# Patient Record
Sex: Female | Born: 1950
Health system: Southern US, Community
[De-identification: ages and names within clinical notes are randomized; demographics above are authoritative.]

## PROBLEM LIST (undated history)

## (undated) DIAGNOSIS — I10 Essential (primary) hypertension: Secondary | ICD-10-CM

## (undated) DIAGNOSIS — D649 Anemia, unspecified: Secondary | ICD-10-CM

## (undated) DIAGNOSIS — F419 Anxiety disorder, unspecified: Secondary | ICD-10-CM

## (undated) DIAGNOSIS — F32A Depression, unspecified: Secondary | ICD-10-CM

## (undated) DIAGNOSIS — E785 Hyperlipidemia, unspecified: Secondary | ICD-10-CM

## (undated) DIAGNOSIS — I2510 Atherosclerotic heart disease of native coronary artery without angina pectoris: Secondary | ICD-10-CM

## (undated) DIAGNOSIS — N183 Chronic kidney disease, stage 3 unspecified: Secondary | ICD-10-CM

## (undated) DIAGNOSIS — M1990 Unspecified osteoarthritis, unspecified site: Secondary | ICD-10-CM

## (undated) DIAGNOSIS — I5189 Other ill-defined heart diseases: Secondary | ICD-10-CM

## (undated) DIAGNOSIS — R011 Cardiac murmur, unspecified: Secondary | ICD-10-CM

## (undated) DIAGNOSIS — I319 Disease of pericardium, unspecified: Secondary | ICD-10-CM

## (undated) DIAGNOSIS — Z87442 Personal history of urinary calculi: Secondary | ICD-10-CM

## (undated) DIAGNOSIS — G4700 Insomnia, unspecified: Secondary | ICD-10-CM

## (undated) HISTORY — DX: Hypercalcemia: E83.52

## (undated) HISTORY — DX: Hyperlipidemia, unspecified: E78.5

## (undated) HISTORY — DX: Atherosclerotic heart disease of native coronary artery without angina pectoris: I25.10

## (undated) HISTORY — DX: Unspecified osteoarthritis, unspecified site: M19.90

## (undated) HISTORY — PX: EYE SURGERY: SHX253

## (undated) HISTORY — DX: Chronic kidney disease, stage 3 unspecified: N18.30

## (undated) HISTORY — DX: Disease of pericardium, unspecified: I31.9

## (undated) HISTORY — DX: Other ill-defined heart diseases: I51.89

## (undated) HISTORY — DX: Insomnia, unspecified: G47.00

## (undated) HISTORY — PX: TONSILLECTOMY: SUR1361

## (undated) HISTORY — DX: Anxiety disorder, unspecified: F41.9

## (undated) HISTORY — PX: LAPAROSCOPIC ENDOMETRIOSIS FULGURATION: SUR769

---
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
## 1979-03-18 HISTORY — PX: VAGINAL HYSTERECTOMY: SUR661

## 1989-03-17 HISTORY — PX: OTHER SURGICAL HISTORY: SHX169

## 1995-03-18 HISTORY — PX: BREAST CYST EXCISION: SHX579

## 1998-06-05 ENCOUNTER — Other Ambulatory Visit: Admission: RE | Admit: 1998-06-05 | Discharge: 1998-06-05 | Payer: Self-pay | Admitting: Gynecology

## 1999-07-02 ENCOUNTER — Other Ambulatory Visit: Admission: RE | Admit: 1999-07-02 | Discharge: 1999-07-02 | Payer: Self-pay | Admitting: Gynecology

## 2000-07-14 ENCOUNTER — Other Ambulatory Visit: Admission: RE | Admit: 2000-07-14 | Discharge: 2000-07-14 | Payer: Self-pay | Admitting: Gynecology

## 2001-09-08 ENCOUNTER — Other Ambulatory Visit: Admission: RE | Admit: 2001-09-08 | Discharge: 2001-09-08 | Payer: Self-pay | Admitting: Gynecology

## 2001-09-22 ENCOUNTER — Encounter: Admission: RE | Admit: 2001-09-22 | Discharge: 2001-09-22 | Payer: Self-pay | Admitting: Gynecology

## 2001-09-22 ENCOUNTER — Encounter: Payer: Self-pay | Admitting: Gynecology

## 2002-09-29 ENCOUNTER — Other Ambulatory Visit: Admission: RE | Admit: 2002-09-29 | Discharge: 2002-09-29 | Payer: Self-pay | Admitting: Gynecology

## 2003-09-26 ENCOUNTER — Other Ambulatory Visit: Admission: RE | Admit: 2003-09-26 | Discharge: 2003-09-26 | Payer: Self-pay | Admitting: Gynecology

## 2003-11-23 ENCOUNTER — Encounter: Admission: RE | Admit: 2003-11-23 | Discharge: 2003-11-23 | Payer: Self-pay | Admitting: Gynecology

## 2004-10-14 ENCOUNTER — Other Ambulatory Visit: Admission: RE | Admit: 2004-10-14 | Discharge: 2004-10-14 | Payer: Self-pay | Admitting: Gynecology

## 2005-06-16 ENCOUNTER — Encounter: Admission: RE | Admit: 2005-06-16 | Discharge: 2005-06-16 | Payer: Self-pay | Admitting: Gynecology

## 2005-11-06 ENCOUNTER — Other Ambulatory Visit: Admission: RE | Admit: 2005-11-06 | Discharge: 2005-11-06 | Payer: Self-pay | Admitting: Gynecology

## 2008-02-04 ENCOUNTER — Encounter: Admission: RE | Admit: 2008-02-04 | Discharge: 2008-02-04 | Payer: Self-pay | Admitting: Gynecology

## 2008-02-16 ENCOUNTER — Other Ambulatory Visit: Admission: RE | Admit: 2008-02-16 | Discharge: 2008-02-16 | Payer: Self-pay | Admitting: Gynecology

## 2008-02-18 ENCOUNTER — Encounter: Admission: RE | Admit: 2008-02-18 | Discharge: 2008-02-18 | Payer: Self-pay | Admitting: Gynecology

## 2009-03-26 ENCOUNTER — Encounter: Admission: RE | Admit: 2009-03-26 | Discharge: 2009-03-26 | Payer: Self-pay | Admitting: Gynecology

## 2010-04-23 ENCOUNTER — Other Ambulatory Visit: Payer: Self-pay | Admitting: Gynecology

## 2010-04-23 DIAGNOSIS — Z1231 Encounter for screening mammogram for malignant neoplasm of breast: Secondary | ICD-10-CM

## 2010-05-13 ENCOUNTER — Ambulatory Visit
Admission: RE | Admit: 2010-05-13 | Discharge: 2010-05-13 | Disposition: A | Discharge status: Home / Self Care | Payer: 59 | Source: Ambulatory Visit | Attending: Gynecology | Admitting: Gynecology

## 2010-05-13 DIAGNOSIS — Z1231 Encounter for screening mammogram for malignant neoplasm of breast: Secondary | ICD-10-CM

## 2010-08-13 ENCOUNTER — Other Ambulatory Visit (INDEPENDENT_AMBULATORY_CARE_PROVIDER_SITE_OTHER): Payer: Self-pay | Admitting: Surgery

## 2010-08-26 ENCOUNTER — Ambulatory Visit (HOSPITAL_COMMUNITY): Payer: 59

## 2010-08-26 ENCOUNTER — Encounter (HOSPITAL_COMMUNITY)
Admission: RE | Admit: 2010-08-26 | Discharge: 2010-08-26 | Disposition: A | Discharge status: Home / Self Care | Payer: 59 | Source: Ambulatory Visit | Attending: Surgery | Admitting: Surgery

## 2010-08-26 DIAGNOSIS — E213 Hyperparathyroidism, unspecified: Secondary | ICD-10-CM | POA: Insufficient documentation

## 2010-08-26 MED ORDER — TECHNETIUM TC 99M SESTAMIBI - CARDIOLITE
25.0000 | Freq: Once | INTRAVENOUS | Status: AC | PRN
  Administered 2010-08-26: 08:00:00 25 via INTRAVENOUS

## 2010-09-12 ENCOUNTER — Telehealth (INDEPENDENT_AMBULATORY_CARE_PROVIDER_SITE_OTHER): Payer: Self-pay | Admitting: Surgery

## 2010-09-16 ENCOUNTER — Telehealth (INDEPENDENT_AMBULATORY_CARE_PROVIDER_SITE_OTHER): Payer: Self-pay | Admitting: Surgery

## 2010-09-16 NOTE — Telephone Encounter (Signed)
Telephone call today to the patient. I reviewed the results of her nuclear medicine scan and her recent laboratory studies. All of these are consistent with primary hyperparathyroidism. Nuclear medicine study localized a parathyroid adenoma to the left inferior position. After discussion, the patient would like to proceed with minimally invasive surgery. I believe we can perform this as an outpatient. I will pass the orders along to the scheduling staff and we will arrange for surgery in the near future.

## 2010-10-28 ENCOUNTER — Other Ambulatory Visit (INDEPENDENT_AMBULATORY_CARE_PROVIDER_SITE_OTHER): Payer: Self-pay | Admitting: Surgery

## 2010-10-28 ENCOUNTER — Ambulatory Visit (HOSPITAL_COMMUNITY)
Admission: RE | Admit: 2010-10-28 | Discharge: 2010-10-28 | Disposition: A | Discharge status: Home / Self Care | Payer: 59 | Source: Ambulatory Visit | Attending: Surgery | Admitting: Surgery

## 2010-10-28 ENCOUNTER — Encounter (HOSPITAL_COMMUNITY): Payer: 59

## 2010-10-28 DIAGNOSIS — Z01810 Encounter for preprocedural cardiovascular examination: Secondary | ICD-10-CM | POA: Insufficient documentation

## 2010-10-28 DIAGNOSIS — Z01818 Encounter for other preprocedural examination: Secondary | ICD-10-CM

## 2010-10-28 DIAGNOSIS — R9431 Abnormal electrocardiogram [ECG] [EKG]: Secondary | ICD-10-CM | POA: Insufficient documentation

## 2010-10-28 DIAGNOSIS — Z01812 Encounter for preprocedural laboratory examination: Secondary | ICD-10-CM | POA: Insufficient documentation

## 2010-10-28 LAB — CBC
HCT: 41.6 % (ref 36.0–46.0)
Hemoglobin: 13.5 g/dL (ref 12.0–15.0)
MCH: 31.8 pg (ref 26.0–34.0)
MCHC: 32.5 g/dL (ref 30.0–36.0)
MCV: 97.9 fL (ref 78.0–100.0)
Platelets: 216 10*3/uL (ref 150–400)
RBC: 4.25 MIL/uL (ref 3.87–5.11)
RDW: 15.4 % (ref 11.5–15.5)
WBC: 12 10*3/uL — ABNORMAL HIGH (ref 4.0–10.5)

## 2010-10-28 LAB — URINE MICROSCOPIC-ADD ON

## 2010-10-28 LAB — URINALYSIS, ROUTINE W REFLEX MICROSCOPIC
Bilirubin Urine: NEGATIVE
Glucose, UA: NEGATIVE mg/dL
Hgb urine dipstick: NEGATIVE
Ketones, ur: NEGATIVE mg/dL
Nitrite: NEGATIVE
Protein, ur: NEGATIVE mg/dL
Specific Gravity, Urine: 1.022 (ref 1.005–1.030)
Urobilinogen, UA: 1 mg/dL (ref 0.0–1.0)
pH: 7.5 (ref 5.0–8.0)

## 2010-10-28 LAB — BASIC METABOLIC PANEL
BUN: 15 mg/dL (ref 6–23)
CO2: 29 mEq/L (ref 19–32)
Calcium: 11.9 mg/dL — ABNORMAL HIGH (ref 8.4–10.5)
Chloride: 108 mEq/L (ref 96–112)
Creatinine, Ser: 0.7 mg/dL (ref 0.50–1.10)
GFR calc Af Amer: >60 mL/min (ref >60)
GFR calc non Af Amer: >60 mL/min (ref >60)
Glucose, Bld: 101 mg/dL — ABNORMAL HIGH (ref 70–99)
Potassium: 4 mEq/L (ref 3.5–5.1)
Sodium: 144 mEq/L (ref 135–145)

## 2010-10-28 LAB — DIFFERENTIAL
Basophils Absolute: 0.1 10*3/uL (ref 0.0–0.1)
Basophils Relative: 0 % (ref 0–1)
Eosinophils Absolute: 0.3 10*3/uL (ref 0.0–0.7)
Eosinophils Relative: 3 % (ref 0–5)
Lymphocytes Relative: 30 % (ref 12–46)
Lymphs Abs: 3.5 10*3/uL (ref 0.7–4.0)
Monocytes Absolute: 1.1 10*3/uL — ABNORMAL HIGH (ref 0.1–1.0)
Monocytes Relative: 9 % (ref 3–12)
Neutro Abs: 7 10*3/uL (ref 1.7–7.7)
Neutrophils Relative %: 58 % (ref 43–77)

## 2010-10-28 LAB — PROTIME-INR
INR: 0.92 (ref 0.00–1.49)
Prothrombin Time: 12.6 seconds (ref 11.6–15.2)

## 2010-10-28 LAB — SURGICAL PCR SCREEN
MRSA, PCR: NEGATIVE
Staphylococcus aureus: NEGATIVE

## 2010-11-01 ENCOUNTER — Ambulatory Visit (HOSPITAL_COMMUNITY)
Admission: RE | Admit: 2010-11-01 | Discharge: 2010-11-01 | Disposition: A | Discharge status: Home / Self Care | Payer: 59 | Source: Ambulatory Visit | Attending: Surgery | Admitting: Surgery

## 2010-11-01 ENCOUNTER — Other Ambulatory Visit (INDEPENDENT_AMBULATORY_CARE_PROVIDER_SITE_OTHER): Payer: Self-pay | Admitting: Surgery

## 2010-11-01 DIAGNOSIS — Z01818 Encounter for other preprocedural examination: Secondary | ICD-10-CM | POA: Insufficient documentation

## 2010-11-01 DIAGNOSIS — M810 Age-related osteoporosis without current pathological fracture: Secondary | ICD-10-CM | POA: Insufficient documentation

## 2010-11-01 DIAGNOSIS — E210 Primary hyperparathyroidism: Secondary | ICD-10-CM

## 2010-11-01 HISTORY — PX: PARATHYROIDECTOMY: SHX19

## 2010-11-04 NOTE — Op Note (Signed)
  NAMEAMARII, Maureen NO.:  [id number]  MEDICAL RECORD NO.:  [id number]  LOCATION:  DAYL                         FACILITY:  [facility]  PHYSICIAN:  Velora Heckler, MD      DATE OF BIRTH:  01-Feb-1951  DATE OF PROCEDURE:11/01/2010                               OPERATIVE REPORT   PREOPERATIVE DIAGNOSIS:  Primary hyperparathyroidism.  POSTOPERATIVE DIAGNOSIS:  Primary hyperparathyroidism.  PROCEDURE:  Left inferior minimally invasive parathyroidectomy.  SURGEON:  Velora Heckler, MD, FACS  ANESTHESIA:  General.  ESTIMATED BLOOD LOSS:  Minimal.  PREPARATION:  ChloraPrep.  COMPLICATIONS:  None.  INDICATIONS:  The patient is a 60 year old white female from [place], [place].  She was found to have evidence of osteoporosis on bone density scanning.  Laboratory studies showed an elevated serum calcium level ranging from 11.4 to 11.9.  Intact parathyroid hormone level was elevated at 162.  The patient underwent a nuclear medicine parathyroid scan, which localized a parathyroid adenoma to the left inferior position.  The patient now comes to surgery for parathyroidectomy.  BODY OF REPORT:  Procedure was done in OR #1 at the [facility]  The patient was brought to the operating room, placed in supine position on the operating room table.  Following administration of general anesthesia, the patient was positioned and then prepped and draped in the usual strict aseptic fashion.  After ascertaining that an adequate level of anesthesia had been achieved, a 3- cm neck incision was made anteriorly.  Dissection was carried into the subcutaneous tissues.  Subplatysmal flaps were elevated.  Left external jugular vein was divided between hemostats and ligated with 2-0 silk ties.  Strap muscles were incised in the midline and reflected to the left.  Left thyroid lobe was gently mobilized.  Venous tributaries were divided between medium Ligaclips.   Exploration in the tracheoesophageal groove reveals the recurrent laryngeal nerve, which was avoided and preserved.  Posterior to the nerve along the lateral aspect of the esophagus, is an enlarged parathyroid gland.  This was gently dissected out.  Vascular pedicles divided between small Ligaclips.  Gland was excised in its entirety.  It is submitted in its entirety to pathology for frozen section.  Pathologist confirms parathyroid tissue consistent with parathyroid adenoma.  Good hemostasis was achieved.  Surgicel was placed in the operative field.  Strap muscles were reapproximated in the midline with interrupted 3-0 Vicryl sutures.  Platysma was closed with interrupted 3- 0 Vicryl sutures.  Skin was anesthetized with local anesthetic.  Skin edges were reapproximated with a running 4-0 Monocryl subcuticular suture.  Wound was washed and dried and Benzoin and Steri-Strips were applied.  Sterile dressings were applied.  The patient was awakened from anesthesia and brought to the recovery room.  The patient tolerated the procedure well.   Velora Heckler, MD, FACS     TMG/MEDQ  D:  11/01/2010  T:  11/01/2010  Job:  [id number]  cc:   Leatha Gilding. Mezer, M.D. Fax: [phone]  Ancil Boozer, MD Fax: [phone]  Electronically Signed by Darnell Level MD on 11/04/2010 02:28:13 PM

## 2010-11-22 ENCOUNTER — Other Ambulatory Visit (INDEPENDENT_AMBULATORY_CARE_PROVIDER_SITE_OTHER): Payer: Self-pay | Admitting: Surgery

## 2010-11-22 LAB — CALCIUM: Calcium: 9.1 mg/dL (ref 8.4–10.5)

## 2010-11-26 ENCOUNTER — Encounter (INDEPENDENT_AMBULATORY_CARE_PROVIDER_SITE_OTHER): Payer: Self-pay | Admitting: Surgery

## 2010-11-27 ENCOUNTER — Encounter (INDEPENDENT_AMBULATORY_CARE_PROVIDER_SITE_OTHER): Payer: Self-pay | Admitting: Surgery

## 2010-11-27 ENCOUNTER — Ambulatory Visit (INDEPENDENT_AMBULATORY_CARE_PROVIDER_SITE_OTHER): Payer: 59 | Admitting: Surgery

## 2010-11-27 VITALS — BP 138/66 | HR 72

## 2010-11-27 DIAGNOSIS — E210 Primary hyperparathyroidism: Secondary | ICD-10-CM | POA: Insufficient documentation

## 2010-11-27 NOTE — Patient Instructions (Signed)
  COCOA BUTTER & VITAMIN E CREAM  ([employer] or other brand)  Apply cocoa butter/vitamin E cream to your incision 2 - 3 times daily.  Massage cream into incision for one minute with each application.  Use sunscreen (50 SPF or higher) for first 6 months after surgery.  You may substitute Mederma or other scar reducing creams as desired.   

## 2010-11-27 NOTE — Progress Notes (Signed)
Visit Diagnoses: 1. Hyperparathyroidism, primary     HISTORY: Patient returns for followup having undergone minimally invasive parathyroidectomy 3 weeks ago. Followup serum calcium level is normal at 9.1. Patient has no specific complaints.   EXAM: Surgical wound is healing nicely without complication. Minimal soft tissue swelling. His quality is normal.   IMPRESSION: Primary hyperparathyroidism, normalization of serum calcium level following parathyroidectomy   PLAN: Patient will begin applying topical creams to her incision. She will have a calcium level and intact PTH level checked in 5 weeks. She will return in 6 weeks for a final wound check.   Velora Heckler, MD, FACS General & Endocrine Surgery [facility]

## 2011-01-03 ENCOUNTER — Other Ambulatory Visit (INDEPENDENT_AMBULATORY_CARE_PROVIDER_SITE_OTHER): Payer: Self-pay | Admitting: Surgery

## 2011-01-06 LAB — PTH, INTACT AND CALCIUM
Calcium, Total (PTH): 10 mg/dL (ref 8.4–10.5)
PTH: 40.4 pg/mL (ref 14.0–72.0)

## 2011-01-07 ENCOUNTER — Encounter (INDEPENDENT_AMBULATORY_CARE_PROVIDER_SITE_OTHER): Payer: Self-pay | Admitting: Surgery

## 2011-01-08 ENCOUNTER — Ambulatory Visit (INDEPENDENT_AMBULATORY_CARE_PROVIDER_SITE_OTHER): Payer: 59 | Admitting: Surgery

## 2011-01-08 ENCOUNTER — Encounter (INDEPENDENT_AMBULATORY_CARE_PROVIDER_SITE_OTHER): Payer: Self-pay | Admitting: Surgery

## 2011-01-08 VITALS — BP 136/80 | HR 60 | Temp 97.2°F | Resp 20 | Ht 64.0 in | Wt 125.5 lb

## 2011-01-08 DIAGNOSIS — E210 Primary hyperparathyroidism: Secondary | ICD-10-CM

## 2011-01-08 NOTE — Patient Instructions (Signed)
  COCOA BUTTER & VITAMIN E CREAM  ([employer] or other brand)  Apply cocoa butter/vitamin E cream to your incision 2 - 3 times daily.  Massage cream into incision for one minute with each application.  Use sunscreen (50 SPF or higher) for first 6 months after surgery.  You may substitute Mederma or other scar reducing creams as desired.   

## 2011-01-08 NOTE — Progress Notes (Signed)
Visit Diagnoses: 1. Hyperparathyroidism, primary     HISTORY: Patient returns for final postoperative visit having undergone minimally invasive parathyroidectomy. Final laboratory showed an intact PTH level of 40 and a total serum calcium level of 10.0. These are down from her preoperative levels of PTH 162 and calcium of 11.9.   EXAM: Surgical wound is nicely healed. No complication. Cosmetic result is acceptable.   IMPRESSION: Status post minimally invasive parathyroidectomy for primary hyperparathyroidism   PLAN: Patient will continue to apply topical creams to her incision. She will return to see me as needed. She should have an annual calcium level determination as followup.   Velora Heckler, MD, FACS General & Endocrine Surgery [facility]

## 2011-02-20 ENCOUNTER — Other Ambulatory Visit: Payer: Self-pay | Admitting: Surgery

## 2013-11-16 ENCOUNTER — Other Ambulatory Visit: Payer: Self-pay | Admitting: Gynecology

## 2013-11-17 LAB — CYTOLOGY - PAP

## 2014-06-26 ENCOUNTER — Ambulatory Visit
Admission: RE | Admit: 2014-06-26 | Discharge: 2014-06-26 | Disposition: A | Discharge status: Home / Self Care | Payer: BLUE CROSS/BLUE SHIELD | Source: Ambulatory Visit | Attending: Family Medicine | Admitting: Family Medicine

## 2014-06-26 ENCOUNTER — Other Ambulatory Visit: Payer: Self-pay | Admitting: Family Medicine

## 2014-06-26 DIAGNOSIS — R05 Cough: Secondary | ICD-10-CM

## 2014-06-26 DIAGNOSIS — R059 Cough, unspecified: Secondary | ICD-10-CM

## 2014-12-05 ENCOUNTER — Other Ambulatory Visit: Payer: Self-pay

## 2014-12-11 ENCOUNTER — Ambulatory Visit
Admission: RE | Admit: 2014-12-11 | Discharge: 2014-12-11 | Disposition: A | Discharge status: Home / Self Care | Payer: BLUE CROSS/BLUE SHIELD | Source: Ambulatory Visit | Attending: Physical Medicine and Rehabilitation | Admitting: Physical Medicine and Rehabilitation

## 2014-12-11 ENCOUNTER — Other Ambulatory Visit: Payer: Self-pay | Admitting: Physical Medicine and Rehabilitation

## 2014-12-11 DIAGNOSIS — M25511 Pain in right shoulder: Secondary | ICD-10-CM

## 2014-12-11 DIAGNOSIS — M79642 Pain in left hand: Secondary | ICD-10-CM

## 2014-12-11 DIAGNOSIS — M79641 Pain in right hand: Secondary | ICD-10-CM

## 2014-12-11 DIAGNOSIS — M25512 Pain in left shoulder: Secondary | ICD-10-CM

## 2015-01-02 ENCOUNTER — Encounter: Payer: Self-pay | Admitting: Acute Care

## 2015-01-24 ENCOUNTER — Encounter: Payer: Self-pay | Admitting: Acute Care

## 2015-04-27 ENCOUNTER — Telehealth: Payer: Self-pay | Admitting: Acute Care

## 2015-04-27 NOTE — Telephone Encounter (Signed)
Patient self referred herself for this program.  We have spoke to patient 4x's. She keeps pushing this appt back each month for [place] to contact her next month. Informed patient to contact us when she was ready and available to make the appt. Patient was given our number and My name to make the appt.   Nothing further needed at this time.

## 2015-08-06 ENCOUNTER — Encounter: Payer: Self-pay | Admitting: Cardiovascular Disease

## 2015-08-09 ENCOUNTER — Encounter: Payer: Self-pay | Admitting: Cardiovascular Disease

## 2015-08-09 ENCOUNTER — Ambulatory Visit (INDEPENDENT_AMBULATORY_CARE_PROVIDER_SITE_OTHER): Payer: BLUE CROSS/BLUE SHIELD | Admitting: Cardiovascular Disease

## 2015-08-09 VITALS — BP 160/90 | HR 89 | Ht 64.0 in | Wt 132.2 lb

## 2015-08-09 DIAGNOSIS — R0600 Dyspnea, unspecified: Secondary | ICD-10-CM

## 2015-08-09 DIAGNOSIS — Z7689 Persons encountering health services in other specified circumstances: Secondary | ICD-10-CM

## 2015-08-09 DIAGNOSIS — Z7189 Other specified counseling: Secondary | ICD-10-CM

## 2015-08-09 DIAGNOSIS — Z8249 Family history of ischemic heart disease and other diseases of the circulatory system: Secondary | ICD-10-CM | POA: Diagnosis not present

## 2015-08-09 DIAGNOSIS — R0789 Other chest pain: Secondary | ICD-10-CM

## 2015-08-09 NOTE — Patient Instructions (Addendum)
Medication Instructions:  Your physician recommends that you continue on your current medications as directed. Please refer to the Current Medication list given to you today.  1-STOP your Simvastatin for 1 month and then resume  Labwork: NONE  Testing/Procedures: Your physician has requested that you have an echocardiogram. Echocardiography is a painless test that uses sound waves to create images of your heart. It provides your doctor with information about the size and shape of your heart and how well your heart's chambers and valves are working. This procedure takes approximately one hour. There are no restrictions for this procedure.  Your physician has requested that you have en exercise stress myoview. For further information please visit HugeFiesta.[email or web address]. Please follow instruction sheet, as given.  Follow-Up: Your physician wants you to follow-up as needed with Dr. Johnsie Cancel.    If you need a refill on your cardiac medications before your next appointment, please call your pharmacy.

## 2015-08-09 NOTE — Progress Notes (Signed)
Patient ID: Maureen Chung, female   DOB: 1951/02/17, 65 y.o.   MRN: [id number]     Cardiology Office Note   Date:  08/09/2015   ID:  Maureen Chung, DOB Jul 15, 1950, MRN [id number]  PCP:  London Pepper, MD  Cardiologist:   Jenkins Rouge, MD   Chief Complaint  Patient presents with  . Establish Care    lack of energy & diarrhea for 3 months, per pt      History of Present Illness: Maureen Chung is a 65 y.o. female who presents for dyspnea Current 1 ppd smoker. Family history of premature CAD.  She has been having constitutional symptoms For a few months Malaise, fatiugue dyspnea some LE edema. Diarrhea  No PND , orthopnea and no syncope or palpitations. She has had some atypical chest pains Sharp fleeting radiate to left arm. Has not tried to quit smoking in long time CXR last year ok Was recommended to enroll in lung cancer screening program   Lab reviewe [facility]: Hct 37.3 Cr .91 TSH 1.97 LDL 113    Past Medical History  Diagnosis Date  . Arthritis   . Osteoporosis   . Hyperparathyroidism   . Hypercalcemia   . Hyperlipidemia   . Pericarditis   . Insomnia   . Breast cancer ([place]) 2002    s/p lumpectomy and radiation Dr. Margot Chimes  . Anxiety     Past Surgical History  Procedure Laterality Date  . Tonsilectomy, adenoidectomy, bilateral myringotomy and tubes  1969  . Laparoscopic endometriosis fulguration  1989, 1995  . Child birth  89  . Breast cyst excision  1997    benign  . Parathyroidectomy  11/01/10  . Vaginal hysterectomy  1981    without ovaries     Current Outpatient Prescriptions  Medication Sig Dispense Refill  . amphetamine-dextroamphetamine (ADDERALL) 20 MG tablet Take 20 mg by mouth daily.    Marland Kitchen ascorbic acid (VITAMIN C) 500 MG/5ML syrup Take 3,000 mg by mouth daily.    . Cholecalciferol (VITAMIN D3) 2000 units TABS Take 4,000 mg by mouth daily.    . Magnesium Oxide 500 MG CAPS Take 500 mg by mouth daily.    . Naftifine HCl 2 % CREA Apply 1  application topically daily.    . sertraline (ZOLOFT) 100 MG tablet Take 200 mg by mouth daily.    . simvastatin (ZOCOR) 10 MG tablet Take 10 mg by mouth daily.       No current facility-administered medications for this visit.    Allergies:   Codeine sulfate and Latex    Social History:  The patient  reports that she has been smoking Cigarettes.  She has a 35 pack-year smoking history. She has never used smokeless tobacco. She reports that she drinks alcohol. She reports that she does not use illicit drugs.   Family History:  The patient's family history includes Bladder Cancer in her brother; Cancer in her mother; Coronary artery disease in her brother; Diabetes in her father; Heart disease in her brother and father.    ROS:  Please see the history of present illness.   Otherwise, review of systems are positive for none.   All other systems are reviewed and negative.    PHYSICAL EXAM: VS:  BP 160/90 mmHg  Pulse 89  Ht 5\' 4"  (1.626 m)  Wt 59.966 kg (132 lb 3.2 oz)  BMI 22.68 kg/m2  SpO2 96% , BMI Body mass index is 22.68 kg/(m^2). Affect appropriate Healthy:  appears  stated age 34: normal Neck supple with no adenopathy JVP normal no bruits no thyromegaly Lungs clear with no wheezing and good diaphragmatic motion Heart:  S1/S2 no murmur, no rub, gallop or click PMI normal Abdomen: benighn, BS positve, no tenderness, no AAA no bruit.  No HSM or HJR Distal pulses intact with no bruits No edema Neuro non-focal Skin warm and dry No muscular weakness    EKG:   10/28/10 SR rate 75 LAE nonspecific ST/T wave changes  08/09/15 SR rate 81 biatrial enlargement normal    Recent Labs: No results found for requested labs within last 365 days.    Lipid Panel No results found for: CHOL, TRIG, HDL, CHOLHDL, VLDL, LDLCALC, LDLDIRECT    Wt Readings from Last 3 Encounters:  08/09/15 59.966 kg (132 lb 3.2 oz)  01/08/11 56.926 kg (125 lb 8 oz)      Other studies  Reviewed: Additional studies/ records that were reviewed today include: Primary notes labs and CXR.    ASSESSMENT AND PLAN:  1.  Chest Pain: atypical ECG ok smoker with family history f/u exercise myovue 2. Dyspnea related to smoking low dose CT for lung cancer screening should be arranged by primary  With malaise and fatigue check echo for RV/LV function 3. Chol:  Stop statin for a month and see if constitutional symptoms improve 4. ADHD: continue adderall    Current medicines are reviewed at length with the patient today.  The patient does not have concerns regarding medicines.  The following changes have been made:  Stop statin for a month   Labs/ tests ordered today include:  Echo and Ex Myovue   Orders Placed This Encounter  Procedures  . Myocardial Perfusion Imaging  . EKG 12-Lead  . ECHOCARDIOGRAM COMPLETE     Disposition:   FU with me PRN      Signed, Jenkins Rouge, MD  08/09/2015 3:07 PM    [facility] [place] [place], [place], [place]  [address] Phone: [phone]; Fax: [phone]

## 2015-08-16 ENCOUNTER — Encounter: Payer: Self-pay | Admitting: Cardiovascular Disease

## 2015-08-17 ENCOUNTER — Encounter: Payer: Self-pay | Admitting: Cardiovascular Disease

## 2015-08-24 ENCOUNTER — Other Ambulatory Visit: Payer: Self-pay | Admitting: Gastroenterology

## 2015-08-27 ENCOUNTER — Telehealth (HOSPITAL_COMMUNITY): Payer: Self-pay | Admitting: *Deleted

## 2015-08-27 NOTE — Telephone Encounter (Signed)
Patient given detailed instructions per Myocardial Perfusion Study Information Sheet for the test on 08/31/15. Patient notified to arrive 15 minutes early and that it is imperative to arrive on time for appointment to keep from having the test rescheduled.  If you need to cancel or reschedule your appointment, please call the office within 24 hours of your appointment. Failure to do so may result in a cancellation of your appointment, and a $50 no show fee. Patient verbalized understanding. Sofija Antwi J Payne Garske, RN  

## 2015-08-31 ENCOUNTER — Other Ambulatory Visit: Payer: Self-pay

## 2015-08-31 ENCOUNTER — Ambulatory Visit (HOSPITAL_COMMUNITY): Payer: BLUE CROSS/BLUE SHIELD | Attending: Internal Medicine

## 2015-08-31 ENCOUNTER — Ambulatory Visit (HOSPITAL_BASED_OUTPATIENT_CLINIC_OR_DEPARTMENT_OTHER): Payer: BLUE CROSS/BLUE SHIELD

## 2015-08-31 DIAGNOSIS — R0600 Dyspnea, unspecified: Secondary | ICD-10-CM | POA: Insufficient documentation

## 2015-08-31 DIAGNOSIS — Z720 Tobacco use: Secondary | ICD-10-CM | POA: Diagnosis not present

## 2015-08-31 DIAGNOSIS — I253 Aneurysm of heart: Secondary | ICD-10-CM | POA: Insufficient documentation

## 2015-08-31 DIAGNOSIS — R5383 Other fatigue: Secondary | ICD-10-CM | POA: Diagnosis not present

## 2015-08-31 DIAGNOSIS — R0602 Shortness of breath: Secondary | ICD-10-CM | POA: Diagnosis not present

## 2015-08-31 DIAGNOSIS — I340 Nonrheumatic mitral (valve) insufficiency: Secondary | ICD-10-CM | POA: Diagnosis not present

## 2015-08-31 DIAGNOSIS — I5189 Other ill-defined heart diseases: Secondary | ICD-10-CM | POA: Insufficient documentation

## 2015-08-31 DIAGNOSIS — Z8249 Family history of ischemic heart disease and other diseases of the circulatory system: Secondary | ICD-10-CM | POA: Diagnosis not present

## 2015-08-31 DIAGNOSIS — R0789 Other chest pain: Secondary | ICD-10-CM | POA: Diagnosis not present

## 2015-08-31 DIAGNOSIS — R9439 Abnormal result of other cardiovascular function study: Secondary | ICD-10-CM | POA: Insufficient documentation

## 2015-08-31 LAB — ECHOCARDIOGRAM COMPLETE
Ao-asc: 26 cm
E decel time: 278 msec
E/e' ratio: 10.75
FS: 27 % — AB (ref 28–44)
IVS/LV PW RATIO, ED: 1.15
LA ID, A-P, ES: 38 mm
LA diam end sys: 38 mm
LA diam index: 2.3 cm/m2
LA vol A4C: 36 ml
LA vol index: 28.5 mL/m2
LA vol: 47 mL
LV E/e' medial: 10.75
LV E/e'average: 10.75
LV PW d: 8.95 mm — AB (ref 0.6–1.1)
LV dias vol index: 43 mL/m2
LV dias vol: 71 mL (ref 46–106)
LV e' LATERAL: 7.02 cm/s
LV sys vol index: 20 mL/m2
LV sys vol: 33 mL (ref 14–42)
LVOT SV: 61 mL
LVOT VTI: 21.4 cm
LVOT area: 2.84 cm2
LVOT diameter: 19 mm
LVOT peak grad rest: 3 mmHg
LVOT peak vel: 81.2 cm/s
Lateral S' vel: 11.2 cm/s
MV Dec: 278
MV Peak grad: 2 mmHg
MV pk A vel: 102 m/s
MV pk E vel: 75.5 m/s
Simpson's disk: 54
Stroke v: 38 ml
TDI e' lateral: 7.02
TDI e' medial: 4.39

## 2015-08-31 LAB — MYOCARDIAL PERFUSION IMAGING
Estimated workload: 7 METS
Exercise duration (min): 6 min
Exercise duration (sec): 0 s
LV dias vol: 91 mL (ref 46–106)
LV sys vol: 45 mL
MPHR: 157 {beats}/min
Peak HR: 153 {beats}/min
Percent HR: 97 %
RATE: 0.26
Rest HR: 65 {beats}/min
SDS: 0
SRS: 0
SSS: 0
TID: 0.95

## 2015-08-31 MED ORDER — TECHNETIUM TC 99M TETROFOSMIN IV KIT
10.1000 | PACK | Freq: Once | INTRAVENOUS | Status: AC | PRN
Start: 2015-08-31 — End: 2015-08-31
  Administered 2015-08-31: 10.1 via INTRAVENOUS
  Filled 2015-08-31: qty 10

## 2015-08-31 MED ORDER — TECHNETIUM TC 99M TETROFOSMIN IV KIT
31.6000 | PACK | Freq: Once | INTRAVENOUS | Status: AC | PRN
  Administered 2015-08-31: 32 via INTRAVENOUS
  Filled 2015-08-31: qty 32

## 2015-09-04 ENCOUNTER — Telehealth: Payer: Self-pay

## 2015-09-04 NOTE — Telephone Encounter (Signed)
-----   Message from Josue Hector, MD sent at 08/31/2015  3:51 PM EDT ----- Low risk scan small area of apical ischemia call in SL nitro for chest pain f/u with me next available

## 2015-09-04 NOTE — Telephone Encounter (Signed)
Left message to call back  

## 2015-09-12 MED ORDER — NITROGLYCERIN 0.4 MG SL SUBL: 0.4000 mg | SUBLINGUAL_TABLET | SUBLINGUAL | Status: DC | PRN

## 2015-09-12 NOTE — Telephone Encounter (Signed)
Pt returning call to Pam-pls call [phone]

## 2015-09-12 NOTE — Telephone Encounter (Signed)
Patient aware of stress test and echo results. Patient has follow-up appointment 10/22/15 which is next available with Dr. Johnsie Cancel.

## 2015-09-26 ENCOUNTER — Other Ambulatory Visit: Payer: Self-pay | Admitting: Acute Care

## 2015-09-26 DIAGNOSIS — F17210 Nicotine dependence, cigarettes, uncomplicated: Secondary | ICD-10-CM

## 2015-10-01 ENCOUNTER — Telehealth: Payer: Self-pay | Admitting: Cardiovascular Disease

## 2015-10-01 NOTE — Telephone Encounter (Signed)
New message      Pt has an appt early august.  We called her and gave her test results.  Pt is worried and and has questions. Please call pt

## 2015-10-01 NOTE — Telephone Encounter (Signed)
Spoke with the patient. States she was recently diagnosed with mitral regurgitation and is "stressing out about it".  She is worried and thinks about it all the time. Several days last week she became sweaty and couldn't get her breath during activities--unloading the dishwasher, doing laundry, gardening. She reports chest pressure at times and has taken NTG once which helped.  PCP recently prescribed clonazepam for her anxiety.  Feet and ankles swell by the end of the day, better in the mornings.  She would like to see Dr. Johnsie Cancel sooner than 10/22/15 if possible.   Advised I would let his primary nurse know, and also scheduling if there is a cancellation.  Advised pt to keep a list of questions/concerns to bring with her or she can call back to discuss if needed.  She is appreciative for the information provided.

## 2015-10-03 ENCOUNTER — Ambulatory Visit: Payer: BLUE CROSS/BLUE SHIELD | Admitting: Nurse Practitioner

## 2015-10-03 NOTE — Telephone Encounter (Signed)
Called patient and moved her appointment to 10/09/15. Patient was very thankful and will be here on Tuesday at 9:30.

## 2015-10-08 NOTE — Progress Notes (Signed)
Patient ID: Maureen Chung, female   DOB: 06-23-1950, 65 y.o.   MRN: [id number]     Cardiology Office Note   Date:  10/09/2015   ID:  Maureen Chung, DOB March 04, 1951, MRN [id number]  PCP:  London Pepper, MD  Cardiologist:   Jenkins Rouge, MD   Chief Complaint  Patient presents with  . Follow-up      History of Present Illness: Maureen Chung is a 65 y.o. female who presents for dyspnea Current 1 ppd smoker. Family history of premature CAD.  She has been having constitutional symptoms  For a few months Malaise, fatiugue dyspnea some LE edema. Diarrhea  No PND , orthopnea and no syncope or palpitations. She has had some atypical chest pains Sharp fleeting radiate to left arm. Has not tried to quit smoking in long time CXR last year ok Was recommended to enroll in lung cancer screening program   Lab reviewe [facility]: Hct 37.3 Cr .91 TSH 1.97 LDL 113  Echo 08/31/15 Impressions:  - LVEF 50-55%, normal wall thickness, normal wall motion, diastolic   dysfunction, indeterminate LV filling pressure, mildly thickened   leaflets with elongated leaflet tips, mild to moderate mitral   regurgitation, normal LA size, aneurysmal IAS - cannot exclude   small PFO, normal IVC.  Myovue:  08/31/15 low risk    Study Highlights    Nuclear stress EF: 50%.  Upsloping ST segment depression ST segment depression of 1 mm was noted during stress in the V5, V6, III and aVF leads.  This is a low risk study.  The left ventricular ejection fraction is mildly decreased (45-54%).  Findings consistent with ischemia.   Small area of mild apical ischemia. ECG positive with 1 mm upsloping ST segment depression Surface images with EF 50% mild apical hypokinesis     Statin stopped last visit for constitutional symptoms  Feels like she may have more energy.  Continue to have SSCP has enrolled in lung cancer screeing Program.  Discussed options Given ongoing SSCP and abnormal myovue with risk  factors Favor cath Risks including stroke , MI bleeding and contrast reaction discussed Willing to proceed     Past Medical History:  Diagnosis Date  . Anxiety   . Arthritis   . Breast cancer ([place]) 2002   s/p lumpectomy and radiation Dr. Margot Chimes  . Hypercalcemia   . Hyperlipidemia   . Hyperparathyroidism   . Insomnia   . Osteoporosis   . Pericarditis     Past Surgical History:  Procedure Laterality Date  . BREAST CYST EXCISION  1997   benign  . child birth  75  . LAPAROSCOPIC ENDOMETRIOSIS FULGURATION  1989, 1995  . PARATHYROIDECTOMY  11/01/10  . TONSILECTOMY, ADENOIDECTOMY, BILATERAL MYRINGOTOMY AND TUBES  1969  . VAGINAL HYSTERECTOMY  1981   without ovaries     Current Outpatient Prescriptions  Medication Sig Dispense Refill  . amphetamine-dextroamphetamine (ADDERALL) 20 MG tablet Take 20 mg by mouth daily.    . Cholecalciferol (VITAMIN D3) 2000 units TABS Take 4,000 mg by mouth daily.    . Naftifine HCl 2 % CREA Apply 1 application topically daily.    . nitroGLYCERIN (NITROSTAT) 0.4 MG SL tablet Place 1 tablet (0.4 mg total) under the tongue every 5 (five) minutes as needed for chest pain. 25 tablet 3  . sertraline (ZOLOFT) 100 MG tablet Take 200 mg by mouth daily.     No current facility-administered medications for this visit.     Allergies:  Codeine sulfate and Latex    Social History:  The patient  reports that she has been smoking Cigarettes.  She has a 35.00 pack-year smoking history. She has never used smokeless tobacco. She reports that she drinks alcohol. She reports that she does not use drugs.   Family History:  The patient's family history includes Bladder Cancer in her brother; Cancer in her mother; Coronary artery disease in her brother; Diabetes in her father; Heart disease in her brother and father.    ROS:  Please see the history of present illness.   Otherwise, review of systems are positive for none.   All other systems are reviewed and  negative.    PHYSICAL EXAM: VS:  BP 140/68   Pulse 74   Ht 5\' 4"  (1.626 m)   Wt 131 lb 1.9 oz (59.5 kg)   BMI 22.51 kg/m  , BMI Body mass index is 22.51 kg/m. Affect appropriate Healthy:  appears stated age 31: normal Neck supple with no adenopathy JVP normal no bruits no thyromegaly Lungs clear with no wheezing and good diaphragmatic motion Heart:  S1/S2 no murmur, no rub, gallop or click PMI normal Abdomen: benighn, BS positve, no tenderness, no AAA no bruit.  No HSM or HJR Distal pulses intact with no bruits No edema Neuro non-focal Skin warm and dry No muscular weakness    EKG:   10/28/10 SR rate 75 LAE nonspecific ST/T wave changes  08/09/15 SR rate 81 biatrial enlargement normal    Recent Labs: No results found for requested labs within last 8760 hours.    Lipid Panel No results found for: CHOL, TRIG, HDL, CHOLHDL, VLDL, LDLCALC, LDLDIRECT    Wt Readings from Last 3 Encounters:  10/09/15 131 lb 1.9 oz (59.5 kg)  08/09/15 132 lb 3.2 oz (60 kg)  01/08/11 125 lb 8 oz (56.9 kg)      Other studies Reviewed: Additional studies/ records that were reviewed today include: Primary notes labs and CXR.    ASSESSMENT AND PLAN:  1.  Chest Pain:  Atypical abnormal myovue ongoing in smoker cath  Scheduled for next Thursday labs ordered Cath lab called 11:00 Orders written 2. Dyspnea related to smoking mild to moderate MR EF 50-55% will Have CT scan for cancer screeing on 9th 3. Chol:  Resume statin if she has CAD at cath  4. ADHD: continue adderall    Current medicines are reviewed at length with the patient today.  The patient does not have concerns regarding medicines.  The following changes have been made:    Disposition:   FU with me  Post cath     Signed, Jenkins Rouge, MD  10/09/2015 10:01 AM    [facility] [place], [place], [place]  [address] Phone: [phone]; Fax: [phone]

## 2015-10-09 ENCOUNTER — Ambulatory Visit (INDEPENDENT_AMBULATORY_CARE_PROVIDER_SITE_OTHER): Payer: BLUE CROSS/BLUE SHIELD | Admitting: Cardiovascular Disease

## 2015-10-09 ENCOUNTER — Encounter: Payer: Self-pay | Admitting: Cardiovascular Disease

## 2015-10-09 VITALS — BP 140/68 | HR 74 | Ht 64.0 in | Wt 131.1 lb

## 2015-10-09 DIAGNOSIS — Z01812 Encounter for preprocedural laboratory examination: Secondary | ICD-10-CM

## 2015-10-09 LAB — PROTIME-INR
INR: 0.9
Prothrombin Time: 10.1 s (ref 9.0–11.5)

## 2015-10-09 LAB — CBC WITH DIFFERENTIAL/PLATELET
Basophils Absolute: 99 cells/uL (ref 0–200)
Basophils Relative: 1 %
Eosinophils Absolute: 495 cells/uL (ref 15–500)
Eosinophils Relative: 5 %
HCT: 37.4 % (ref 35.0–45.0)
Hemoglobin: 12.2 g/dL (ref 11.7–15.5)
Lymphocytes Relative: 26 %
Lymphs Abs: 2574 cells/uL (ref 850–3900)
MCH: 31.4 pg (ref 27.0–33.0)
MCHC: 32.6 g/dL (ref 32.0–36.0)
MCV: 96.1 fL (ref 80.0–100.0)
MPV: 10.8 fL (ref 7.5–12.5)
Monocytes Absolute: 1188 cells/uL — ABNORMAL HIGH (ref 200–950)
Monocytes Relative: 12 %
Neutro Abs: 5544 cells/uL (ref 1500–7800)
Neutrophils Relative %: 56 %
Platelets: 248 10*3/uL (ref 140–400)
RBC: 3.89 MIL/uL (ref 3.80–5.10)
RDW: 15.3 % — ABNORMAL HIGH (ref 11.0–15.0)
WBC: 9.9 10*3/uL (ref 3.8–10.8)

## 2015-10-09 LAB — BRAIN NATRIURETIC PEPTIDE: Brain Natriuretic Peptide: 31.4 pg/mL (ref <100)

## 2015-10-09 MED ORDER — ASPIRIN EC 81 MG PO TBEC: 81.0000 mg | DELAYED_RELEASE_TABLET | Freq: Every day | ORAL | 3 refills | Status: DC

## 2015-10-09 NOTE — Patient Instructions (Addendum)
Medication Instructions:  Your physician has recommended you make the following change in your medication:  1-START aspirin 81 mg by mouth daily   Labwork: Your physician recommends that you have lab work today- BMET, CBC, and PT/INR  Testing/Procedures: Your physician has requested that you have a cardiac catheterization. Cardiac catheterization is used to diagnose and/or treat various heart conditions. Doctors may recommend this procedure for a number of different reasons. The most common reason is to evaluate chest pain. Chest pain can be a symptom of coronary artery disease (CAD), and cardiac catheterization can show whether plaque is narrowing or blocking your heart's arteries. This procedure is also used to evaluate the valves, as well as measure the blood flow and oxygen levels in different parts of your heart. For further information please visit HugeFiesta.[email or web address]. Please follow instruction sheet, as given.  Follow-Up: Your physician wants you to follow-up as directed after procedure.   If you need a refill on your cardiac medications before your next appointment, please call your pharmacy.

## 2015-10-11 ENCOUNTER — Other Ambulatory Visit: Payer: Self-pay | Admitting: Cardiovascular Disease

## 2015-10-11 DIAGNOSIS — I2583 Coronary atherosclerosis due to lipid rich plaque: Principal | ICD-10-CM

## 2015-10-11 DIAGNOSIS — I2510 Atherosclerotic heart disease of native coronary artery without angina pectoris: Secondary | ICD-10-CM

## 2015-10-18 ENCOUNTER — Ambulatory Visit (HOSPITAL_COMMUNITY)
Admission: RE | Admit: 2015-10-18 | Discharge: 2015-10-18 | Disposition: A | Discharge status: Home / Self Care | Payer: BLUE CROSS/BLUE SHIELD | Source: Ambulatory Visit | Attending: Cardiovascular Disease | Admitting: Cardiovascular Disease

## 2015-10-18 ENCOUNTER — Encounter (HOSPITAL_COMMUNITY): Payer: Self-pay | Admitting: *Deleted

## 2015-10-18 ENCOUNTER — Encounter (HOSPITAL_COMMUNITY)
Admission: RE | Disposition: A | Discharge status: Home / Self Care | Payer: Self-pay | Source: Ambulatory Visit | Attending: Cardiovascular Disease

## 2015-10-18 DIAGNOSIS — M810 Age-related osteoporosis without current pathological fracture: Secondary | ICD-10-CM | POA: Insufficient documentation

## 2015-10-18 DIAGNOSIS — E213 Hyperparathyroidism, unspecified: Secondary | ICD-10-CM | POA: Insufficient documentation

## 2015-10-18 DIAGNOSIS — I2583 Coronary atherosclerosis due to lipid rich plaque: Secondary | ICD-10-CM

## 2015-10-18 DIAGNOSIS — E785 Hyperlipidemia, unspecified: Secondary | ICD-10-CM | POA: Diagnosis not present

## 2015-10-18 DIAGNOSIS — F17210 Nicotine dependence, cigarettes, uncomplicated: Secondary | ICD-10-CM | POA: Insufficient documentation

## 2015-10-18 DIAGNOSIS — R079 Chest pain, unspecified: Secondary | ICD-10-CM | POA: Diagnosis not present

## 2015-10-18 DIAGNOSIS — M1990 Unspecified osteoarthritis, unspecified site: Secondary | ICD-10-CM | POA: Diagnosis not present

## 2015-10-18 DIAGNOSIS — R0789 Other chest pain: Secondary | ICD-10-CM | POA: Diagnosis present

## 2015-10-18 DIAGNOSIS — Z8249 Family history of ischemic heart disease and other diseases of the circulatory system: Secondary | ICD-10-CM | POA: Diagnosis not present

## 2015-10-18 DIAGNOSIS — I319 Disease of pericardium, unspecified: Secondary | ICD-10-CM | POA: Insufficient documentation

## 2015-10-18 DIAGNOSIS — Z8052 Family history of malignant neoplasm of bladder: Secondary | ICD-10-CM | POA: Insufficient documentation

## 2015-10-18 DIAGNOSIS — I2510 Atherosclerotic heart disease of native coronary artery without angina pectoris: Secondary | ICD-10-CM | POA: Insufficient documentation

## 2015-10-18 DIAGNOSIS — F909 Attention-deficit hyperactivity disorder, unspecified type: Secondary | ICD-10-CM | POA: Insufficient documentation

## 2015-10-18 DIAGNOSIS — Z853 Personal history of malignant neoplasm of breast: Secondary | ICD-10-CM | POA: Diagnosis not present

## 2015-10-18 DIAGNOSIS — F419 Anxiety disorder, unspecified: Secondary | ICD-10-CM | POA: Diagnosis not present

## 2015-10-18 DIAGNOSIS — Z833 Family history of diabetes mellitus: Secondary | ICD-10-CM | POA: Diagnosis not present

## 2015-10-18 DIAGNOSIS — I2584 Coronary atherosclerosis due to calcified coronary lesion: Secondary | ICD-10-CM | POA: Diagnosis not present

## 2015-10-18 DIAGNOSIS — G4700 Insomnia, unspecified: Secondary | ICD-10-CM | POA: Diagnosis not present

## 2015-10-18 HISTORY — PX: CARDIAC CATHETERIZATION: SHX172

## 2015-10-18 LAB — BASIC METABOLIC PANEL
Anion gap: 8 (ref 5–15)
BUN: 21 mg/dL — ABNORMAL HIGH (ref 6–20)
CO2: 24 mmol/L (ref 22–32)
Calcium: 9.5 mg/dL (ref 8.9–10.3)
Chloride: 110 mmol/L (ref 101–111)
Creatinine, Ser: 0.88 mg/dL (ref 0.44–1.00)
GFR calc Af Amer: >60 mL/min (ref >60)
GFR calc non Af Amer: >60 mL/min (ref >60)
Glucose, Bld: 93 mg/dL (ref 65–99)
Potassium: 4.2 mmol/L (ref 3.5–5.1)
Sodium: 142 mmol/L (ref 135–145)

## 2015-10-18 SURGERY — LEFT HEART CATH AND CORONARY ANGIOGRAPHY
Anesthesia: LOCAL

## 2015-10-18 MED ORDER — FENTANYL CITRATE (PF) 100 MCG/2ML IJ SOLN
INTRAMUSCULAR | Status: AC
  Filled 2015-10-18: qty 2

## 2015-10-18 MED ORDER — VERAPAMIL HCL 2.5 MG/ML IV SOLN
INTRAVENOUS | Status: DC | PRN
  Administered 2015-10-18: 10 mL via INTRA_ARTERIAL

## 2015-10-18 MED ORDER — ASPIRIN 81 MG PO CHEW: 81.0000 mg | CHEWABLE_TABLET | ORAL | Status: DC

## 2015-10-18 MED ORDER — SODIUM CHLORIDE 0.9 % WEIGHT BASED INFUSION: 3.0000 mL/kg/h | INTRAVENOUS | Status: DC

## 2015-10-18 MED ORDER — HEPARIN SODIUM (PORCINE) 1000 UNIT/ML IJ SOLN
INTRAMUSCULAR | Status: DC | PRN
  Administered 2015-10-18: 3500 [IU] via INTRAVENOUS

## 2015-10-18 MED ORDER — SODIUM CHLORIDE 0.9% FLUSH: 3.0000 mL | INTRAVENOUS | Status: DC | PRN

## 2015-10-18 MED ORDER — IOPAMIDOL (ISOVUE-370) INJECTION 76%
INTRAVENOUS | Status: DC | PRN
  Administered 2015-10-18: 60 mL

## 2015-10-18 MED ORDER — OXYCODONE-ACETAMINOPHEN 5-325 MG PO TABS: 1.0000 | ORAL_TABLET | ORAL | Status: DC | PRN

## 2015-10-18 MED ORDER — HEPARIN SODIUM (PORCINE) 1000 UNIT/ML IJ SOLN
INTRAMUSCULAR | Status: AC
  Filled 2015-10-18: qty 1

## 2015-10-18 MED ORDER — SODIUM CHLORIDE 0.9 % IV SOLN: 250.0000 mL | INTRAVENOUS | Status: DC | PRN

## 2015-10-18 MED ORDER — VERAPAMIL HCL 2.5 MG/ML IV SOLN
INTRAVENOUS | Status: AC
  Filled 2015-10-18: qty 2

## 2015-10-18 MED ORDER — SODIUM CHLORIDE 0.9 % WEIGHT BASED INFUSION
3.0000 mL/kg/h | INTRAVENOUS | Status: DC
  Administered 2015-10-18: 3 mL/kg/h via INTRAVENOUS

## 2015-10-18 MED ORDER — MIDAZOLAM HCL 2 MG/2ML IJ SOLN
INTRAMUSCULAR | Status: DC | PRN
  Administered 2015-10-18: 2 mg via INTRAVENOUS

## 2015-10-18 MED ORDER — FENTANYL CITRATE (PF) 100 MCG/2ML IJ SOLN
INTRAMUSCULAR | Status: DC | PRN
  Administered 2015-10-18: 50 ug via INTRAVENOUS

## 2015-10-18 MED ORDER — SODIUM CHLORIDE 0.9% FLUSH: 3.0000 mL | Freq: Two times a day (BID) | INTRAVENOUS | Status: DC

## 2015-10-18 MED ORDER — IOPAMIDOL (ISOVUE-370) INJECTION 76%
INTRAVENOUS | Status: AC
  Filled 2015-10-18: qty 100

## 2015-10-18 MED ORDER — HEPARIN (PORCINE) IN NACL 2-0.9 UNIT/ML-% IJ SOLN
INTRAMUSCULAR | Status: DC | PRN
  Administered 2015-10-18: 11:00:00

## 2015-10-18 MED ORDER — MIDAZOLAM HCL 2 MG/2ML IJ SOLN
INTRAMUSCULAR | Status: AC
  Filled 2015-10-18: qty 2

## 2015-10-18 MED ORDER — DIAZEPAM 2 MG PO TABS: 2.0000 mg | ORAL_TABLET | Freq: Four times a day (QID) | ORAL | Status: DC | PRN

## 2015-10-18 MED ORDER — SODIUM CHLORIDE 0.9 % WEIGHT BASED INFUSION: 1.0000 mL/kg/h | INTRAVENOUS | Status: DC

## 2015-10-18 MED ORDER — LIDOCAINE HCL (PF) 1 % IJ SOLN
INTRAMUSCULAR | Status: DC | PRN
  Administered 2015-10-18: 2 mL

## 2015-10-18 MED ORDER — LIDOCAINE HCL (PF) 1 % IJ SOLN
INTRAMUSCULAR | Status: AC
  Filled 2015-10-18: qty 30

## 2015-10-18 MED ORDER — HEPARIN (PORCINE) IN NACL 2-0.9 UNIT/ML-% IJ SOLN
INTRAMUSCULAR | Status: AC
  Filled 2015-10-18: qty 1000

## 2015-10-18 SURGICAL SUPPLY — 12 items
CATH INFINITI 5 FR JL3.5 (CATHETERS) ×1 IMPLANT
CATH INFINITI 5FR ANG PIGTAIL (CATHETERS) ×1 IMPLANT
CATH INFINITI JR4 5F (CATHETERS) ×1 IMPLANT
DEVICE RAD COMP TR BAND LRG (VASCULAR PRODUCTS) ×1 IMPLANT
GLIDESHEATH SLEND A-KIT 6F 22G (SHEATH) IMPLANT
GLIDESHEATH SLEND SS 6F .021 (SHEATH) ×1 IMPLANT
KIT HEART LEFT (KITS) ×2 IMPLANT
PACK CARDIAC CATHETERIZATION (CUSTOM PROCEDURE TRAY) ×2 IMPLANT
SYR MEDRAD MARK V 150ML (SYRINGE) ×2 IMPLANT
TRANSDUCER W/STOPCOCK (MISCELLANEOUS) ×2 IMPLANT
TUBING CIL FLEX 10 FLL-RA (TUBING) ×2 IMPLANT
WIRE SAFE-T 1.5MM-J .035X260CM (WIRE) ×1 IMPLANT

## 2015-10-18 NOTE — Interval H&P Note (Signed)
History and Physical Interval Note:  10/18/2015 10:13 AM  Maureen Chung  has presented today for surgery, with the diagnosis of chest pain  The various methods of treatment have been discussed with the patient and family. After consideration of risks, benefits and other options for treatment, the patient has consented to  Procedure(s): Left Heart Cath and Coronary Angiography (N/A) as a surgical intervention .  The patient's history has been reviewed, patient examined, no change in status, stable for surgery.  I have reviewed the patient's chart and labs.  Questions were answered to the patient's satisfaction.     Jenkins Rouge

## 2015-10-18 NOTE — Research (Signed)
[place] Study Informed Consent   Subject Name: Maureen Chung  Subject met inclusion and exclusion criteria.  The informed consent form, study requirements and expectations were reviewed with the subject and questions and concerns were addressed prior to the signing of the consent form.  The subject verbalized understanding of the trial requirements.  The subject agreed to participate in the trial and signed the informed consent.  The informed consent was obtained prior to performance of any protocol-specific procedures for the subject.  A copy of the signed informed consent was given to the subject and a copy was placed in the subject's medical record.  [name] 10/18/2015, 11:39 AM

## 2015-10-18 NOTE — CV Procedure (Signed)
See cath note in Epic

## 2015-10-18 NOTE — H&P (View-Only) (Signed)
Patient ID: Maureen Chung, female   DOB: [birth year], 65 y.o.   MRN: [id number]     Cardiology Office Note   Date:  10/09/2015   ID:  Maureen Chung, DOB Feb 16, 1951, MRN [id number]  PCP:  London Pepper, MD  Cardiologist:   Jenkins Rouge, MD   Chief Complaint  Patient presents with  . Follow-up      History of Present Illness: Maureen Chung is a 65 y.o. female who presents for dyspnea Current 1 ppd smoker. Family history of premature CAD.  She has been having constitutional symptoms  For a few months Malaise, fatiugue dyspnea some LE edema. Diarrhea  No PND , orthopnea and no syncope or palpitations. She has had some atypical chest pains Sharp fleeting radiate to left arm. Has not tried to quit smoking in long time CXR last year ok Was recommended to enroll in lung cancer screening program   Lab reviewe [facility]: Hct 37.3 Cr .91 TSH 1.97 LDL 113  Echo 08/31/15 Impressions:  - LVEF 50-55%, normal wall thickness, normal wall motion, diastolic   dysfunction, indeterminate LV filling pressure, mildly thickened   leaflets with elongated leaflet tips, mild to moderate mitral   regurgitation, normal LA size, aneurysmal IAS - cannot exclude   small PFO, normal IVC.  Myovue:  08/31/15 low risk    Study Highlights    Nuclear stress EF: 50%.  Upsloping ST segment depression ST segment depression of 1 mm was noted during stress in the V5, V6, III and aVF leads.  This is a low risk study.  The left ventricular ejection fraction is mildly decreased (45-54%).  Findings consistent with ischemia.   Small area of mild apical ischemia. ECG positive with 1 mm upsloping ST segment depression Surface images with EF 50% mild apical hypokinesis     Statin stopped last visit for constitutional symptoms  Feels like she may have more energy.  Continue to have SSCP has enrolled in lung cancer screeing Program.  Discussed options Given ongoing SSCP and abnormal myovue with risk  factors Favor cath Risks including stroke , MI bleeding and contrast reaction discussed Willing to proceed     Past Medical History:  Diagnosis Date  . Anxiety   . Arthritis   . Breast cancer ([place]) 2002   s/p lumpectomy and radiation Dr. Margot Chimes  . Hypercalcemia   . Hyperlipidemia   . Hyperparathyroidism   . Insomnia   . Osteoporosis   . Pericarditis     Past Surgical History:  Procedure Laterality Date  . BREAST CYST EXCISION  1997   benign  . child birth  64  . LAPAROSCOPIC ENDOMETRIOSIS FULGURATION  1989, 1995  . PARATHYROIDECTOMY  11/01/10  . TONSILECTOMY, ADENOIDECTOMY, BILATERAL MYRINGOTOMY AND TUBES  1969  . VAGINAL HYSTERECTOMY  1981   without ovaries     Current Outpatient Prescriptions  Medication Sig Dispense Refill  . amphetamine-dextroamphetamine (ADDERALL) 20 MG tablet Take 20 mg by mouth daily.    . Cholecalciferol (VITAMIN D3) 2000 units TABS Take 4,000 mg by mouth daily.    . Naftifine HCl 2 % CREA Apply 1 application topically daily.    . nitroGLYCERIN (NITROSTAT) 0.4 MG SL tablet Place 1 tablet (0.4 mg total) under the tongue every 5 (five) minutes as needed for chest pain. 25 tablet 3  . sertraline (ZOLOFT) 100 MG tablet Take 200 mg by mouth daily.     No current facility-administered medications for this visit.     Allergies:  Codeine sulfate and Latex    Social History:  The patient  reports that she has been smoking Cigarettes.  She has a 35.00 pack-year smoking history. She has never used smokeless tobacco. She reports that she drinks alcohol. She reports that she does not use drugs.   Family History:  The patient's family history includes Bladder Cancer in her brother; Cancer in her mother; Coronary artery disease in her brother; Diabetes in her father; Heart disease in her brother and father.    ROS:  Please see the history of present illness.   Otherwise, review of systems are positive for none.   All other systems are reviewed and  negative.    PHYSICAL EXAM: VS:  BP 140/68   Pulse 74   Ht 5\' 4"  (1.626 m)   Wt 131 lb 1.9 oz (59.5 kg)   BMI 22.51 kg/m  , BMI Body mass index is 22.51 kg/m. Affect appropriate Healthy:  appears stated age 78: normal Neck supple with no adenopathy JVP normal no bruits no thyromegaly Lungs clear with no wheezing and good diaphragmatic motion Heart:  S1/S2 no murmur, no rub, gallop or click PMI normal Abdomen: benighn, BS positve, no tenderness, no AAA no bruit.  No HSM or HJR Distal pulses intact with no bruits No edema Neuro non-focal Skin warm and dry No muscular weakness    EKG:   10/28/10 SR rate 75 LAE nonspecific ST/T wave changes  08/09/15 SR rate 81 biatrial enlargement normal    Recent Labs: No results found for requested labs within last 8760 hours.    Lipid Panel No results found for: CHOL, TRIG, HDL, CHOLHDL, VLDL, LDLCALC, LDLDIRECT    Wt Readings from Last 3 Encounters:  10/09/15 131 lb 1.9 oz (59.5 kg)  08/09/15 132 lb 3.2 oz (60 kg)  01/08/11 125 lb 8 oz (56.9 kg)      Other studies Reviewed: Additional studies/ records that were reviewed today include: Primary notes labs and CXR.    ASSESSMENT AND PLAN:  1.  Chest Pain:  Atypical abnormal myovue ongoing in smoker cath  Scheduled for next Thursday labs ordered Cath lab called 11:00 Orders written 2. Dyspnea related to smoking mild to moderate MR EF 50-55% will Have CT scan for cancer screeing on 9th 3. Chol:  Resume statin if she has CAD at cath  4. ADHD: continue adderall    Current medicines are reviewed at length with the patient today.  The patient does not have concerns regarding medicines.  The following changes have been made:    Disposition:   FU with me  Post cath     Signed, Jenkins Rouge, MD  10/09/2015 10:01 AM    [facility] [address], [place], [place]  [address] Phone: [phone]; Fax: [phone]

## 2015-10-18 NOTE — Discharge Instructions (Signed)
Radial Site Care °Refer to this sheet in the next few weeks. These instructions provide you with information about caring for yourself after your procedure. Your health care provider may also give you more specific instructions. Your treatment has been planned according to current medical practices, but problems sometimes occur. Call your health care provider if you have any problems or questions after your procedure. °WHAT TO EXPECT AFTER THE PROCEDURE °After your procedure, it is typical to have the following: °· Bruising at the radial site that usually fades within 1-2 weeks. °· Blood collecting in the tissue (hematoma) that may be painful to the touch. It should usually decrease in size and tenderness within 1-2 weeks. °HOME CARE INSTRUCTIONS °· Take medicines only as directed by your health care provider. °· You may shower 24-48 hours after the procedure or as directed by your health care provider. Remove the bandage (dressing) and gently wash the site with plain soap and water. Pat the area dry with a clean towel. Do not rub the site, because this may cause bleeding. °· Do not take baths, swim, or use a hot tub until your health care provider approves. °· Check your insertion site every day for redness, swelling, or drainage. °· Do not apply powder or lotion to the site. °· Do not flex or bend the affected arm for 24 hours or as directed by your health care provider. °· Do not push or pull heavy objects with the affected arm for 24 hours or as directed by your health care provider. °· Do not lift over 10 lb (4.5 kg) for 5 days after your procedure or as directed by your health care provider. °· Ask your health care provider when it is okay to: °¨ Return to work or school. °¨ Resume usual physical activities or sports. °¨ Resume sexual activity. °· Do not drive home if you are discharged the same day as the procedure. Have someone else drive you. °· You may drive 24 hours after the procedure unless otherwise  instructed by your health care provider. °· Do not operate machinery or power tools for 24 hours after the procedure. °· If your procedure was done as an outpatient procedure, which means that you went home the same day as your procedure, a responsible adult should be with you for the first 24 hours after you arrive home. °· Keep all follow-up visits as directed by your health care provider. This is important. °SEEK MEDICAL CARE IF: °· You have a fever. °· You have chills. °· You have increased bleeding from the radial site. Hold pressure on the site. CALL 911 °SEEK IMMEDIATE MEDICAL CARE IF: °· You have unusual pain at the radial site. °· You have redness, warmth, or swelling at the radial site. °· You have drainage (other than a small amount of blood on the dressing) from the radial site. °· The radial site is bleeding, and the bleeding does not stop after 30 minutes of holding steady pressure on the site. °· Your arm or hand becomes pale, cool, tingly, or numb. °  °This information is not intended to replace advice given to you by your health care provider. Make sure you discuss any questions you have with your health care provider. °  °Document Released: 04/05/2010 Document Revised: 03/24/2014 Document Reviewed: 09/19/2013 °Elsevier Interactive Patient Education ©2016 [employer] ° °

## 2015-10-22 ENCOUNTER — Ambulatory Visit: Payer: BLUE CROSS/BLUE SHIELD | Admitting: Cardiovascular Disease

## 2015-10-24 ENCOUNTER — Ambulatory Visit (INDEPENDENT_AMBULATORY_CARE_PROVIDER_SITE_OTHER): Payer: BLUE CROSS/BLUE SHIELD | Admitting: Acute Care

## 2015-10-24 ENCOUNTER — Encounter: Payer: Self-pay | Admitting: Acute Care

## 2015-10-24 ENCOUNTER — Ambulatory Visit: Admission: RE | Admit: 2015-10-24 | Payer: BLUE CROSS/BLUE SHIELD | Source: Ambulatory Visit

## 2015-10-24 DIAGNOSIS — F17210 Nicotine dependence, cigarettes, uncomplicated: Secondary | ICD-10-CM | POA: Diagnosis not present

## 2015-10-24 NOTE — Progress Notes (Signed)
Shared Decision Making Visit Lung Cancer Screening Program [phone])   Eligibility:  Age 65 y.o.  Pack Years Smoking History Calculation 31 pack years (# packs/per year x # years smoked)  Recent History of coughing up blood  no  Unexplained weight loss? no ( >Than 15 pounds within the last 6 months )  Prior History Lung / other cancer no (Diagnosis within the last 5 years already requiring surveillance chest CT Scans).  Smoking Status Current Smoker  Former Smokers: Years since quit: NA  Quit Date: NA  Visit Components:  Discussion included one or more decision making aids. yes  Discussion included risk/benefits of screening. yes  Discussion included potential follow up diagnostic testing for abnormal scans. yes  Discussion included meaning and risk of over diagnosis. yes  Discussion included meaning and risk of False Positives. yes  Discussion included meaning of total radiation exposure. yes  Counseling Included:  Importance of adherence to annual lung cancer LDCT screening. yes  Impact of comorbidities on ability to participate in the program. yes  Ability and willingness to under diagnostic treatment. yes  Smoking Cessation Counseling:  Current Smokers:   Discussed importance of smoking cessation. yes  Information about tobacco cessation classes and interventions provided to patient. yes  Patient provided with "ticket" for LDCT Scan. yes  Symptomatic Patient. no  Counseling: NA  Diagnosis Code: Tobacco Use Z72.0  Asymptomatic Patient yes  Counseling (Intermediate counseling: > three minutes counseling) [id number]  Former Smokers:   Discussed the importance of maintaining cigarette abstinence. yes  Diagnosis Code: Personal History of Nicotine Dependence. [id number]  Information about tobacco cessation classes and interventions provided to patient. Yes  Patient provided with "ticket" for LDCT Scan. yes  Written Order for Lung Cancer Screening with  LDCT placed in Epic. Yes (CT Chest Lung Cancer Screening Low Dose W/O CM) [id number] Z12.2-Screening of respiratory organs Z87.891-Personal history of nicotine dependence  I have spent 20 minutes of face to face time with Maureen Chung discussing the risks and benefits of lung cancer screening. We viewed a power point together that explained in detail the above noted topics. We paused at intervals to allow for questions to be asked and answered to ensure understanding.We discussed that the single most powerful action that she can take to decrease her risk of developing lung cancer is to quit smoking. We discussed whether or not she is ready to commit to setting a quit date. We discussed options for tools to aid in quitting smoking including nicotine replacement therapy, non-nicotine medications, support groups, Quit Smart classes, and behavior modification. We discussed that often times setting smaller, more achievable goals, such as eliminating 1 cigarette a day for a week and then 2 cigarettes a day for a week can be helpful in slowly decreasing the number of cigarettes smoked. This allows for a sense of accomplishment as well as providing a clinical benefit. I gave her the " Be Stronger Than Your Excuses" card with contact information for community resources, classes, free nicotine replacement therapy, and access to mobile apps, text messaging, and on-line smoking cessation help. I have also given her my card and contact information in the event she needs to contact me. We discussed the time and location of the scan, and that either June Leap, CMA, or I will call with the results within 24-48 hours of receiving them. I have provided her with a copy of the power point we viewed  as a resource in the event they need reinforcement of the  concepts we discussed today in the office. The patient verbalized understanding of all of  the above and had no further questions upon leaving the office. They have my contact  information in the event they have any further questions.    Maureen Spatz, Maureen Chung 10/24/2015

## 2016-01-07 ENCOUNTER — Ambulatory Visit (INDEPENDENT_AMBULATORY_CARE_PROVIDER_SITE_OTHER)
Admission: RE | Admit: 2016-01-07 | Discharge: 2016-01-07 | Disposition: A | Discharge status: Home / Self Care | Payer: BLUE CROSS/BLUE SHIELD | Source: Ambulatory Visit | Attending: Acute Care | Admitting: Acute Care

## 2016-01-07 DIAGNOSIS — Z87891 Personal history of nicotine dependence: Secondary | ICD-10-CM

## 2016-01-07 DIAGNOSIS — F17210 Nicotine dependence, cigarettes, uncomplicated: Secondary | ICD-10-CM

## 2016-01-08 ENCOUNTER — Telehealth: Payer: Self-pay | Admitting: Acute Care

## 2016-01-08 DIAGNOSIS — F17210 Nicotine dependence, cigarettes, uncomplicated: Secondary | ICD-10-CM

## 2016-01-08 NOTE — Telephone Encounter (Signed)
I have called Maureen Chung with the results of her low-dose screening CT. I explained to her that her scan was read as a Lung RADS 2: nodules that are benign in appearance and behavior with a very low likelihood of becoming a clinically active cancer due to size or lack of growth. Recommendation per radiology is for a repeat LDCT in 12 months. I told her that we will order and schedule her follow-up scan for October 2018. I also notified her that her scan had an incidental finding of age advanced coronary artery atherosclerosis. She was recently on a statin however has had a full cardiac workup including stress test, and cardiac cath. Per cath proximal LAD to mid LAD noted a 20% stenosis. Otherwise cath was without indication of disease. EF 65% with some mild calcification noted. As this patient is already being followed by cardiology, I will forward the results of the scan to her primary care provider for completeness of her medical record. [name] verbalized understanding of the above.  We had a long conversation regarding the fact that she has had excellent cardiology follow-up. We will scan her again in 12 months per radiology recommendations. [name] had no questions upon completion of the call however has my contact information in the event she has questions in the future.

## 2016-12-30 DIAGNOSIS — R899 Unspecified abnormal finding in specimens from other organs, systems and tissues: Secondary | ICD-10-CM | POA: Diagnosis not present

## 2016-12-30 DIAGNOSIS — G4700 Insomnia, unspecified: Secondary | ICD-10-CM | POA: Diagnosis not present

## 2016-12-30 DIAGNOSIS — Z720 Tobacco use: Secondary | ICD-10-CM | POA: Diagnosis not present

## 2016-12-30 DIAGNOSIS — F909 Attention-deficit hyperactivity disorder, unspecified type: Secondary | ICD-10-CM | POA: Diagnosis not present

## 2016-12-30 DIAGNOSIS — F339 Major depressive disorder, recurrent, unspecified: Secondary | ICD-10-CM | POA: Diagnosis not present

## 2016-12-30 DIAGNOSIS — F411 Generalized anxiety disorder: Secondary | ICD-10-CM | POA: Diagnosis not present

## 2017-01-02 DIAGNOSIS — F419 Anxiety disorder, unspecified: Secondary | ICD-10-CM | POA: Diagnosis not present

## 2017-01-02 DIAGNOSIS — M25562 Pain in left knee: Secondary | ICD-10-CM | POA: Diagnosis not present

## 2017-01-02 DIAGNOSIS — S8002XA Contusion of left knee, initial encounter: Secondary | ICD-10-CM | POA: Diagnosis not present

## 2017-01-02 DIAGNOSIS — S0180XA Unspecified open wound of other part of head, initial encounter: Secondary | ICD-10-CM | POA: Diagnosis not present

## 2017-01-13 ENCOUNTER — Ambulatory Visit (INDEPENDENT_AMBULATORY_CARE_PROVIDER_SITE_OTHER)
Admission: RE | Admit: 2017-01-13 | Discharge: 2017-01-13 | Disposition: A | Discharge status: Home / Self Care | Payer: Medicare Other | Source: Ambulatory Visit | Attending: Acute Care | Admitting: Acute Care

## 2017-01-13 DIAGNOSIS — F17210 Nicotine dependence, cigarettes, uncomplicated: Secondary | ICD-10-CM

## 2017-01-13 DIAGNOSIS — Z87891 Personal history of nicotine dependence: Secondary | ICD-10-CM

## 2017-01-13 NOTE — Progress Notes (Signed)
Patient ID: Maureen Chung, female   DOB: [birth year], 66 y.o.   MRN: [id number]     Cardiology Office Note   Date:  01/14/2017   ID:  Maureen Chung, DOB April 21, 1950, MRN [id number]  PCP:  London Pepper, MD  Cardiologist:   Jenkins Rouge, MD   No chief complaint on file.     History of Present Illness: Maureen Chung is a 66 y.o. female who presents for dyspnea Current 1 ppd smoker. Family history of premature CAD.  She has been having constitutional symptoms  For a few months Malaise, fatiugue dyspnea some LE edema. Diarrhea  No PND , orthopnea and no syncope or palpitations. She has had some atypical chest pains Sharp fleeting radiate to left arm. Has not tried to quit smoking in long time CXR last year ok Was recommended to enroll in lung cancer screening program   Lab [facility]: Hct 37.3 Cr .91 TSH 1.97 LDL 113  Echo 08/31/15 Impressions:  - LVEF 50-55%, normal wall thickness, normal wall motion, diastolic   dysfunction, indeterminate LV filling pressure, mildly thickened   leaflets with elongated leaflet tips, mild to moderate mitral   regurgitation, normal LA size, aneurysmal IAS - cannot exclude   small PFO, normal IVC.  Myovue:  08/31/15 low risk EF 50% small area of mild apical ischemia   Cath:  10/18/15 No significant CAD EF 65% EDP 9 mmHg   Statin stopped last visit for constitutional symptoms  Feels like she may have more energy. No chest pain chronic dyspnea from smoking   Past Medical History:  Diagnosis Date  . Anxiety   . Arthritis   . Hypercalcemia   . Hyperlipidemia   . Hyperparathyroidism   . Insomnia   . Osteoporosis   . Pericarditis     Past Surgical History:  Procedure Laterality Date  . BREAST CYST EXCISION  1997   benign  . CARDIAC CATHETERIZATION N/A 10/18/2015   Procedure: Left Heart Cath and Coronary Angiography;  Surgeon: Josue Hector, MD;  Location: [facility];  Service: Cardiovascular;  Laterality: N/A;  . child  birth  25  . LAPAROSCOPIC ENDOMETRIOSIS FULGURATION  1989, 1995  . PARATHYROIDECTOMY  11/01/10  . TONSILECTOMY, ADENOIDECTOMY, BILATERAL MYRINGOTOMY AND TUBES  1969  . VAGINAL HYSTERECTOMY  1981   without ovaries     Current Outpatient Prescriptions  Medication Sig Dispense Refill  . amphetamine-dextroamphetamine (ADDERALL) 20 MG tablet Take 20 mg by mouth 2 (two) times daily.     Marland Kitchen aspirin EC 81 MG tablet Take 1 tablet (81 mg total) by mouth daily. 90 tablet 3  . Cholecalciferol (VITAMIN D3) 5000 units TABS Take 10,000 Units by mouth daily.     . clonazePAM (KLONOPIN) 0.5 MG tablet Take 0.5 mg by mouth as needed.    . doxepin (SINEQUAN) 10 MG capsule Take 10 mg by mouth at bedtime.    . Loratadine (CLARITIN PO) Take 1 tablet by mouth daily.    . Magnesium Oxide (MAG-OXIDE PO) Take 1 tablet by mouth daily.    . nitroGLYCERIN (NITROSTAT) 0.4 MG SL tablet Place 1 tablet (0.4 mg total) under the tongue every 5 (five) minutes as needed for chest pain. 25 tablet 3  . OVER THE COUNTER MEDICATION Take 30-45 mLs by mouth daily. Omega Liposomal Vitamin C    . sertraline (ZOLOFT) 100 MG tablet Take 200 mg by mouth daily.    . Vitamin Mixture (VITAMIN C) LIQD Take by mouth. 2-3  tbsp qd     No current facility-administered medications for this visit.     Allergies:   Latex    Social History:  The patient  reports that she has been smoking Cigarettes.  She has a 35.00 pack-year smoking history. She has never used smokeless tobacco. She reports that she drinks alcohol. She reports that she does not use drugs.   Family History:  The patient's family history includes Bladder Cancer in her brother; Cancer in her mother; Coronary artery disease in her brother; Diabetes in her father; Heart disease in her brother and father.    ROS:  Please see the history of present illness.   Otherwise, review of systems are positive for none.   All other systems are reviewed and negative.    PHYSICAL EXAM: VS:   BP (!) 144/68   Pulse 85   Ht 5\' 4"  (1.626 m)   Wt 136 lb 1.9 oz (61.7 kg)   SpO2 96%   BMI 23.36 kg/m  , BMI Body mass index is 23.36 kg/m. Affect appropriate Bronchitic white female HEENT: normal Neck supple with no adenopathy JVP normal no bruits no thyromegaly Lungs clear with no wheezing and good diaphragmatic motion Heart:  S1/S2 no murmur, no rub, gallop or click PMI normal Abdomen: benighn, BS positve, no tenderness, no AAA no bruit.  No HSM or HJR Distal pulses intact with no bruits No edema Neuro non-focal Skin warm and dry No muscular weakness     EKG:   10/28/10 SR rate 75 LAE nonspecific ST/T wave changes  08/09/15 SR rate 81 biatrial enlargement normal  01/14/17  SR rate 85 poor R wave progression   Recent Labs: No results found for requested labs within last 8760 hours.    Lipid Panel No results found for: CHOL, TRIG, HDL, CHOLHDL, VLDL, LDLCALC, LDLDIRECT    Wt Readings from Last 3 Encounters:  01/14/17 136 lb 1.9 oz (61.7 kg)  10/18/15 128 lb (58.1 kg)  10/09/15 131 lb 1.9 oz (59.5 kg)      Other studies Reviewed: Additional studies/ records that were reviewed today include: Primary notes labs and CXR.and CT    ASSESSMENT AND PLAN:  1.  Chest Pain:   Non cardiac normal cath 10/18/15 2. Dyspnea related to smoking EF/EDP normal cath current 30 packyear smoker lung cancer screening CT done And no nodules or high risk features f/u in a year  3. Chol:  No CAD constitutional improved off statin  4. ADHD: continue adderall    Current medicines are reviewed at length with the patient today.  The patient does not have concerns regarding medicines.  The following changes have been made:    Disposition:   FU with me  In a year     Signed, Jenkins Rouge, MD  01/14/2017 3:48 PM    [facility] [place], [place], [place]  [address] Phone: [phone]; Fax: [phone]

## 2017-01-14 ENCOUNTER — Encounter: Payer: Self-pay | Admitting: Cardiovascular Disease

## 2017-01-14 ENCOUNTER — Ambulatory Visit (INDEPENDENT_AMBULATORY_CARE_PROVIDER_SITE_OTHER): Payer: Medicare Other | Admitting: Cardiovascular Disease

## 2017-01-14 VITALS — BP 144/68 | HR 85 | Ht 64.0 in | Wt 136.1 lb

## 2017-01-14 DIAGNOSIS — R079 Chest pain, unspecified: Secondary | ICD-10-CM

## 2017-01-14 NOTE — Patient Instructions (Addendum)
Medication Instructions:  Your physician recommends that you continue on your current medications as directed. Please refer to the Current Medication list given to you today.  Labwork: NONE  Testing/Procedures: NONE  Follow-Up: Your physician wants you to follow-up as needed with  Dr. Nishan.    If you need a refill on your cardiac medications before your next appointment, please call your pharmacy.    

## 2017-01-15 ENCOUNTER — Other Ambulatory Visit: Payer: Self-pay | Admitting: Acute Care

## 2017-01-15 DIAGNOSIS — Z122 Encounter for screening for malignant neoplasm of respiratory organs: Secondary | ICD-10-CM

## 2017-01-15 DIAGNOSIS — F17210 Nicotine dependence, cigarettes, uncomplicated: Secondary | ICD-10-CM

## 2017-01-26 DIAGNOSIS — M79641 Pain in right hand: Secondary | ICD-10-CM | POA: Diagnosis not present

## 2017-03-05 DIAGNOSIS — L309 Dermatitis, unspecified: Secondary | ICD-10-CM | POA: Diagnosis not present

## 2017-03-05 DIAGNOSIS — L814 Other melanin hyperpigmentation: Secondary | ICD-10-CM | POA: Diagnosis not present

## 2017-03-05 DIAGNOSIS — D225 Melanocytic nevi of trunk: Secondary | ICD-10-CM | POA: Diagnosis not present

## 2017-05-05 DIAGNOSIS — H2513 Age-related nuclear cataract, bilateral: Secondary | ICD-10-CM | POA: Diagnosis not present

## 2017-06-08 DIAGNOSIS — H40013 Open angle with borderline findings, low risk, bilateral: Secondary | ICD-10-CM | POA: Diagnosis not present

## 2017-06-17 DIAGNOSIS — F339 Major depressive disorder, recurrent, unspecified: Secondary | ICD-10-CM | POA: Diagnosis not present

## 2017-06-17 DIAGNOSIS — G4700 Insomnia, unspecified: Secondary | ICD-10-CM | POA: Diagnosis not present

## 2017-06-17 DIAGNOSIS — Z720 Tobacco use: Secondary | ICD-10-CM | POA: Diagnosis not present

## 2017-06-17 DIAGNOSIS — F411 Generalized anxiety disorder: Secondary | ICD-10-CM | POA: Diagnosis not present

## 2017-06-17 DIAGNOSIS — E785 Hyperlipidemia, unspecified: Secondary | ICD-10-CM | POA: Diagnosis not present

## 2017-06-17 DIAGNOSIS — F909 Attention-deficit hyperactivity disorder, unspecified type: Secondary | ICD-10-CM | POA: Diagnosis not present

## 2017-11-30 DIAGNOSIS — Z23 Encounter for immunization: Secondary | ICD-10-CM | POA: Diagnosis not present

## 2017-11-30 DIAGNOSIS — F339 Major depressive disorder, recurrent, unspecified: Secondary | ICD-10-CM | POA: Diagnosis not present

## 2017-11-30 DIAGNOSIS — Z720 Tobacco use: Secondary | ICD-10-CM | POA: Diagnosis not present

## 2017-11-30 DIAGNOSIS — F909 Attention-deficit hyperactivity disorder, unspecified type: Secondary | ICD-10-CM | POA: Diagnosis not present

## 2017-11-30 DIAGNOSIS — R6889 Other general symptoms and signs: Secondary | ICD-10-CM | POA: Diagnosis not present

## 2018-01-04 DIAGNOSIS — H3589 Other specified retinal disorders: Secondary | ICD-10-CM | POA: Diagnosis not present

## 2018-01-04 DIAGNOSIS — H40013 Open angle with borderline findings, low risk, bilateral: Secondary | ICD-10-CM | POA: Diagnosis not present

## 2018-01-04 DIAGNOSIS — H40053 Ocular hypertension, bilateral: Secondary | ICD-10-CM | POA: Diagnosis not present

## 2018-03-05 DIAGNOSIS — B078 Other viral warts: Secondary | ICD-10-CM | POA: Diagnosis not present

## 2018-03-05 DIAGNOSIS — L814 Other melanin hyperpigmentation: Secondary | ICD-10-CM | POA: Diagnosis not present

## 2018-03-05 DIAGNOSIS — D225 Melanocytic nevi of trunk: Secondary | ICD-10-CM | POA: Diagnosis not present

## 2018-03-05 DIAGNOSIS — L821 Other seborrheic keratosis: Secondary | ICD-10-CM | POA: Diagnosis not present

## 2018-03-05 DIAGNOSIS — D1801 Hemangioma of skin and subcutaneous tissue: Secondary | ICD-10-CM | POA: Diagnosis not present

## 2018-03-05 DIAGNOSIS — L300 Nummular dermatitis: Secondary | ICD-10-CM | POA: Diagnosis not present

## 2018-03-05 DIAGNOSIS — L710 Perioral dermatitis: Secondary | ICD-10-CM | POA: Diagnosis not present

## 2018-03-29 DIAGNOSIS — R030 Elevated blood-pressure reading, without diagnosis of hypertension: Secondary | ICD-10-CM | POA: Diagnosis not present

## 2018-03-29 DIAGNOSIS — Z0000 Encounter for general adult medical examination without abnormal findings: Secondary | ICD-10-CM | POA: Diagnosis not present

## 2018-03-29 DIAGNOSIS — F322 Major depressive disorder, single episode, severe without psychotic features: Secondary | ICD-10-CM | POA: Diagnosis not present

## 2018-03-29 DIAGNOSIS — E785 Hyperlipidemia, unspecified: Secondary | ICD-10-CM | POA: Diagnosis not present

## 2018-03-29 DIAGNOSIS — F909 Attention-deficit hyperactivity disorder, unspecified type: Secondary | ICD-10-CM | POA: Diagnosis not present

## 2018-03-29 DIAGNOSIS — Z23 Encounter for immunization: Secondary | ICD-10-CM | POA: Diagnosis not present

## 2018-03-29 DIAGNOSIS — R112 Nausea with vomiting, unspecified: Secondary | ICD-10-CM | POA: Diagnosis not present

## 2018-03-29 DIAGNOSIS — Z720 Tobacco use: Secondary | ICD-10-CM | POA: Diagnosis not present

## 2018-04-16 DIAGNOSIS — L710 Perioral dermatitis: Secondary | ICD-10-CM | POA: Diagnosis not present

## 2018-04-16 DIAGNOSIS — B078 Other viral warts: Secondary | ICD-10-CM | POA: Diagnosis not present

## 2018-04-23 DIAGNOSIS — D72829 Elevated white blood cell count, unspecified: Secondary | ICD-10-CM | POA: Diagnosis not present

## 2018-04-23 DIAGNOSIS — R5383 Other fatigue: Secondary | ICD-10-CM | POA: Diagnosis not present

## 2018-04-23 DIAGNOSIS — Z79899 Other long term (current) drug therapy: Secondary | ICD-10-CM | POA: Diagnosis not present

## 2018-04-23 DIAGNOSIS — I10 Essential (primary) hypertension: Secondary | ICD-10-CM | POA: Diagnosis not present

## 2018-04-23 DIAGNOSIS — M25519 Pain in unspecified shoulder: Secondary | ICD-10-CM | POA: Diagnosis not present

## 2018-05-11 DIAGNOSIS — M25511 Pain in right shoulder: Secondary | ICD-10-CM | POA: Diagnosis not present

## 2018-05-11 DIAGNOSIS — M25512 Pain in left shoulder: Secondary | ICD-10-CM | POA: Diagnosis not present

## 2018-05-11 DIAGNOSIS — M19011 Primary osteoarthritis, right shoulder: Secondary | ICD-10-CM | POA: Diagnosis not present

## 2018-05-18 DIAGNOSIS — L710 Perioral dermatitis: Secondary | ICD-10-CM | POA: Diagnosis not present

## 2018-06-23 DIAGNOSIS — F909 Attention-deficit hyperactivity disorder, unspecified type: Secondary | ICD-10-CM | POA: Diagnosis not present

## 2018-06-23 DIAGNOSIS — M2550 Pain in unspecified joint: Secondary | ICD-10-CM | POA: Diagnosis not present

## 2018-06-23 DIAGNOSIS — I10 Essential (primary) hypertension: Secondary | ICD-10-CM | POA: Diagnosis not present

## 2018-07-19 DIAGNOSIS — R000 Tachycardia, unspecified: Secondary | ICD-10-CM | POA: Diagnosis not present

## 2018-07-19 DIAGNOSIS — I10 Essential (primary) hypertension: Secondary | ICD-10-CM | POA: Diagnosis not present

## 2018-07-19 DIAGNOSIS — R252 Cramp and spasm: Secondary | ICD-10-CM | POA: Diagnosis not present

## 2018-07-19 DIAGNOSIS — R52 Pain, unspecified: Secondary | ICD-10-CM | POA: Diagnosis not present

## 2018-08-02 DIAGNOSIS — R05 Cough: Secondary | ICD-10-CM | POA: Diagnosis not present

## 2018-08-02 DIAGNOSIS — R52 Pain, unspecified: Secondary | ICD-10-CM | POA: Diagnosis not present

## 2018-08-06 DIAGNOSIS — I10 Essential (primary) hypertension: Secondary | ICD-10-CM | POA: Diagnosis not present

## 2018-08-19 DIAGNOSIS — N289 Disorder of kidney and ureter, unspecified: Secondary | ICD-10-CM | POA: Diagnosis not present

## 2018-08-19 DIAGNOSIS — R748 Abnormal levels of other serum enzymes: Secondary | ICD-10-CM | POA: Diagnosis not present

## 2018-09-30 DIAGNOSIS — R5383 Other fatigue: Secondary | ICD-10-CM | POA: Diagnosis not present

## 2018-09-30 DIAGNOSIS — R0989 Other specified symptoms and signs involving the circulatory and respiratory systems: Secondary | ICD-10-CM | POA: Diagnosis not present

## 2018-09-30 DIAGNOSIS — R0602 Shortness of breath: Secondary | ICD-10-CM | POA: Diagnosis not present

## 2018-09-30 DIAGNOSIS — F411 Generalized anxiety disorder: Secondary | ICD-10-CM | POA: Diagnosis not present

## 2018-09-30 DIAGNOSIS — R000 Tachycardia, unspecified: Secondary | ICD-10-CM | POA: Diagnosis not present

## 2018-09-30 DIAGNOSIS — I10 Essential (primary) hypertension: Secondary | ICD-10-CM | POA: Diagnosis not present

## 2018-09-30 DIAGNOSIS — F909 Attention-deficit hyperactivity disorder, unspecified type: Secondary | ICD-10-CM | POA: Diagnosis not present

## 2018-09-30 DIAGNOSIS — Z720 Tobacco use: Secondary | ICD-10-CM | POA: Diagnosis not present

## 2018-09-30 DIAGNOSIS — R42 Dizziness and giddiness: Secondary | ICD-10-CM | POA: Diagnosis not present

## 2018-10-02 ENCOUNTER — Other Ambulatory Visit: Payer: Self-pay | Admitting: Family Medicine

## 2018-10-02 DIAGNOSIS — R0989 Other specified symptoms and signs involving the circulatory and respiratory systems: Secondary | ICD-10-CM

## 2018-10-11 ENCOUNTER — Ambulatory Visit
Admission: RE | Admit: 2018-10-11 | Discharge: 2018-10-11 | Disposition: A | Discharge status: Home / Self Care | Payer: Medicare Other | Source: Ambulatory Visit | Attending: Family Medicine | Admitting: Family Medicine

## 2018-10-11 DIAGNOSIS — I6523 Occlusion and stenosis of bilateral carotid arteries: Secondary | ICD-10-CM | POA: Diagnosis not present

## 2018-10-11 DIAGNOSIS — R0989 Other specified symptoms and signs involving the circulatory and respiratory systems: Secondary | ICD-10-CM

## 2018-10-12 DIAGNOSIS — E785 Hyperlipidemia, unspecified: Secondary | ICD-10-CM | POA: Diagnosis not present

## 2018-10-12 DIAGNOSIS — N289 Disorder of kidney and ureter, unspecified: Secondary | ICD-10-CM | POA: Diagnosis not present

## 2018-10-12 DIAGNOSIS — R748 Abnormal levels of other serum enzymes: Secondary | ICD-10-CM | POA: Diagnosis not present

## 2018-10-23 NOTE — Progress Notes (Signed)
Cardiology Office Note   Date:  10/27/2018   ID:  VICIE CECH, DOB [birth year], MRN [id number]  PCP:  London Pepper, MD  Cardiologist: Dr. Johnsie Cancel, MD  Chief Complaint  Patient presents with  . Shortness of Breath    History of Present Illness: Maureen Chung is a 68 y.o. female with a history of tobacco use, family history of premature CAD who was last seen by Dr. Johnsie Cancel 01/14/2017 with complaints of dyspnea, fatigue and LE edema.  She also had some atypical chest pain described as sharp and fleeting with radiation to her left arm.  Her dyspnea was thought to be related to her long-term tobacco use and plans for follow-up were made.  She underwent a Myoview stress test 08/31/2015 which was considered a low risk however with a small area of mild apical ischemia.  Showed 50 to 55% LVEF regional wall motion abnormalities G1 DD.  There was evidence of moderate mitral valve regurgitation at that time. Follow-up cardiac cath performed 10/18/2015 with no significant CAD as below.  She was last seen by pulmonary medicine 10/24/2015 for lung cancer screening program enrollment and has not been seen since that time.  Today, she presents with complaints of exertional dyspnea and fatigue that has been fairly constant since January. She has been followed by her PCP, Dr. Orland Mustard, who placed her on an  antihypertensive during that time. She feels she was doing okay for several months however had issues with elevated BP again last month. She was taken off her prior medication, which she does not recall the name and we do not have records, and placed on Toprol 25 mg daily. Her BP is much more controlled on Toprol at 110/64 today.  She states her SBP runs in the 120 range at home.    Additionally, she has been having issues with lower extremity edema which sounds like dependent edema however we cannot rule out possible LV dysfunction.  She has been wearing compression stockings and elevating her legs  with improvement.  She is not currently on a diuretic.  She states her weight will fluctuate within 3 pounds and she watches this closely.  She is relatively tearful today and appears to have some anxiety about her symptoms.  Her PCP did perform carotid Dopplers which were negative for stenosis.  She has been seen by pulmonary medicine dating back to 2017 however has not followed since that time.  Her last chest CT was 2018.  We discussed continued cardiac work-up along with pulmonary referral would be beneficial at this time to rule out pulmonary issues.  She denies chest pain, palpitations, orthopnea or syncope.  Past Medical History:  Diagnosis Date  . Anxiety   . Arthritis   . Hypercalcemia   . Hyperlipidemia   . Hyperparathyroidism   . Insomnia   . Osteoporosis   . Pericarditis     Past Surgical History:  Procedure Laterality Date  . BREAST CYST EXCISION  1997   benign  . CARDIAC CATHETERIZATION N/A 10/18/2015   Procedure: Left Heart Cath and Coronary Angiography;  Surgeon: Josue Hector, MD;  Location: [facility];  Service: Cardiovascular;  Laterality: N/A;  . child birth  14  . LAPAROSCOPIC ENDOMETRIOSIS FULGURATION  1989, 1995  . PARATHYROIDECTOMY  11/01/10  . TONSILECTOMY, ADENOIDECTOMY, BILATERAL MYRINGOTOMY AND TUBES  1969  . VAGINAL HYSTERECTOMY  1981   without ovaries     Current Outpatient Medications  Medication Sig Dispense  Refill  . amphetamine-dextroamphetamine (ADDERALL) 20 MG tablet Take 20 mg by mouth 2 (two) times daily.     Marland Kitchen aspirin EC 81 MG tablet Take 1 tablet (81 mg total) by mouth daily. 90 tablet 3  . atorvastatin (LIPITOR) 10 MG tablet Take 10 mg by mouth daily.    . Cholecalciferol (VITAMIN D3) 5000 units TABS Take 10,000 Units by mouth daily.     . clonazePAM (KLONOPIN) 0.5 MG tablet Take 0.5 mg by mouth as needed.    . doxepin (SINEQUAN) 10 MG capsule Take 10 mg by mouth at bedtime.    . Loratadine (CLARITIN PO) Take 1 tablet by mouth  daily.    . Magnesium Oxide (MAG-OXIDE PO) Take 1 tablet by mouth daily.    . metoprolol succinate (TOPROL-XL) 25 MG 24 hr tablet Take 25 mg by mouth daily.    . nitroGLYCERIN (NITROSTAT) 0.4 MG SL tablet Place 1 tablet (0.4 mg total) under the tongue every 5 (five) minutes as needed for chest pain. 25 tablet 3  . OVER THE COUNTER MEDICATION Take 30-45 mLs by mouth daily. Omega Liposomal Vitamin C    . sertraline (ZOLOFT) 100 MG tablet Take 200 mg by mouth daily.    . Vitamin Mixture (VITAMIN C) LIQD Take by mouth. 2-3 tbsp qd    . furosemide (LASIX) 20 MG tablet Take 1 tablet (20 mg total) by mouth daily. 90 tablet 3  . potassium chloride SA (K-DUR) 20 MEQ tablet Take 1 tablet (20 mEq total) by mouth daily. 90 tablet 3   No current facility-administered medications for this visit.     Allergies:   Latex    Social History:  The patient  reports that she has been smoking cigarettes. She has a 35.00 pack-year smoking history. She has never used smokeless tobacco. She reports current alcohol use. She reports that she does not use drugs.   Family History:  The patient's family history includes Bladder Cancer in her brother; Cancer in her mother; Coronary artery disease in her brother; Diabetes in her father; Heart disease in her brother and father.    ROS:  Please see the history of present illness.  Otherwise, review of systems are positive for none.   All other systems are reviewed and negative.    PHYSICAL EXAM: VS:  BP 110/64   Pulse 78   Ht 5\' 4"  (1.626 m)   Wt 130 lb (59 kg)   SpO2 95%   BMI 22.31 kg/m  , BMI Body mass index is 22.31 kg/m.   General: Well developed, well nourished, NAD Neck: Negative for carotid bruits. No JVD Lungs:Clear to ausculation bilaterally. No wheezes, rales, or rhonchi. Breathing is unlabored. Cardiovascular: RRR with S1 S2. + murmur Extremities: No edema. No clubbing or cyanosis. DP/PT pulses 2+ bilaterally Neuro: Alert and oriented. No focal  deficits. No facial asymmetry. MAE spontaneously. Psych: Responds to questions appropriately with normal affect.    EKG:  EKG is ordered today. The ekg ordered today demonstrates normal sinus rhythm with possible atrial enlargement, HR 78 bpm.  There is new T wave inversions in inferior lateral leads from 01/14/2017 tracing.  No other tracing on file.  Recent Labs: No results found for requested labs within last 8760 hours.    Lipid Panel No results found for: CHOL, TRIG, HDL, CHOLHDL, VLDL, LDLCALC, LDLDIRECT    Wt Readings from Last 3 Encounters:  10/27/18 130 lb (59 kg)  01/14/17 136 lb 1.9 oz (61.7 kg)  10/18/15  128 lb (58.1 kg)    Other studies Reviewed: Additional studies/ records that were reviewed today include:   Cardiac catheterization 10/18/2015:  Prox LAD to Mid LAD lesion, 20 %stenosed.   No significant CAD Mild calcification of proximal LAD Normal EF 65%  Myoview stress test 08/31/2015:  Nuclear stress EF: 50%.  Upsloping ST segment depression ST segment depression of 1 mm was noted during stress in the V5, V6, III and aVF leads.  This is a low risk study.  The left ventricular ejection fraction is mildly decreased (45-54%).  Findings consistent with ischemia.  Echocardiogram 08/31/2015: - Left ventricle: The cavity size was normal. Wall thickness was   normal. Systolic function was normal. The estimated ejection   fraction was in the range of 50% to 55%. Wall motion was normal;   there were no regional wall motion abnormalities. Doppler   parameters are consistent with abnormal left ventricular   relaxation (grade 1 diastolic dysfunction). The E/e&' ratio is   between 8-15, suggesting indeterminate LV filling pressure. - Mitral valve: Mildly thickened leaflets with elongated and   redundant leaflet tips. There was mild to moderate regurgitation. - Left atrium: The atrium was normal in size. - Atrial septum: Aneurysmal IAS - cannot exclude small PFO. -  Inferior vena cava: The vessel was normal in size. The   respirophasic diameter changes were in the normal range (>= 50%),   consistent with normal central venous pressure.  Impressions:  - LVEF 50-55%, normal wall thickness, normal wall motion, diastolic   dysfunction, indeterminate LV filling pressure, mildly thickened   leaflets with elongated leaflet tips, mild to moderate mitral   regurgitation, normal LA size, aneurysmal IAS - cannot exclude   small PFO, normal IVC.    Small area of mild apical ischemia. ECG positive with 1 mm upsloping ST segment depression Surface images with EF 50% mild apical hypokinesis    ASSESSMENT AND PLAN:  1. Dyspnea: -Reports progressive dyspnea since January 2020 -We will obtain repeat echocardiogram to evaluate for LV function -Start low-dose Lasix 20 mg daily as well as K-Dur 20 mEq daily and assess for symptom improvement -Follow with BMET, BNP -Had normal cardiac cath in 2017 along with normal echocardiogram at that time -Likely her symptoms are related to ongoing tobacco abuse, long-term -Discussed cardiac evaluation.  If abnormal echocardiogram will likely need repeat stress testing versus cardiac cath however it does not appear that she had a significant ischemic event prompting her symptoms as they have been ongoing and chronic for many months.  We will also make a pulmonology referral given long history of tobacco abuse for possible emphysema COPD work-up with PFTs -Needs close follow-up with lung cancer screening CT>> last scan 01/13/2017 with a new subpleural pulmonary nodule in the peripheral left lower lobe measuring 3.8 mm noted to have a benign appearance per radiology review. Low-dose chest CT without contrast recommended 1 year>> this was never obtained  2.  HLD: -Noted to be statin intolerant -Last LDL  3.  Patient reports CKD: -Last creatinine on file in epic thousand 17 with normal creatinine however patient reports that she  has been followed most recently by her PCP with follow-up labs and was told that she has renal dysfunction -We will obtain BMET today and follow  4.  Tobacco abuse: -Continues to smoke -Cessation strongly encouraged -Underwent carotid Dopplers per PCP which were negative for stenosis -Needs follow-up with pulmonary   Current medicines are reviewed at length with the patient  today.  The patient does not have concerns regarding medicines.  The following changes have been made: Add Lasix 20 mg daily along with K. Dur 20 mEq daily  Labs/ tests ordered today include: Echocardiogram, BMET, BNP  Orders Placed This Encounter  Procedures  . Basic metabolic panel  . Pro b natriuretic peptide (BNP)  . Ambulatory referral to Pulmonology  . EKG 12-Lead  . ECHOCARDIOGRAM COMPLETE    Disposition:   FU with Dr. Johnsie Cancel or app in 2 weeks  Signed, Kathyrn Drown, NP  10/27/2018 2:38 PM    [address], [place], [place]  [address] Phone: [phone]; Fax: [phone]

## 2018-10-27 ENCOUNTER — Encounter: Payer: Self-pay | Admitting: Cardiology

## 2018-10-27 ENCOUNTER — Other Ambulatory Visit: Payer: Self-pay

## 2018-10-27 ENCOUNTER — Ambulatory Visit (INDEPENDENT_AMBULATORY_CARE_PROVIDER_SITE_OTHER): Payer: Medicare Other | Admitting: Cardiology

## 2018-10-27 VITALS — BP 110/64 | HR 78 | Ht 64.0 in | Wt 130.0 lb

## 2018-10-27 DIAGNOSIS — Z720 Tobacco use: Secondary | ICD-10-CM | POA: Diagnosis not present

## 2018-10-27 DIAGNOSIS — I10 Essential (primary) hypertension: Secondary | ICD-10-CM

## 2018-10-27 DIAGNOSIS — R5382 Chronic fatigue, unspecified: Secondary | ICD-10-CM

## 2018-10-27 DIAGNOSIS — R0602 Shortness of breath: Secondary | ICD-10-CM

## 2018-10-27 MED ORDER — POTASSIUM CHLORIDE CRYS ER 20 MEQ PO TBCR: 20.0000 meq | EXTENDED_RELEASE_TABLET | Freq: Every day | ORAL | 3 refills | Status: DC

## 2018-10-27 MED ORDER — FUROSEMIDE 20 MG PO TABS: 20.0000 mg | ORAL_TABLET | Freq: Every day | ORAL | 3 refills | Status: DC

## 2018-10-27 NOTE — Patient Instructions (Signed)
Medication Instructions:  Your physician has recommended you make the following change in your medication:  1. START LASIX 20 MEQ DAILY  2. START POTASSIUM 20 MEQ DAILY   If you need a refill on your cardiac medications before your next appointment, please call your pharmacy.   Lab work: TODAY: BMET, BNP If you have labs (blood work) drawn today and your tests are completely normal, you will receive your results only by: Marland Kitchen MyChart Message (if you have MyChart) OR . A paper copy in the mail If you have any lab test that is abnormal or we need to change your treatment, we will call you to review the results.  Testing/Procedures: Your physician has requested that you have an echocardiogram. Echocardiography is a painless test that uses sound waves to create images of your heart. It provides your doctor with information about the size and shape of your heart and how well your heart's chambers and valves are working. This procedure takes approximately one hour. There are no restrictions for this procedure.   Follow-Up: At [facility], you and your health needs are our priority.  As part of our continuing mission to provide you with exceptional heart care, we have created designated Provider Care Teams.  These Care Teams include your primary Cardiologist (physician) and Advanced Practice Providers (APPs -  Physician Assistants and Nurse Practitioners) who all work together to provide you with the care you need, when you need it. . Your physician recommends that you schedule a follow-up appointment in: [place], NP . YOU HAVE BEEN REFERRED TO SEE A PULMONOLOGIST. THEY WILL CALL YOU TO SCHEDULE AN APPOINTMENT.  Any Other Special Instructions Will Be Listed Below (If Applicable).  Echocardiogram An echocardiogram is a procedure that uses painless sound waves (ultrasound) to produce an image of the heart. Images from an echocardiogram can provide important information about:   Signs of coronary artery disease (CAD).  Aneurysm detection. An aneurysm is a weak or damaged part of an artery wall that bulges out from the normal force of blood pumping through the body.  Heart size and shape. Changes in the size or shape of the heart can be associated with certain conditions, including heart failure, aneurysm, and CAD.  Heart muscle function.  Heart valve function.  Signs of a past heart attack.  Fluid buildup around the heart.  Thickening of the heart muscle.  A tumor or infectious growth around the heart valves. Tell a health care provider about:  Any allergies you have.  All medicines you are taking, including vitamins, herbs, eye drops, creams, and over-the-counter medicines.  Any blood disorders you have.  Any surgeries you have had.  Any medical conditions you have.  Whether you are pregnant or may be pregnant. What are the risks? Generally, this is a safe procedure. However, problems may occur, including:  Allergic reaction to dye (contrast) that may be used during the procedure. What happens before the procedure? No specific preparation is needed. You may eat and drink normally. What happens during the procedure?   An IV tube may be inserted into one of your veins.  You may receive contrast through this tube. A contrast is an injection that improves the quality of the pictures from your heart.  A gel will be applied to your chest.  A wand-like tool (transducer) will be moved over your chest. The gel will help to transmit the sound waves from the transducer.  The sound waves will harmlessly bounce  off of your heart to allow the heart images to be captured in real-time motion. The images will be recorded on a computer. The procedure may vary among health care providers and hospitals. What happens after the procedure?  You may return to your normal, everyday life, including diet, activities, and medicines, unless your health care provider  tells you not to do that. Summary  An echocardiogram is a procedure that uses painless sound waves (ultrasound) to produce an image of the heart.  Images from an echocardiogram can provide important information about the size and shape of your heart, heart muscle function, heart valve function, and fluid buildup around your heart.  You do not need to do anything to prepare before this procedure. You may eat and drink normally.  After the echocardiogram is completed, you may return to your normal, everyday life, unless your health care provider tells you not to do that. This information is not intended to replace advice given to you by your health care provider. Make sure you discuss any questions you have with your health care provider. Document Released: 02/29/2000 Document Revised: 06/24/2018 Document Reviewed: 04/05/2016 Elsevier Patient Education  2020 [employer].

## 2018-10-28 ENCOUNTER — Telehealth: Payer: Self-pay | Admitting: *Deleted

## 2018-10-28 LAB — BASIC METABOLIC PANEL
BUN/Creatinine Ratio: 23 (ref 12–28)
BUN: 24 mg/dL (ref 8–27)
CO2: 21 mmol/L (ref 20–29)
Calcium: 9.3 mg/dL (ref 8.7–10.3)
Chloride: 105 mmol/L (ref 96–106)
Creatinine, Ser: 1.05 mg/dL — ABNORMAL HIGH (ref 0.57–1.00)
GFR calc Af Amer: 64 mL/min/{1.73_m2} (ref >59)
GFR calc non Af Amer: 55 mL/min/{1.73_m2} — ABNORMAL LOW (ref >59)
Glucose: 92 mg/dL (ref 65–99)
Potassium: 4.7 mmol/L (ref 3.5–5.2)
Sodium: 141 mmol/L (ref 134–144)

## 2018-10-28 LAB — PRO B NATRIURETIC PEPTIDE: NT-Pro BNP: 1536 pg/mL — ABNORMAL HIGH (ref 0–301)

## 2018-10-28 MED ORDER — FUROSEMIDE 40 MG PO TABS: 40.0000 mg | ORAL_TABLET | Freq: Every day | ORAL | 3 refills | Status: DC

## 2018-10-28 NOTE — Telephone Encounter (Signed)
Spoke with the pt and informed her of lab results and recommendations per Kathyrn Drown NP, for her to increase her lasix to 40 mg po daily, and continue her already prescribed KDUR.  Confirmed the pharmacy of choice with the pt, and placed a note to the pharmacy that the pt will  use her current supply of 20 mg tablets on hand, and will call for further refills of new dose change to 40 mg tablets.  Pt verbalized understanding and agrees with this plan.

## 2018-10-28 NOTE — Telephone Encounter (Signed)
-----   Message from Tommie Raymond, NP sent at 10/28/2018 10:26 AM EDT ----- Please let her know that her kidney function was very mildly elevated but still in a stable range. Her fluid level lab work was elevated. Given that, lets have her increase her Lasix to 40mg  daily along with the already prescribed Kdur. We will contact her once her echo results are in   Thank you  Sharee Pimple

## 2018-11-02 ENCOUNTER — Ambulatory Visit (HOSPITAL_COMMUNITY): Payer: Medicare Other | Attending: Cardiology

## 2018-11-02 ENCOUNTER — Other Ambulatory Visit: Payer: Self-pay

## 2018-11-02 DIAGNOSIS — R0602 Shortness of breath: Secondary | ICD-10-CM | POA: Insufficient documentation

## 2018-11-04 DIAGNOSIS — N289 Disorder of kidney and ureter, unspecified: Secondary | ICD-10-CM | POA: Diagnosis not present

## 2018-11-06 NOTE — Progress Notes (Signed)
Cardiology Office Note   Date:  11/10/2018   ID:  Maureen Chung, DOB 06-21-1950, MRN [id number]  PCP:  London Pepper, MD  Cardiologist:  Dr. Johnsie Cancel, MD  Chief Complaint  Patient presents with  . Follow-up  . Shortness of Breath    History of Present Illness: Maureen Chung is a 68 y.o. female with a history of tobacco use, family history of premature CAD who was last seen by Dr. Johnsie Cancel 01/14/2017 with complaints of dyspnea, fatigue and LE edema.  She also had some atypical chest pain described as sharp and fleeting with radiation to her left arm.  Her dyspnea was thought to be related to her long-term tobacco use and plans for follow-up were made.  She underwent a Myoview stress test 08/31/2015 which was considered a low risk however with a small area of mild apical ischemia.  Showed 50 to 55% LVEF regional wall motion abnormalities G1 DD.  There was evidence of moderate mitral valve regurgitation at that time. Follow-up cardiac cath performed 10/18/2015 with no significant CAD as below.  She was last seen by pulmonary medicine 10/24/2015 for lung cancer screening program enrollment and has not been seen since that time.  She was last seen by myself on 10/27/2018 with complaints of exertional dyspnea and fatigue that has been fairly constant since January. She followed with PCP, Dr. Orland Mustard, who placed her on an antihypertensive during that time. She felt she was doing okay for several months however began having issues with elevated BP again last month. She was taken off her prior medication, which she does not recall the name and we do not have records, and placed on Toprol 25 mg daily. Her BP was noted to be much more controlled. She stated her SBP runs in the 120 range at home.    Additionally, she was having issues with lower extremity edema which sounds like dependent edema however we cannot rule out possible LV dysfunction.  She had been wearing compression stockings and  elevating her legs with improvement. Was not previously on a diuretic. She stated that her weight would fluctuate within 3 pounds and she watches this closely. Her PCP did perform carotid Dopplers which were negative for stenosis. Further cardiac work-up along with pulmonary referral discussed and thought to be beneficial to rule out pulmonary issues.    Today she presents for 2-week follow-up.  Her echocardiogram was performed 11/02/2018 with normal LVEF at 65%.  There was no regional wall motion abnormalities and no valvular disease.  BNP was noted to be markedly elevated at 1536.  Given this, her p.o. Lasix was increased from 20 mg daily to 40 mg daily.  She reports having lab work done by her PCP on 11/04/2018.  It appears her creatinine elevated from 1.05-1.36 indicating she is a little on the dry side.  She states the shortness of breath is mildly reduced however not significantly.  She denies anginal symptoms.  Reports LE swelling however describes it more as an dependent edema after being on her feet most of the day.  She has no dizziness, palpitations or syncope.   Also states she has been under tremendous amount of stress.  She sees a psychiatrist and psychologist for ongoing issues with anxiety and depression.  She has an appointment in 2 days for further discussion.  Likely her symptoms are multifactorial with respiratory and psychological concerns.  Past Medical History:  Diagnosis Date  . Anxiety   . Arthritis   .  Hypercalcemia   . Hyperlipidemia   . Hyperparathyroidism   . Insomnia   . Osteoporosis   . Pericarditis     Past Surgical History:  Procedure Laterality Date  . BREAST CYST EXCISION  1997   benign  . CARDIAC CATHETERIZATION N/A 10/18/2015   Procedure: Left Heart Cath and Coronary Angiography;  Surgeon: Josue Hector, MD;  Location: [facility];  Service: Cardiovascular;  Laterality: N/A;  . child birth  [birth year]  . LAPAROSCOPIC ENDOMETRIOSIS FULGURATION  1989, 1995   . PARATHYROIDECTOMY  11/01/10  . TONSILECTOMY, ADENOIDECTOMY, BILATERAL MYRINGOTOMY AND TUBES  1969  . VAGINAL HYSTERECTOMY  1981   without ovaries     Current Outpatient Medications  Medication Sig Dispense Refill  . amphetamine-dextroamphetamine (ADDERALL) 20 MG tablet Take 20 mg by mouth 2 (two) times daily.     Marland Kitchen aspirin EC 81 MG tablet Take 1 tablet (81 mg total) by mouth daily. 90 tablet 3  . atorvastatin (LIPITOR) 10 MG tablet Take 10 mg by mouth daily.    . Cholecalciferol (VITAMIN D3) 5000 units TABS Take 10,000 Units by mouth daily.     . clonazePAM (KLONOPIN) 0.5 MG tablet Take 0.5 mg by mouth as needed.    . doxepin (SINEQUAN) 10 MG capsule Take 10 mg by mouth at bedtime.    . Loratadine (CLARITIN PO) Take 1 tablet by mouth daily.    . Magnesium Oxide (MAG-OXIDE PO) Take 1 tablet by mouth daily.    . metoprolol succinate (TOPROL-XL) 25 MG 24 hr tablet Take 25 mg by mouth daily.    . nitroGLYCERIN (NITROSTAT) 0.4 MG SL tablet Place 1 tablet (0.4 mg total) under the tongue every 5 (five) minutes as needed for chest pain. 25 tablet 3  . potassium chloride SA (K-DUR) 20 MEQ tablet Take 1 tablet (20 mEq total) by mouth daily. 90 tablet 3  . sertraline (ZOLOFT) 100 MG tablet Take 200 mg by mouth daily.    . furosemide (LASIX) 20 MG tablet Take 1 tablet (20 mg total) by mouth daily. 90 tablet 3   No current facility-administered medications for this visit.     Allergies:   Latex    Social History:  The patient  reports that she has been smoking cigarettes. She has a 35.00 pack-year smoking history. She has never used smokeless tobacco. She reports current alcohol use. She reports that she does not use drugs.   Family History:  The patient's family history includes Bladder Cancer in her brother; Cancer in her mother; Coronary artery disease in her brother; Diabetes in her father; Heart disease in her brother and father.    ROS:  Please see the history of present illness.   Otherwise, review of systems are positive for none.   All other systems are reviewed and negative.    PHYSICAL EXAM: VS:  BP 128/60   Pulse 87   Ht 5' 3.5" (1.613 m)   Wt 124 lb 6.4 oz (56.4 kg)   BMI 21.69 kg/m  , BMI Body mass index is 21.69 kg/m.   General: Well developed, well nourished, NAD Neck: Negative for carotid bruits. No JVD Lungs:Clear to ausculation bilaterally. No wheezes, rales, or rhonchi. Breathing is unlabored. Cardiovascular: RRR with S1 S2. No murmurs, rubs, gallops, or LV heave appreciated. Extremities: No edema. No clubbing or cyanosis. DP/PT pulses 2+ bilaterally Neuro: Alert and oriented. No focal deficits. No facial asymmetry. MAE spontaneously. Psych: Responds to questions appropriately with normal affect.  EKG:  EKG is not ordered today.   Recent Labs: 10/27/2018: BUN 24; Creatinine, Ser 1.05; NT-Pro BNP 1,536; Potassium 4.7; Sodium 141    Lipid Panel No results found for: CHOL, TRIG, HDL, CHOLHDL, VLDL, LDLCALC, LDLDIRECT    Wt Readings from Last 3 Encounters:  11/10/18 124 lb 6.4 oz (56.4 kg)  10/27/18 130 lb (59 kg)  01/14/17 136 lb 1.9 oz (61.7 kg)     Other studies Reviewed: Additional studies/ records that were reviewed today include:   Cardiac catheterization 10/18/2015:  Prox LAD to Mid LAD lesion, 20 %stenosed.  No significant CAD Mild calcification of proximal LAD Normal EF 65%  Myoview stress test 08/31/2015:  Nuclear stress EF: 50%.  Upsloping ST segment depression ST segment depression of 1 mm was noted during stress in the V5, V6, III and aVF leads.  This is a low risk study.  The left ventricular ejection fraction is mildly decreased (45-54%).  Findings consistent with ischemia.  Echocardiogram 08/31/2015: - Left ventricle: The cavity size was normal. Wall thickness was normal. Systolic function was normal. The estimated ejection fraction was in the range of 50% to 55%. Wall motion was normal; there  were no regional wall motion abnormalities. Doppler parameters are consistent with abnormal left ventricular relaxation (grade 1 diastolic dysfunction). The E/e&' ratio is between 8-15, suggesting indeterminate LV filling pressure. - Mitral valve: Mildly thickened leaflets with elongated and redundant leaflet tips. There was mild to moderate regurgitation. - Left atrium: The atrium was normal in size. - Atrial septum: Aneurysmal IAS - cannot exclude small PFO. - Inferior vena cava: The vessel was normal in size. The respirophasic diameter changes were in the normal range (>= 50%), consistent with normal central venous pressure.  Impressions:  - LVEF 50-55%, normal wall thickness, normal wall motion, diastolic dysfunction, indeterminate LV filling pressure, mildly thickened leaflets with elongated leaflet tips, mild to moderate mitral regurgitation, normal LA size, aneurysmal IAS - cannot exclude small PFO, normal IVC.  Small area of mild apical ischemia. ECG positive with 1 mm upsloping ST segment depression Surface images with EF 50% mild apical hypokinesis   ASSESSMENT AND PLAN:  1. Dyspnea: -Reports mild improvement in symptoms however overall about the same.  Echocardiogram was found to be within normal limits.  BNP was moderately elevated.  We increased her Lasix from 20 mg daily to 40 mg daily however patient had lab work performed at PCP with an elevation in her creatinine.  -Given this we will reduce her Lasix back down to 20 mg daily -She has an upcoming appointment with pulmonary medicine next week for PFTs -Appears similar symptoms are likely respiratory/anxiety in nature   2.  HLD: -Last LDL, 89>>> placed on statin per PCP  3.  Patient reports CKD: -Last creatinine on 10/27/2018 with creatinine 1.0 5 repeat by PCP was 1.36  -Given this we will reduce Lasix to 20 mg daily and follow-up in 1 month after seen by pulmonary and psychology   4.   Tobacco abuse: -Continues to smoke -Cessation strongly encouraged -Underwent carotid Dopplers per PCP which were negative for stenosis -Needs close follow-up with lung cancer screening CT>> last scan 01/13/2017 with a new subpleural pulmonary nodule in the peripheral left lower lobe measuring 3.8 mm noted to have a benign appearance per radiology review. Low-dose chest CT without contrast recommended 1 year>> this was never obtained -Likely needs PFTs   Current medicines are reviewed at length with the patient today.  The patient does  not have concerns regarding medicines.  The following changes have been made: Decrease Lasix to 20 mg daily  Labs/ tests ordered today include: None No orders of the defined types were placed in this encounter.   Disposition:   FU with myself in 1 month    Signed, Kathyrn Drown, NP  11/10/2018 3:36 PM    [facility] [address], [place], [place]  [address] Phone: [phone]; Fax: [phone]

## 2018-11-09 ENCOUNTER — Institutional Professional Consult (permissible substitution): Payer: BLUE CROSS/BLUE SHIELD | Admitting: Internal Medicine

## 2018-11-10 ENCOUNTER — Other Ambulatory Visit: Payer: Self-pay

## 2018-11-10 ENCOUNTER — Ambulatory Visit (INDEPENDENT_AMBULATORY_CARE_PROVIDER_SITE_OTHER): Payer: Medicare Other | Admitting: Cardiology

## 2018-11-10 ENCOUNTER — Encounter: Payer: Self-pay | Admitting: Cardiology

## 2018-11-10 VITALS — BP 128/60 | HR 87 | Ht 63.5 in | Wt 124.4 lb

## 2018-11-10 DIAGNOSIS — R0602 Shortness of breath: Secondary | ICD-10-CM

## 2018-11-10 DIAGNOSIS — I10 Essential (primary) hypertension: Secondary | ICD-10-CM | POA: Diagnosis not present

## 2018-11-10 DIAGNOSIS — Z720 Tobacco use: Secondary | ICD-10-CM | POA: Diagnosis not present

## 2018-11-10 MED ORDER — FUROSEMIDE 20 MG PO TABS: 20.0000 mg | ORAL_TABLET | Freq: Every day | ORAL | 3 refills | Status: DC

## 2018-11-10 NOTE — Patient Instructions (Signed)
Medication Instructions:  Your physician has recommended you make the following change in your medication:  1.  REDUCE the Lasix to 20 mg daily.  If you need a refill on your cardiac medications before your next appointment, please call your pharmacy.   Lab work: None ordered  If you have labs (blood work) drawn today and your tests are completely normal, you will receive your results only by: Marland Kitchen MyChart Message (if you have MyChart) OR . A paper copy in the mail If you have any lab test that is abnormal or we need to change your treatment, we will call you to review the results.  Testing/Procedures: None ordered  Follow-Up: At [facility], you and your health needs are our priority.  As part of our continuing mission to provide you with exceptional heart care, we have created designated Provider Care Teams.  These Care Teams include your primary Cardiologist (physician) and Advanced Practice Providers (APPs -  Physician Assistants and Nurse Practitioners) who all work together to provide you with the care you need, when you need it. Marland Kitchen You are scheduled for a in-office follow-up appointment with Maureen Chung, 12/07/2018 at 1:30  Any Other Special Instructions Will Be Listed Below (If Applicable).

## 2018-11-15 ENCOUNTER — Encounter: Payer: Self-pay | Admitting: Internal Medicine

## 2018-11-15 ENCOUNTER — Ambulatory Visit (INDEPENDENT_AMBULATORY_CARE_PROVIDER_SITE_OTHER): Payer: Medicare Other

## 2018-11-15 ENCOUNTER — Ambulatory Visit (INDEPENDENT_AMBULATORY_CARE_PROVIDER_SITE_OTHER): Payer: Medicare Other | Admitting: Internal Medicine

## 2018-11-15 ENCOUNTER — Other Ambulatory Visit: Payer: Self-pay

## 2018-11-15 ENCOUNTER — Encounter: Payer: Self-pay | Admitting: *Deleted

## 2018-11-15 DIAGNOSIS — R05 Cough: Secondary | ICD-10-CM | POA: Diagnosis not present

## 2018-11-15 DIAGNOSIS — J449 Chronic obstructive pulmonary disease, unspecified: Secondary | ICD-10-CM | POA: Insufficient documentation

## 2018-11-15 DIAGNOSIS — F17210 Nicotine dependence, cigarettes, uncomplicated: Secondary | ICD-10-CM | POA: Diagnosis not present

## 2018-11-15 MED ORDER — BUDESONIDE-FORMOTEROL FUMARATE 160-4.5 MCG/ACT IN AERO: 2.0000 | INHALATION_SPRAY | Freq: Two times a day (BID) | RESPIRATORY_TRACT | 11 refills | Status: DC

## 2018-11-15 MED ORDER — BUDESONIDE-FORMOTEROL FUMARATE 160-4.5 MCG/ACT IN AERO: 2.0000 | INHALATION_SPRAY | Freq: Two times a day (BID) | RESPIRATORY_TRACT | 0 refills | Status: DC

## 2018-11-15 NOTE — Progress Notes (Signed)
Maureen Chung, female    DOB: October 18, 1950    MRN: [id number]   Brief patient profile:  66 yowm active smoker healthy childhood very active with nasal congestion / drippy nose spring and fall onset in early 90s then more cough/ sob since 01/2016 lost ground since never tried inhaler rx referred to pulmonary clinic 11/15/2018 by  Kathyrn Drown NP    History of Present Illness  11/15/2018  Pulmonary/ 1st office eval/  Chief Complaint  Patient presents with  . Pulmonary Consult    Referred by Kathyrn Drown, NP.  Pt c/o cough, SOB and fatigue since Nov 2017.   Her cough is prod with yellow sputum. She states she gets SOB while gardening and walking outside.   Dyspnea:  MMRC1 = can walk nl pace, flat grade, can't hurry or go uphills or steps s sob   Cough: rattle with slt yellow mucus esp after supper  Sleep: able to lie flat /1 pillow / worse congestion in am SABA use: never  Husband also smokes.   No obvious day to day or daytime variability or assoc  or mucus plugs or hemoptysis or cp or chest tightness, subjective wheeze or overt sinus or hb symptoms.   sleeping without nocturnal  exacerbation  of respiratory  c/o's or need for noct saba. Also denies any obvious fluctuation of symptoms with weather or environmental changes or other aggravating or alleviating factors except as outlined above   No unusual exposure hx or h/o childhood pna/ asthma or knowledge of premature birth.  Current Allergies, Complete Past Medical History, Past Surgical History, Family History, and Social History were reviewed in [employer] record.  ROS  The following are not active complaints unless bolded Hoarseness, sore throat, dysphagia, dental problems, itching, sneezing,  nasal congestion or discharge of excess mucus or purulent secretions, ear ache,   fever, chills, sweats, unintended wt loss or wt gain, classically pleuritic or exertional cp,  orthopnea pnd or arm/hand swelling   or leg swelling, presyncope, palpitations, abdominal pain, anorexia, nausea, vomiting, diarrhea  or change in bowel habits or change in bladder habits, change in stools or change in urine, dysuria, hematuria,  rash, arthralgias, visual complaints, headache, numbness, weakness or ataxia or problems with walking or coordination,  change in mood or  memory.             Past Medical History:  Diagnosis Date  . Anxiety   . Arthritis   . Hypercalcemia   . Hyperlipidemia   . Hyperparathyroidism   . Insomnia   . Osteoporosis   . Pericarditis     Outpatient Medications Prior to Visit  Medication Sig Dispense Refill  . amphetamine-dextroamphetamine (ADDERALL) 20 MG tablet Take 20 mg by mouth 2 (two) times daily.     Marland Kitchen aspirin EC 81 MG tablet Take 1 tablet (81 mg total) by mouth daily. 90 tablet 3  . atorvastatin (LIPITOR) 10 MG tablet Take 10 mg by mouth daily.    . Cholecalciferol (VITAMIN D3) 5000 units TABS Take 10,000 Units by mouth daily.     . clonazePAM (KLONOPIN) 0.5 MG tablet Take 0.5 mg by mouth as needed.    . furosemide (LASIX) 20 MG tablet Take 1 tablet (20 mg total) by mouth daily. 90 tablet 3  . Loratadine (CLARITIN PO) Take 1 tablet by mouth daily as needed.     . Magnesium Oxide (MAG-OXIDE PO) Take 1 tablet by mouth daily.    . metoprolol succinate (  TOPROL-XL) 25 MG 24 hr tablet Take 25 mg by mouth daily.    . nitroGLYCERIN (NITROSTAT) 0.4 MG SL tablet Place 1 tablet (0.4 mg total) under the tongue every 5 (five) minutes as needed for chest pain. 25 tablet 3  . potassium chloride SA (K-DUR) 20 MEQ tablet Take 1 tablet (20 mEq total) by mouth daily. 90 tablet 3  . sertraline (ZOLOFT) 100 MG tablet Take 200 mg by mouth daily.    Marland Kitchen doxepin (SINEQUAN) 10 MG capsule Take 10 mg by mouth at bedtime.           Objective:     BP 134/74 (BP Location: Left Arm, Cuff Size: Normal)   Pulse 85   Temp 98 F (36.7 C) (Oral)   Ht 5' 4.5" (1.638 m)   Wt 124 lb (56.2 kg)   SpO2 98%    BMI 20.96 kg/m   SpO2: 98 % RA  Wt Readings from Last 3 Encounters:  11/15/18 124 lb (56.2 kg)  11/10/18 124 lb 6.4 oz (56.4 kg)  10/27/18 130 lb (59 kg)      HEENT : pt wearing mask not removed for exam due to covid - 19 concerns.     HEENT: nl dentition / oropharynx. Nl external ear canals without cough reflex -  Mild bilateral non-specific turbinate edema     NECK :  without JVD/Nodes/TM/ nl carotid upstrokes bilaterally   LUNGS: no acc muscle use,  Mild barrel  contour chest wall with bilateral  Distant mid exp wheezes and  without cough on insp or exp maneuvers  and mild  Hyperresonant  to  percussion bilaterally     CV:  RRR  no s3 or murmur or increase in P2, and no edema   ABD:  soft and nontender with pos end  insp Hoover's  in the supine position. No bruits or organomegaly appreciated, bowel sounds nl  MS:   Nl gait/  ext warm without deformities, calf tenderness, cyanosis or clubbing No obvious joint restrictions   SKIN: warm and dry without lesions    NEURO:  alert, approp, nl sensorium with  no motor or cerebellar deficits apparent.           CXR PA and Lateral:   11/15/2018 :    I personally reviewed images and agree with radiology impression as follows:    No active cardiopulmonary disease.   Labs   reviewed:      Chemistry      Component Value Date/Time   NA 141 10/27/2018 1416   K 4.7 10/27/2018 1416   CL 105 10/27/2018 1416   CO2 21 10/27/2018 1416   BUN 24 10/27/2018 1416   CREATININE 1.05 (H) 10/27/2018 1416      Component Value Date/Time   CALCIUM 9.3 10/27/2018 1416   CALCIUM 10.0 01/03/2011 1504        Lab Results  Component Value Date   WBC 9.9 10/09/2015   HGB 12.2 10/09/2015   HCT 37.4 10/09/2015   MCV 96.1 10/09/2015   PLT 248 10/09/2015                    Assessment   COPD with AB component/ still smoking/ pft pending Active smoker - .11/15/2018  After extensive coaching inhaler device,  effectiveness =     75% try symbicort 160 2bid   DDX of  difficult airways management almost all start with A and  include Adherence, Ace Inhibitors, Acid Reflux, Active  Sinus Disease, Alpha 1 Antitripsin deficiency, Anxiety masquerading as Airways dz,  ABPA,  Allergy(esp in young), Aspiration (esp in elderly), Adverse effects of meds,  Active smoking or vaping, A bunch of PE's (a small clot burden can't cause this syndrome unless there is already severe underlying pulm or vascular dz with poor reserve) plus two Bs  = Bronchiectasis and Beta blocker use..and one C= CHF  Adherence is always the initial "prime suspect" and is a multilayered concern that requires a "trust but verify" approach in every patient - starting with knowing how to use medications, especially inhalers, correctly, keeping up with refills and understanding the fundamental difference between maintenance and prns vs those medications only taken for a very short course and then stopped and not refilled.  - see hfa teaching - return with all meds in hand using a trust but verify approach to confirm accurate Medication  Reconciliation The principal here is that until we are certain that the  patients are doing what we've asked, it makes no sense to ask them to do more.    Active smoking > top of the rest of list   ? Allergy/asthma component > high dose ICS? Continue clariton  ? Anxiety > usually at the bottom of this list of usual suspects but should be  higher on this pt's based on H and P and note already on psychotropics and may interfere with ability to quit smoking and  adherence and also interpretation of response or lack thereof to symptom management which can be quite subjective.   ? BB effects > unlikely on low dose toprol   ? CHF > echo reviewed from 11/02/18 ok but could still have component of diastolic dysfunction or cor pulmonale     Cigarette smoker Counseled re importance of smoking cessation but did not meet time criteria for  separate billing    Total time devoted to counseling  > 50 % of initial 60 min office visit:  reviewed case with pt/  performed device teaching  using a teach back technique which also  extended face to face time for this visit (see above)  discussion of options/alternatives/ personally creating written customized instructions  in presence of pt  then going over those specific  Instructions directly with the pt including how to use all of the meds but in particular covering each new medication in detail and the difference between the maintenance= "automatic" meds and the prns using an action plan format for the latter (If this problem/symptom => do that organization reading Left to right).  Please see AVS from this visit for a full list of these instructions which I personally wrote for this pt and  are unique to this visit.    Christinia Gully, MD 11/15/2018

## 2018-11-15 NOTE — Patient Instructions (Signed)
Symbicort 160 Take 2 puffs first thing in am and then another 2 puffs about 12 hours later.     Work on inhaler technique:  relax and gently blow all the way out then take a nice smooth deep breath back in, triggering the inhaler at same time you start breathing in.  Hold for up to 5 seconds if you can. Blow out thru nose. Rinse and gargle with water when done  Please remember to go to the  x-ray department  for your tests - we will call you with the results when they are available    The key is to stop smoking completely before smoking completely stops you!  For smoking cessation information call [phone]       Please schedule a follow up office visit in 4 weeks, sooner if needed with pfts on return

## 2018-11-16 ENCOUNTER — Encounter: Payer: Self-pay | Admitting: Internal Medicine

## 2018-11-16 DIAGNOSIS — F17210 Nicotine dependence, cigarettes, uncomplicated: Secondary | ICD-10-CM | POA: Insufficient documentation

## 2018-11-16 DIAGNOSIS — Z87891 Personal history of nicotine dependence: Secondary | ICD-10-CM | POA: Insufficient documentation

## 2018-11-16 NOTE — Assessment & Plan Note (Signed)
Active smoker - .11/15/2018  After extensive coaching inhaler device,  effectiveness =    75% try symbicort 160 2bid   DDX of  difficult airways management almost all start with A and  include Adherence, Ace Inhibitors, Acid Reflux, Active Sinus Disease, Alpha 1 Antitripsin deficiency, Anxiety masquerading as Airways dz,  ABPA,  Allergy(esp in young), Aspiration (esp in elderly), Adverse effects of meds,  Active smoking or vaping, A bunch of PE's (a small clot burden can't cause this syndrome unless there is already severe underlying pulm or vascular dz with poor reserve) plus two Bs  = Bronchiectasis and Beta blocker use..and one C= CHF  Adherence is always the initial "prime suspect" and is a multilayered concern that requires a "trust but verify" approach in every patient - starting with knowing how to use medications, especially inhalers, correctly, keeping up with refills and understanding the fundamental difference between maintenance and prns vs those medications only taken for a very short course and then stopped and not refilled.  - see hfa teaching - return with all meds in hand using a trust but verify approach to confirm accurate Medication  Reconciliation The principal here is that until we are certain that the  patients are doing what we've asked, it makes no sense to ask them to do more.    Active smoking > top of the rest of list   ? Allergy/asthma component > high dose ICS? Continue clariton  ? Anxiety > usually at the bottom of this list of usual suspects but should be  higher on this pt's based on H and P and note already on psychotropics and may interfere with ability to quit smoking and  adherence and also interpretation of response or lack thereof to symptom management which can be quite subjective.   ? BB effects > unlikely on low dose toprol   ? CHF > echo reviewed from 11/02/18 ok but could still have component of diastolic dysfunction or cor pulmonale

## 2018-11-16 NOTE — Assessment & Plan Note (Signed)
Counseled re importance of smoking cessation but did not meet time criteria for separate billing      Total time devoted to counseling  > 50 % of initial 60 min office visit:  reviewed case with pt/ performed device teaching  using a teach back technique which also  extended face to face time for this visit (see above)  discussion of options/alternatives/ personally creating written customized instructions  in presence of pt  then going over those specific  Instructions directly with the pt including how to use all of the meds but in particular covering each new medication in detail and the difference between the maintenance= "automatic" meds and the prns using an action plan format for the latter (If this problem/symptom => do that organization reading Left to right).  Please see AVS from this visit for a full list of these instructions which I personally wrote for this pt and  are unique to this visit.  

## 2018-11-23 NOTE — Progress Notes (Signed)
mychart note sent

## 2018-11-24 ENCOUNTER — Telehealth: Payer: Self-pay | Admitting: Internal Medicine

## 2018-11-24 NOTE — Telephone Encounter (Signed)
Notes recorded by Tanda Rockers, MD on 11/16/2018 at 8:38 AM EDT  Call pt: Reviewed cxr and no acute change so no change in recommendations made at [facility]   [facility]

## 2018-11-24 NOTE — Telephone Encounter (Signed)
ATC Patient.  Left message to call back when available.  

## 2018-11-24 NOTE — Telephone Encounter (Signed)
Patient is returning phone call.  Patient phone number is [phone].

## 2018-11-24 NOTE — Telephone Encounter (Signed)
Patient is returning phone call.  Patient phone number is [phone].

## 2018-11-24 NOTE — Telephone Encounter (Signed)
Called spoke with patient. She verbalized understanding of CXR. She also asked question related to a PFT or lung test with her next appointment. Looking at last OV AVS Dr. Melvyn Novas wants PFTs with return visit. Patient' appt is 12/13/18 will route this to [facility] to see if there is availability.

## 2018-11-25 NOTE — Telephone Encounter (Signed)
Patient scheduled for pft on 9/28-pr

## 2018-11-29 NOTE — Progress Notes (Signed)
Cardiology Office Note   Date:  12/07/2018   ID:  [name], DOB 07/06/1950, MRN [id number]  PCP:  London Pepper, MD  Cardiologist: Dr. Johnsie Cancel, MD   Chief Complaint  Patient presents with  . Follow-up   History of Present Illness: Maureen Chung is a 68 y.o. female with a history of tobacco use, family history of premature CAD whowas lastseen by Dr.Nishan10/31/2018 with complaints of dyspnea, fatigue and LE edema. She also had some atypical chest pain described as sharp and fleeting with radiation to her left arm. Her dyspnea was thought to be related to her long-term tobacco use and plans for follow-up were made.   She underwent a Myoview stress test 08/31/2015 which was considered a low risk however with a small area of mild apical ischemia which showed 50 to 55% LVEF and regional wall motion abnormalities with G1 DD. There was evidence of moderate mitral valve regurgitation at that time. Follow-up cardiac cath performed 10/18/2015 with no significant CAD as below.  She was last seen by pulmonary medicine 10/24/2015 for lung cancer screening program enrollment and has not been seen since that time.  She was then seen by myself on 10/27/2018 with complaints ofexertional dyspneaand fatigue that has been fairly constant since January 2020. She followed with PCP, Dr. Jacqulyn Cane placed her on anantihypertensive during that time. She felt she was doing okay for several months however began having issues with elevated BP once again. She was taken off her prior medication and placed on Toprol 25 mg daily. Her BP was noted to be much more controlled. She stated her SBP runs in the 120 range at home.   Additionally, she was having issues with lower extremity edema which sounded like dependent edema possible LV dysfunction could not be excluded. She had been wearing compression stockings and elevating her legs with improvement. Her PCP did perform carotid Dopplers which were  negative for stenosis. Further cardiac work-up along with pulmonary referral discussed and thought to be beneficial to rule out pulmonary issues.   On 11/10/2018 she was seen once again for 2-week follow-up.  Her echocardiogram was performed 11/02/2018 with normal LVEF at 65%. There was no regional wall motion abnormalities and no valvular disease.  BNP was noted to be markedly elevated at 1536.  Given this, her p.o. Lasix was increased from 20 mg daily to 40 mg daily.   Also stated that she had been under tremendous amount of stress. She was following with a psychiatrist and psychologist for ongoing issues with anxiety and depression.  Likely her symptoms were  multifactorial with respiratory and psychological concerns.  She was seen by Dr. Melvyn Novas on 11/15/2018. She was started on Symbicort for possible COPD. PFTs scheduled for 12/13/2018.    Today she presents for follow-up and is doing well from a cardiac perspective.  She states she has been seeing her psychiatrist once a week since her last visit and this is really helping her.  She appears that she is doing much better than the last time I saw her.  Her dyspnea is improved along with her very mild LE swelling.  Plan for pulmonary as above.  She denies chest pain, orthopnea, dizziness, nausea, vomiting or syncope.  Reports she was seen by her PCP after her last visit and her creatinine was mildly elevated to 1.3.  We discussed changing her Lasix to as needed given that she is euvolemic on exam with a normal echocardiogram.  Past Medical  History:  Diagnosis Date  . Anxiety   . Arthritis   . Hypercalcemia   . Hyperlipidemia   . Hyperparathyroidism   . Insomnia   . Osteoporosis   . Pericarditis     Past Surgical History:  Procedure Laterality Date  . BREAST CYST EXCISION  1997   benign  . CARDIAC CATHETERIZATION N/A 10/18/2015   Procedure: Left Heart Cath and Coronary Angiography;  Surgeon: Josue Hector, MD;  Location: [facility];  Service: Cardiovascular;  Laterality: N/A;  . child birth  19  . LAPAROSCOPIC ENDOMETRIOSIS FULGURATION  1989, 1995  . PARATHYROIDECTOMY  11/01/10  . TONSILECTOMY, ADENOIDECTOMY, BILATERAL MYRINGOTOMY AND TUBES  1969  . VAGINAL HYSTERECTOMY  1981   without ovaries     Current Outpatient Medications  Medication Sig Dispense Refill  . amphetamine-dextroamphetamine (ADDERALL) 20 MG tablet Take 20 mg by mouth 2 (two) times daily.     Marland Kitchen aspirin EC 81 MG tablet Take 1 tablet (81 mg total) by mouth daily. 90 tablet 3  . atorvastatin (LIPITOR) 10 MG tablet Take 10 mg by mouth daily.    . budesonide-formoterol (SYMBICORT) 160-4.5 MCG/ACT inhaler Inhale 2 puffs into the lungs 2 (two) times daily. 1 Inhaler 11  . Cholecalciferol (VITAMIN D3) 5000 units TABS Take 10,000 Units by mouth daily.     . clonazePAM (KLONOPIN) 0.5 MG tablet Take 0.5 mg by mouth as needed.    . Loratadine (CLARITIN PO) Take 1 tablet by mouth daily as needed.     . Magnesium Oxide (MAG-OXIDE PO) Take 1 tablet by mouth daily.    . metoprolol succinate (TOPROL-XL) 25 MG 24 hr tablet Take 25 mg by mouth daily.    . nitroGLYCERIN (NITROSTAT) 0.4 MG SL tablet Place 1 tablet (0.4 mg total) under the tongue every 5 (five) minutes as needed for chest pain. 25 tablet 3  . potassium chloride SA (K-DUR) 20 MEQ tablet Take 1 tablet (20 mEq total) by mouth daily. 90 tablet 3  . sertraline (ZOLOFT) 100 MG tablet Take 200 mg by mouth daily.    . furosemide (LASIX) 20 MG tablet Take 1 tablet (20 mg total) by mouth as needed for fluid or edema. 90 tablet 3   No current facility-administered medications for this visit.     Allergies:   Latex    Social History:  The patient  reports that she has been smoking cigarettes. She has a 41.00 pack-year smoking history. She has never used smokeless tobacco. She reports current alcohol use. She reports that she does not use drugs.   Family History:  The patient's family history includes  Bladder Cancer in her brother; Cancer in her mother; Coronary artery disease in her brother; Diabetes in her father; Heart disease in her brother and father.    ROS:  Please see the history of present illness. Otherwise, review of systems are positive for none.   All other systems are reviewed and negative.    PHYSICAL EXAM: VS:  BP (!) 110/56   Pulse 78   Ht 5' 4.5" (1.638 m)   Wt 123 lb 6.4 oz (56 kg)   SpO2 97%   BMI 20.85 kg/m  , BMI Body mass index is 20.85 kg/m.   General: Well developed, well nourished, NAD Lungs:Clear to ausculation bilaterally. No wheezes Cardiovascular: RRR with S1 S2. No murmur Extremities: No edema. No clubbing or cyanosis. DP/PT pulses 2+ bilaterally Neuro: Alert and oriented. No focal deficits. No facial asymmetry. MAE  spontaneously. Psych: Responds to questions appropriately with normal affect.    EKG:  EKG is not ordered today.  Recent Labs: 10/27/2018: BUN 24; Creatinine, Ser 1.05; NT-Pro BNP 1,536; Potassium 4.7; Sodium 141    Lipid Panel No results found for: CHOL, TRIG, HDL, CHOLHDL, VLDL, LDLCALC, LDLDIRECT    Wt Readings from Last 3 Encounters:  12/07/18 123 lb 6.4 oz (56 kg)  11/15/18 124 lb (56.2 kg)  11/10/18 124 lb 6.4 oz (56.4 kg)     Other studies Reviewed: Additional studies/ records that were reviewed today include:   Cardiac catheterization 10/18/2015:  Prox LAD to Mid LAD lesion, 20 %stenosed.  No significant CAD Mild calcification of proximal LAD Normal EF 65%  Myoview stress test 08/31/2015:  Nuclear stress EF: 50%.  Upsloping ST segment depression ST segment depression of 1 mm was noted during stress in the V5, V6, III and aVF leads.  This is a low risk study.  The left ventricular ejection fraction is mildly decreased (45-54%).  Findings consistent with ischemia.  Echocardiogram 08/31/2015: - Left ventricle: The cavity size was normal. Wall thickness was normal. Systolic function was normal. The  estimated ejection fraction was in the range of 50% to 55%. Wall motion was normal; there were no regional wall motion abnormalities. Doppler parameters are consistent with abnormal left ventricular relaxation (grade 1 diastolic dysfunction). The E/e&' ratio is between 8-15, suggesting indeterminate LV filling pressure. - Mitral valve: Mildly thickened leaflets with elongated and redundant leaflet tips. There was mild to moderate regurgitation. - Left atrium: The atrium was normal in size. - Atrial septum: Aneurysmal IAS - cannot exclude small PFO. - Inferior vena cava: The vessel was normal in size. The respirophasic diameter changes were in the normal range (>= 50%), consistent with normal central venous pressure.  Impressions:  - LVEF 50-55%, normal wall thickness, normal wall motion, diastolic dysfunction, indeterminate LV filling pressure, mildly thickened leaflets with elongated leaflet tips, mild to moderate mitral regurgitation, normal LA size, aneurysmal IAS - cannot exclude small PFO, normal IVC.  Small area of mild apical ischemia. ECG positive with 1 mm upsloping ST segment depression Surface images with EF 50% mild apical hypokinesis  ASSESSMENT AND PLAN:  1.Dyspnea: -Reports  improvement in her symptoms since our last office visit.  She was seen by pulmonary medicine who started her on Symbicort and plan for PFTs 12/13/2018.  -Doubt cardiac etiology for her symptoms.  Currently being treated for COPD.  We discussed tobacco cessation as she is continues to smoke approximately three fourths a pack of cigarettes per day.  This is currently her biggest issue at the moment. -We will reduce her Lasix to 20 mg as needed for lower extremity swelling -Labs today with close follow-up in 4 to 6 months.  2. HLD: -Last LDL, 89>>> placed on statin per PCP  3. CKD stage II: -Last creatinine, 1.05 on 10/27/2018 -Reports labs by PCP with elevated  creatinine at 1.3. -We will reduce her Lasix 20 mg to as needed for lower extremity swelling as she appears euvolemic on exam -I do not think that she needs a daily diuretic at this time  4. Tobacco abuse: -Continues to smoke 3/4 pk/day  -Cessation strongly encouraged -Underwent carotid Dopplers per PCP which were negative for stenosis -Saw pulmonary, Dr. Melvyn Novas on 11/15/2018 with plans for Symbicort and follow up PFTs to be performed on 12/13/2018>> dyspnea improved -Needs low-dose chest CT without contrast recommended 1 year>>this was never obtained   Current medicines  are reviewed at length with the patient today.  The patient does not have concerns regarding medicines.  The following changes have been made: Change Lasix to 20 mg p.o. as needed for lower extremity swelling  Labs/ tests ordered today include: BMET    Orders Placed This Encounter  Procedures  . Basic metabolic panel    Disposition:   FU with Dr. Johnsie Cancel in 6 months  Signed, Kathyrn Drown, NP  12/07/2018 2:07 PM    [address], [place], [place]  [address] Phone: [phone]; Fax: [phone]

## 2018-12-03 ENCOUNTER — Telehealth: Payer: Self-pay | Admitting: Internal Medicine

## 2018-12-03 ENCOUNTER — Other Ambulatory Visit: Payer: Self-pay | Admitting: Internal Medicine

## 2018-12-03 NOTE — Telephone Encounter (Signed)
Burman Nieves- did you call this pt regarding her pre procedure covid testing by chance? I see she is schedule for a PFT on 12/13/18. I do not see any other reason anyone would have called her. Thanks!

## 2018-12-03 NOTE — Telephone Encounter (Signed)
Called patient and have scheduled her for pre-procedure covid testing. Nothing further is needed at this time.

## 2018-12-07 ENCOUNTER — Other Ambulatory Visit: Payer: Self-pay

## 2018-12-07 ENCOUNTER — Encounter: Payer: Self-pay | Admitting: Cardiology

## 2018-12-07 ENCOUNTER — Ambulatory Visit (INDEPENDENT_AMBULATORY_CARE_PROVIDER_SITE_OTHER): Payer: Medicare Other | Admitting: Cardiology

## 2018-12-07 VITALS — BP 110/56 | HR 78 | Ht 64.5 in | Wt 123.4 lb

## 2018-12-07 DIAGNOSIS — R0602 Shortness of breath: Secondary | ICD-10-CM | POA: Diagnosis not present

## 2018-12-07 DIAGNOSIS — F17210 Nicotine dependence, cigarettes, uncomplicated: Secondary | ICD-10-CM | POA: Diagnosis not present

## 2018-12-07 DIAGNOSIS — I10 Essential (primary) hypertension: Secondary | ICD-10-CM

## 2018-12-07 MED ORDER — FUROSEMIDE 20 MG PO TABS: 20.0000 mg | ORAL_TABLET | ORAL | 3 refills | Status: DC | PRN

## 2018-12-07 NOTE — Patient Instructions (Addendum)
Medication Instructions:  Your physician has recommended you make the following change in your medication:   1. TAKE LASIX 20 MG AS NEEDED FOR LEG SWELLING.  If you need a refill on your cardiac medications before your next appointment, please call your pharmacy.   Lab work: TODAY: BMET If you have labs (blood work) drawn today and your tests are completely normal, you will receive your results only by: Marland Kitchen MyChart Message (if you have MyChart) OR . A paper copy in the mail If you have any lab test that is abnormal or we need to change your treatment, we will call you to review the results.  Testing/Procedures: NONE  Follow-Up: At [facility], you and your health needs are our priority.  As part of our continuing mission to provide you with exceptional heart care, we have created designated Provider Care Teams.  These Care Teams include your primary Cardiologist (physician) and Advanced Practice Providers (APPs -  Physician Assistants and Nurse Practitioners) who all work together to provide you with the care you need, when you need it. You will need a follow up appointment in 4-6  months.  Please call our office 2 months in advance to schedule this appointment.  You may see DR. NISHAN or one of the following Advanced Practice Providers on your designated Care Team:   Truitt Merle, NP Cecilie Kicks, NP . Kathyrn Drown, NP

## 2018-12-08 ENCOUNTER — Telehealth: Payer: Self-pay

## 2018-12-08 LAB — BASIC METABOLIC PANEL
BUN/Creatinine Ratio: 13 (ref 12–28)
BUN: 16 mg/dL (ref 8–27)
CO2: 23 mmol/L (ref 20–29)
Calcium: 9.6 mg/dL (ref 8.7–10.3)
Chloride: 105 mmol/L (ref 96–106)
Creatinine, Ser: 1.25 mg/dL — ABNORMAL HIGH (ref 0.57–1.00)
GFR calc Af Amer: 52 mL/min/{1.73_m2} — ABNORMAL LOW (ref >59)
GFR calc non Af Amer: 45 mL/min/{1.73_m2} — ABNORMAL LOW (ref >59)
Glucose: 80 mg/dL (ref 65–99)
Potassium: 4.7 mmol/L (ref 3.5–5.2)
Sodium: 142 mmol/L (ref 134–144)

## 2018-12-08 NOTE — Telephone Encounter (Signed)
lmtcb regarding lab results. 

## 2018-12-09 ENCOUNTER — Other Ambulatory Visit (HOSPITAL_COMMUNITY)
Admission: RE | Admit: 2018-12-09 | Discharge: 2018-12-09 | Disposition: A | Discharge status: Home / Self Care | Payer: Medicare Other | Source: Ambulatory Visit | Attending: Internal Medicine | Admitting: Internal Medicine

## 2018-12-09 ENCOUNTER — Telehealth: Payer: Self-pay

## 2018-12-09 DIAGNOSIS — Z20828 Contact with and (suspected) exposure to other viral communicable diseases: Secondary | ICD-10-CM | POA: Insufficient documentation

## 2018-12-09 DIAGNOSIS — Z01812 Encounter for preprocedural laboratory examination: Secondary | ICD-10-CM | POA: Diagnosis not present

## 2018-12-09 NOTE — Telephone Encounter (Signed)
Notes recorded by Frederik Schmidt, RN on 12/09/2018 at 11:58 AM EDT  The patient has been notified of the result and verbalized understanding. All questions (if any) were answered.  Frederik Schmidt, RN 12/09/2018 11:58 AM

## 2018-12-09 NOTE — Telephone Encounter (Signed)
Notes recorded by Frederik Schmidt, RN on 12/09/2018 at 9:46 AM EDT  [email or web address] 9/24  ------

## 2018-12-09 NOTE — Telephone Encounter (Signed)
-----   Message from Tommie Raymond, NP sent at 12/08/2018 12:12 PM EDT ----- Please let the patient know that her kidney function is more elevated than previous labs done 10/27/2018.  As we discussed during our appointment, she should only take her Lasix as needed for lower extremity swelling.  She also needs to increase her fluid intake for hydration.

## 2018-12-09 NOTE — Telephone Encounter (Signed)
-----   Message from Maureen Raymond, NP sent at 12/08/2018 12:12 PM EDT ----- Please let the patient know that her kidney function is more elevated than previous labs done 10/27/2018.  As we discussed during our appointment, she should only take her Lasix as needed for lower extremity swelling.  She also needs to increase her fluid intake for hydration.

## 2018-12-09 NOTE — Telephone Encounter (Signed)
Follow Up ° °Patient is returning call. Please give a call back.  °

## 2018-12-10 ENCOUNTER — Other Ambulatory Visit: Payer: Self-pay | Admitting: Internal Medicine

## 2018-12-10 DIAGNOSIS — J449 Chronic obstructive pulmonary disease, unspecified: Secondary | ICD-10-CM

## 2018-12-10 LAB — NOVEL CORONAVIRUS, NAA (HOSP ORDER, SEND-OUT TO REF LAB; TAT 18-24 HRS): SARS-CoV-2, NAA: NOT DETECTED

## 2018-12-13 ENCOUNTER — Ambulatory Visit (INDEPENDENT_AMBULATORY_CARE_PROVIDER_SITE_OTHER): Payer: Medicare Other | Admitting: Pulmonary Disease

## 2018-12-13 ENCOUNTER — Encounter: Payer: Self-pay | Admitting: Pulmonary Disease

## 2018-12-13 ENCOUNTER — Ambulatory Visit (INDEPENDENT_AMBULATORY_CARE_PROVIDER_SITE_OTHER): Payer: Medicare Other | Admitting: Internal Medicine

## 2018-12-13 ENCOUNTER — Ambulatory Visit: Payer: BLUE CROSS/BLUE SHIELD | Admitting: Internal Medicine

## 2018-12-13 ENCOUNTER — Other Ambulatory Visit: Payer: Self-pay

## 2018-12-13 VITALS — BP 124/72 | HR 73 | Ht 64.0 in | Wt 122.0 lb

## 2018-12-13 DIAGNOSIS — Z23 Encounter for immunization: Secondary | ICD-10-CM

## 2018-12-13 DIAGNOSIS — Z0000 Encounter for general adult medical examination without abnormal findings: Secondary | ICD-10-CM | POA: Diagnosis not present

## 2018-12-13 DIAGNOSIS — F17210 Nicotine dependence, cigarettes, uncomplicated: Secondary | ICD-10-CM | POA: Diagnosis not present

## 2018-12-13 DIAGNOSIS — R918 Other nonspecific abnormal finding of lung field: Secondary | ICD-10-CM | POA: Diagnosis not present

## 2018-12-13 DIAGNOSIS — J449 Chronic obstructive pulmonary disease, unspecified: Secondary | ICD-10-CM

## 2018-12-13 LAB — PULMONARY FUNCTION TEST
DL/VA % pred: 81 %
DL/VA: 3.41 ml/min/mmHg/L
DLCO unc % pred: 73 %
DLCO unc: 14.51 ml/min/mmHg
FEF 25-75 Post: 1.19 L/sec
FEF 25-75 Pre: 1.14 L/sec
FEF2575-%Change-Post: 4 %
FEF2575-%Pred-Post: 56 %
FEF2575-%Pred-Pre: 54 %
FEV1-%Change-Post: 3 %
FEV1-%Pred-Post: 77 %
FEV1-%Pred-Pre: 75 %
FEV1-Post: 1.86 L
FEV1-Pre: 1.8 L
FEV1FVC-%Change-Post: 4 %
FEV1FVC-%Pred-Pre: 90 %
FEV6-%Change-Post: -2 %
FEV6-%Pred-Post: 83 %
FEV6-%Pred-Pre: 85 %
FEV6-Post: 2.51 L
FEV6-Pre: 2.57 L
FEV6FVC-%Change-Post: -1 %
FEV6FVC-%Pred-Post: 101 %
FEV6FVC-%Pred-Pre: 103 %
FVC-%Change-Post: 0 %
FVC-%Pred-Post: 82 %
FVC-%Pred-Pre: 82 %
FVC-Post: 2.57 L
FVC-Pre: 2.59 L
Post FEV1/FVC ratio: 72 %
Post FEV6/FVC ratio: 98 %
Pre FEV1/FVC ratio: 69 %
Pre FEV6/FVC Ratio: 99 %
RV % pred: 91 %
RV: 1.92 L
TLC % pred: 88 %
TLC: 4.5 L

## 2018-12-13 MED ORDER — NICOTINE POLACRILEX 4 MG MT GUM: 4.0000 mg | CHEWING_GUM | OROMUCOSAL | 3 refills | Status: DC | PRN

## 2018-12-13 MED ORDER — ALBUTEROL SULFATE HFA 108 (90 BASE) MCG/ACT IN AERS: 2.0000 | INHALATION_SPRAY | Freq: Four times a day (QID) | RESPIRATORY_TRACT | 3 refills | Status: DC | PRN

## 2018-12-13 MED ORDER — NICOTINE 21 MG/24HR TD PT24: 21.0000 mg | MEDICATED_PATCH | Freq: Every day | TRANSDERMAL | 3 refills | Status: DC

## 2018-12-13 NOTE — Patient Instructions (Addendum)
You were seen today by Lauraine Rinne, NP  For:  1. COPD   Continue Symbicort 160 >>> 2 puffs in the morning right when you wake up, rinse out your mouth after use, 12 hours later 2 puffs, rinse after use >>> Take this daily, no matter what >>> This is not a rescue inhaler  >>>Use Spacer   Only use your albuterol as a rescue medication to be used if you can't catch your breath by resting or doing a relaxed purse lip breathing pattern.  - The less you use it, the better it will work when you need it. - Ok to use up to 2 puffs  every 4 hours if you must but call for immediate appointment if use goes up over your usual need - Don't leave home without it !!  (think of it like the spare tire for your car)   Note your daily symptoms > remember "red flags" for COPD:   >>>Increase in cough >>>increase in sputum production >>>increase in shortness of breath or activity  intolerance.   If you notice these symptoms, please call the office to be seen.   High-dose flu vaccine today  Walk today  You need to stop smoking   2. Cigarette smoker  We recommend that you stop smoking.  >>>You need to set a quit date >>>If you have friends or family who smoke, let them know you are trying to quit and not to smoke around you or in your living environment  Smoking Cessation Resources:  1 800 QUIT NOW  >>> Patient to call this resource and utilize it to help support her quit smoking >>> Keep up your hard work with stopping smoking  You can also contact the [facility] >>>For smoking cessation classes call [phone]  We do not recommend using e-cigarettes as a form of stopping smoking  You can sign up for smoking cessation support texts and information:  >>>https://smokefree.gov/smokefreetxt  - nicotine (NICODERM CQ) 21 mg/24hr patch; Place 1 patch (21 mg total) onto the skin daily.  Dispense: 28 patch; Refill: 3 - nicotine polacrilex (NICORETTE) 4 MG gum; Take 1 each (4 mg  total) by mouth as needed for smoking cessation.  Dispense: 100 tablet; Refill: 3  Nicotine patches: >>>Make sure you rotate sites that you do not get skin irritation, Apply 1 patch each morning to a non-hairy skin site  If you are smoking greater than 10 cigarettes/day and weigh over 45 kg start with the nicotine patch of 21 mg a day for 6 weeks, then 14 mg a day for 2 weeks, then finished with 7 mg a day for 2 weeks, then stop   >>>If insomnia occurs you are having trouble sleeping you can take the patch off at night, and place a new one on in the morning >>>If the patch is removed at night and you have morning cravings start short acting nicotine replacement therapy such as gum or lozenges  Nicotine Gum:  >>>Smokers who smoke 25 or more cigarettes a day should use 4 mg dose  Proper chewing of gum is important for optimal results.   >>>Do not chew gum to rapidly.  Once you start chewing eating tasty peppery taste and slide the gum to your cheek.  When the taste disappears to a few more times.  Repeat this for 30 minutes.  Then discard the gum.  >>>Avoid acidic beverages such as coffee, carbonated beverages before and during gum use.  A soft acidic beverages  lower oral pH which cause nicotine to not be absorbed properly >>>If you chew the gum too quickly or vigorously you could have nausea, vomiting, abdominal pain, constipation, hiccups, headache, sore jaw, mouth irritation ulcers   3. Abnormal findings on diagnostic imaging of lung  We will get you reestablish with the lung cancer screening program at our office  4. Healthcare maintenance  High-dose flu vaccine today   We recommend today:  Orders Placed This Encounter  Procedures  . Ambulatory Referral for Lung Cancer Scre    Referral Priority:   Routine    Referral Type:   Consultation    Referral Reason:   Specialty Services Required    Number of Visits Requested:   1   Orders Placed This Encounter  Procedures  .  Ambulatory Referral for Lung Cancer Scre   Meds ordered this encounter  Medications  . nicotine (NICODERM CQ) 21 mg/24hr patch    Sig: Place 1 patch (21 mg total) onto the skin daily.    Dispense:  28 patch    Refill:  3  . nicotine polacrilex (NICORETTE) 4 MG gum    Sig: Take 1 each (4 mg total) by mouth as needed for smoking cessation.    Dispense:  100 tablet    Refill:  3  . albuterol (VENTOLIN HFA) 108 (90 Base) MCG/ACT inhaler    Sig: Inhale 2 puffs into the lungs every 6 (six) hours as needed for wheezing or shortness of breath.    Dispense:  6.7 g    Refill:  3    Follow Up:    Return in about 6 weeks (around 01/24/2019), or if symptoms worsen or fail to improve, for Follow up with Dr. Melvyn Novas.   Please do your part to reduce the spread of COVID-19:      Reduce your risk of any infection  and COVID19 by using the similar precautions used for avoiding the common cold or flu:  Marland Kitchen Wash your hands often with soap and warm water for at least 20 seconds.  If soap and water are not readily available, use an alcohol-based hand sanitizer with at least 60% alcohol.  . If coughing or sneezing, cover your mouth and nose by coughing or sneezing into the elbow areas of your shirt or coat, into a tissue or into your sleeve (not your hands). Langley Gauss A MASK when in public  . Avoid shaking hands with others and consider head nods or verbal greetings only. . Avoid touching your eyes, nose, or mouth with unwashed hands.  . Avoid close contact with people who are sick. . Avoid places or events with large numbers of people in one location, like concerts or sporting events. . If you have some symptoms but not all symptoms, continue to monitor at home and seek medical attention if your symptoms worsen. . If you are having a medical emergency, call 911.   [place] / e-Visit: [email or web address]          [facility]: [facility]: [id number]                   [facility]: [id number]     It is flu season:   >>> Best ways to protect herself from the flu: Receive the yearly flu vaccine, practice good hand hygiene washing with soap and also using hand sanitizer when available, eat a nutritious meals, get adequate  rest, hydrate appropriately   Please contact the office if your symptoms worsen or you have concerns that you are not improving.   Thank you for choosing [facility] for your healthcare, and for allowing [place] to partner with you on your healthcare journey. I am thankful to be able to provide care to you today.   Wyn Quaker FNP-C    Health Risks of Smoking Smoking cigarettes is very bad for your health. Tobacco smoke has over 200 known poisons in it. It contains the poisonous gases nitrogen oxide and carbon monoxide. There are over 60 chemicals in tobacco smoke that cause cancer. Smoking is difficult to quit because a chemical in tobacco, called nicotine, causes addiction or dependence. When you smoke and inhale, nicotine is absorbed rapidly into the bloodstream through your lungs. Both inhaled and non-inhaled nicotine may be addictive. What are the risks of cigarette smoke? Cigarette smokers have an increased risk of many serious medical problems, including:  Lung cancer.  Lung disease, such as pneumonia, bronchitis, and emphysema.  Chest pain (angina) and heart attack because the heart is not getting enough oxygen.  Heart disease and peripheral blood vessel disease.  High blood pressure (hypertension).  Stroke.  Oral cancer, including cancer of the lip, mouth, or voice box.  Bladder cancer.  Pancreatic cancer.  Cervical cancer.  Pregnancy complications, including premature birth.  Stillbirths and smaller newborn babies, birth defects, and genetic damage to sperm.  Early menopause.   Lower estrogen level for women.  Infertility.  Facial wrinkles.  Blindness.  Increased risk of broken bones (fractures).  Senile dementia.  Stomach ulcers and internal bleeding.  Delayed wound healing and increased risk of complications during surgery.  Even smoking lightly shortens your life expectancy by several years. Because of secondhand smoke exposure, children of smokers have an increased risk of the following:  Sudden infant death syndrome (SIDS).  Respiratory infections.  Lung cancer.  Heart disease.  Ear infections. What are the benefits of quitting? There are many health benefits of quitting smoking. Here are some of them:  Within days of quitting smoking, your risk of having a heart attack decreases, your blood flow improves, and your lung capacity improves. Blood pressure, pulse rate, and breathing patterns start returning to normal soon after quitting.  Within months, your lungs may clear up completely.  Quitting for 10 years reduces your risk of developing lung cancer and heart disease to almost that of a nonsmoker.  People who quit may see an improvement in their overall quality of life. How do I quit smoking?     Smoking is an addiction with both physical and psychological effects, and longtime habits can be hard to change. Your health care provider can recommend:  Programs and community resources, which may include group support, education, or talk therapy.  Prescription medicines to help reduce cravings.  Nicotine replacement products, such as patches, gum, and nasal sprays. Use these products only as directed. Do not replace cigarette smoking with electronic cigarettes, which are commonly called e-cigarettes. The safety of e-cigarettes is not known, and some may contain harmful chemicals.  A combination of two or more of these methods. Where to find more information  American Lung Association: www.lung.org  American Cancer Society:  www.cancer.org Summary  Smoking cigarettes is very bad for your health. Cigarette smokers have an increased risk of many serious medical problems, including several cancers, heart disease, and stroke.  Smoking is an addiction with both physical and psychological effects, and longtime  habits can be hard to change.  By stopping right away, you can greatly reduce the risk of medical problems for you and your family.  To help you quit smoking, your health care provider can recommend programs, community resources, prescription medicines, and nicotine replacement products such as patches, gum, and nasal sprays. This information is not intended to replace advice given to you by your health care provider. Make sure you discuss any questions you have with your health care provider. Document Released: 04/10/2004 Document Revised: 06/04/2017 Document Reviewed: 03/07/2016 Elsevier Patient Education  2020 [employer].    Steps to Quit Smoking Smoking tobacco is the leading cause of preventable death. It can affect almost every organ in the body. Smoking puts you and people around you at risk for many serious, long-lasting (chronic) diseases. Quitting smoking can be hard, but it is one of the best things that you can do for your health. It is never too late to quit. How do I get ready to quit? When you decide to quit smoking, make a plan to help you succeed. Before you quit:  Pick a date to quit. Set a date within the next 2 weeks to give you time to prepare.  Write down the reasons why you are quitting. Keep this list in places where you will see it often.  Tell your family, friends, and co-workers that you are quitting. Their support is important.  Talk with your doctor about the choices that may help you quit.  Find out if your health insurance will pay for these treatments.  Know the people, places, things, and activities that make you want to smoke (triggers). Avoid them. What first steps  can I take to quit smoking?  Throw away all cigarettes at home, at work, and in your car.  Throw away the things that you use when you smoke, such as ashtrays and lighters.  Clean your car. Make sure to empty the ashtray.  Clean your home, including curtains and carpets. What can I do to help me quit smoking? Talk with your doctor about taking medicines and seeing a counselor at the same time. You are more likely to succeed when you do both.  If you are pregnant or breastfeeding, talk with your doctor about counseling or other ways to quit smoking. Do not take medicine to help you quit smoking unless your doctor tells you to do so. To quit smoking: Quit right away  Quit smoking totally, instead of slowly cutting back on how much you smoke over a period of time.  Go to counseling. You are more likely to quit if you go to counseling sessions regularly. Take medicine You may take medicines to help you quit. Some medicines need a prescription, and some you can buy over-the-counter. Some medicines may contain a drug called nicotine to replace the nicotine in cigarettes. Medicines may:  Help you to stop having the desire to smoke (cravings).  Help to stop the problems that come when you stop smoking (withdrawal symptoms). Your doctor may ask you to use:  Nicotine patches, gum, or lozenges.  Nicotine inhalers or sprays.  Non-nicotine medicine that is taken by mouth. Find resources Find resources and other ways to help you quit smoking and remain smoke-free after you quit. These resources are most helpful when you use them often. They include:  Online chats with a Social worker.  Phone quitlines.  Printed Furniture conservator/restorer.  Support groups or group counseling.  Text messaging programs.  Mobile phone apps. Use  apps on your mobile phone or tablet that can help you stick to your quit plan. There are many free apps for mobile phones and tablets as well as websites. Examples include  Quit Guide from the [employer] and smokefree.gov  What things can I do to make it easier to quit?   Talk to your family and friends. Ask them to support and encourage you.  Call a phone quitline (1-800-QUIT-NOW), reach out to support groups, or work with a Social worker.  Ask people who smoke to not smoke around you.  Avoid places that make you want to smoke, such as: ? Bars. ? Parties. ? Smoke-break areas at work.  Spend time with people who do not smoke.  Lower the stress in your life. Stress can make you want to smoke. Try these things to help your stress: ? Getting regular exercise. ? Doing deep-breathing exercises. ? Doing yoga. ? Meditating. ? Doing a body scan. To do this, close your eyes, focus on one area of your body at a time from head to toe. Notice which parts of your body are tense. Try to relax the muscles in those areas. How will I feel when I quit smoking? Day 1 to 3 weeks Within the first 24 hours, you may start to have some problems that come from quitting tobacco. These problems are very bad 2-3 days after you quit, but they do not often last for more than 2-3 weeks. You may get these symptoms:  Mood swings.  Feeling restless, nervous, angry, or annoyed.  Trouble concentrating.  Dizziness.  Strong desire for high-sugar foods and nicotine.  Weight gain.  Trouble pooping (constipation).  Feeling like you may vomit (nausea).  Coughing or a sore throat.  Changes in how the medicines that you take for other issues work in your body.  Depression.  Trouble sleeping (insomnia). Week 3 and afterward After the first 2-3 weeks of quitting, you may start to notice more positive results, such as:  Better sense of smell and taste.  Less coughing and sore throat.  Slower heart rate.  Lower blood pressure.  Clearer skin.  Better breathing.  Fewer sick days. Quitting smoking can be hard. Do not give up if you fail the first time. Some people need to try a  few times before they succeed. Do your best to stick to your quit plan, and talk with your doctor if you have any questions or concerns. Summary  Smoking tobacco is the leading cause of preventable death. Quitting smoking can be hard, but it is one of the best things that you can do for your health.  When you decide to quit smoking, make a plan to help you succeed.  Quit smoking right away, not slowly over a period of time.  When you start quitting, seek help from your doctor, family, or friends. This information is not intended to replace advice given to you by your health care provider. Make sure you discuss any questions you have with your health care provider. Document Released: 12/28/2008 Document Revised: 05/21/2018 Document Reviewed: 05/22/2018 Elsevier Patient Education  [place].    Influenza Virus Vaccine injection What is this medicine? INFLUENZA VIRUS VACCINE (in floo EN zuh VAHY ruhs vak SEEN) helps to reduce the risk of getting influenza also known as the flu. The vaccine only helps protect you against some strains of the flu. This medicine may be used for other purposes; ask your health care provider or pharmacist if you have questions. COMMON  BRAND NAME(S): Afluria, Afluria Quadrivalent, Agriflu, Alfuria, FLUAD, Fluarix, Fluarix Quadrivalent, Flublok, Flublok Quadrivalent, FLUCELVAX, Flulaval, Fluvirin, Fluzone, Fluzone High-Dose, Fluzone Intradermal What should I tell my health care provider before I take this medicine? They need to know if you have any of these conditions:  bleeding disorder like hemophilia  fever or infection  Guillain-Barre syndrome or other neurological problems  immune system problems  infection with the human immunodeficiency virus (HIV) or AIDS  low blood platelet counts  multiple sclerosis  an unusual or allergic reaction to influenza virus vaccine, latex, other medicines, foods, dyes, or preservatives. Different brands of  vaccines contain different allergens. Some may contain latex or eggs. Talk to your doctor about your allergies to make sure that you get the right vaccine.  pregnant or trying to get pregnant  breast-feeding How should I use this medicine? This vaccine is for injection into a muscle or under the skin. It is given by a health care professional. A copy of Vaccine Information Statements will be given before each vaccination. Read this sheet carefully each time. The sheet may change frequently. Talk to your healthcare provider to see which vaccines are right for you. Some vaccines should not be used in all age groups. Overdosage: If you think you have taken too much of this medicine contact a poison control center or emergency room at once. NOTE: This medicine is only for you. Do not share this medicine with others. What if I miss a dose? This does not apply. What may interact with this medicine?  chemotherapy or radiation therapy  medicines that lower your immune system like etanercept, anakinra, infliximab, and adalimumab  medicines that treat or prevent blood clots like warfarin  phenytoin  steroid medicines like prednisone or cortisone  theophylline  vaccines This list may not describe all possible interactions. Give your health care provider a list of all the medicines, herbs, non-prescription drugs, or dietary supplements you use. Also tell them if you smoke, drink alcohol, or use illegal drugs. Some items may interact with your medicine. What should I watch for while using this medicine? Report any side effects that do not go away within 3 days to your doctor or health care professional. Call your health care provider if any unusual symptoms occur within 6 weeks of receiving this vaccine. You may still catch the flu, but the illness is not usually as bad. You cannot get the flu from the vaccine. The vaccine will not protect against colds or other illnesses that may cause fever. The  vaccine is needed every year. What side effects may I notice from receiving this medicine? Side effects that you should report to your doctor or health care professional as soon as possible:  allergic reactions like skin rash, itching or hives, swelling of the face, lips, or tongue Side effects that usually do not require medical attention (report to your doctor or health care professional if they continue or are bothersome):  fever  headache  muscle aches and pains  pain, tenderness, redness, or swelling at the injection site  tiredness This list may not describe all possible side effects. Call your doctor for medical advice about side effects. You may report side effects to FDA at 1-800-FDA-1088. Where should I keep my medicine? The vaccine will be given by a health care professional in a clinic, pharmacy, doctor's office, or other health care setting. You will not be given vaccine doses to store at home. NOTE: This sheet is a summary. It may not  cover all possible information. If you have questions about this medicine, talk to your doctor, pharmacist, or health care provider.  2020 Elsevier/Gold Standard (2018-01-26 08:45:43)

## 2018-12-13 NOTE — Progress Notes (Signed)
Chart and office note reviewed in detail  > agree with a/p as outlined    

## 2018-12-13 NOTE — Assessment & Plan Note (Signed)
Plan: Patient needs to reestablish with lung cancer screening program, will route chart to Eric Form NP as well as Doroteo Glassman, RN for follow-up

## 2018-12-13 NOTE — Progress Notes (Signed)
@Patient  ID: Maureen Chung, female    DOB: 1950/06/16, 68 y.o.   MRN: [id number]  Chief Complaint  Patient presents with  . Follow-up    review PFT.  pt c/o stable sob with exertion, prod cough with yellow/gray mucus.     Referring provider: London Pepper, MD  HPI:  68 year old female current every day smoker initially consulted with our practice on 11/15/2018 for increasing cough and shortness of breath since 2017.  PMH: Hyperparathyroidism Smoker/ Smoking History: Current smoker, 1 pack/day.  41-pack-year smoking history. Maintenance: Symbicort 160 Pt of: Dr. Melvyn Novas  12/13/2018  - Visit   68 year old female current every day smoker initially consulted with our practice on 11/15/2018 for increasing shortness of breath and cough.  Patient presenting today to complete a pulmonary function test.  Pulmonary function test results are listed below:  12/13/2018-pulmonary function test-FVC 2.59 (82% predicted), postbronchodilator ratio 72, postbronchodilator FEV1 1.86 (77% predicted), no bronchodilator response, DLCO 14.51 (73% predicted), Concavity and flow volume loops  Patient reporting today that she feels that she is at her baseline.  She continues to smoke 1 pack/day.  She does have a productive cough with gray mucus.  Overall patient feels asymptomatic at this time.  She does feel that the Symbicort 160 inhaler has helped.  She is willing to try to stop smoking.  She is attempted to try to stop smoking one time in the past it lasted 2 days.  She is interested in getting the flu vaccine today if we have any available. Patient does not have a rescue inhaler at home.   Questionaires / Pulmonary Flowsheets:   MMRC: mMRC Dyspnea Scale mMRC Score  12/13/2018 1    Tests:   12/09/2018-SARS-CoV-2-negative  10/27/2018-proBNP-1536  11/16/2018-chest x-ray-no active cardiopulmonary disease  10/09/2015-CBC with differential- eosinophils absolute 495  01/13/2017-CT chest lung cancer  screening-lung RADS 2S, minimal centrilobular and paraseptal emphysema, new subpleural pulmonary nodule in peripheral left lower lobe measuring 3.8 mm in volume benign appearance of behavior, follow-up CT in 12 months  11/02/2018- echocardiogram-LV ejection fraction greater than 65%, right ventricle normal systolic function  FENO:  No results found for: NITRICOXIDE  PFT: PFT Results Latest Ref Rng & Units 12/13/2018  FVC-Pre L 2.59  FVC-Predicted Pre % 82  FVC-Post L 2.57  FVC-Predicted Post % 82  Pre FEV1/FVC % % 69  Post FEV1/FCV % % 72  FEV1-Pre L 1.80  FEV1-Predicted Pre % 75  FEV1-Post L 1.86  DLCO UNC% % 73  DLCO COR %Predicted % 81  TLC L 4.50  TLC % Predicted % 88  RV % Predicted % 91    WALK:  SIX MIN WALK 12/13/2018  Supplimental Oxygen during Test? (L/min) No  Tech Comments: pt tolerated walk well.    Imaging: Dg Chest 2 View  Result Date: 11/16/2018 CLINICAL DATA:  Cough, COPD EXAM: CHEST - 2 VIEW COMPARISON:  06/26/2014, 01/13/2017 FINDINGS: The heart size and mediastinal contours are within normal limits. Calcific aortic knob. Both lungs are clear. The visualized skeletal structures are within normal limits. IMPRESSION: No active cardiopulmonary disease. Electronically Signed   By: Davina Poke M.D.   On: 11/16/2018 08:34    Lab Results:  CBC    Component Value Date/Time   WBC 9.9 10/09/2015 1017   RBC 3.89 10/09/2015 1017   HGB 12.2 10/09/2015 1017   HCT 37.4 10/09/2015 1017   PLT 248 10/09/2015 1017   MCV 96.1 10/09/2015 1017   MCH 31.4 10/09/2015 1017  MCHC 32.6 10/09/2015 1017   RDW 15.3 (H) 10/09/2015 1017   LYMPHSABS 2,574 10/09/2015 1017   MONOABS 1,188 (H) 10/09/2015 1017   EOSABS 495 10/09/2015 1017   BASOSABS 99 10/09/2015 1017    BMET    Component Value Date/Time   NA 142 12/07/2018 1409   K 4.7 12/07/2018 1409   CL 105 12/07/2018 1409   CO2 23 12/07/2018 1409   GLUCOSE 80 12/07/2018 1409   GLUCOSE 93 10/18/2015 0945   BUN 16  12/07/2018 1409   CREATININE 1.25 (H) 12/07/2018 1409   CALCIUM 9.6 12/07/2018 1409   CALCIUM 10.0 01/03/2011 1504   GFRNONAA 45 (L) 12/07/2018 1409   GFRAA 52 (L) 12/07/2018 1409    BNP    Component Value Date/Time   BNP 31.4 10/09/2015 1017    ProBNP    Component Value Date/Time   PROBNP 1,536 (H) 10/27/2018 1416    Specialty Problems      Pulmonary Problems   COPD GOLD 0 still smoking    Active smoker - .11/15/2018  After extensive coaching inhaler device,  effectiveness =    75% try symbicort 160 2bid   10/09/2015-CBC with differential- eosinophils absolute 495  12/13/2018-pulmonary function test-FVC 2.59 (82% predicted), postbronchodilator ratio 72, postbronchodilator FEV1 1.86 (77% predicted), no bronchodilator response, DLCO 14.51 (73% predicted), Concavity and flow volume loops          Allergies  Allergen Reactions  . Latex Rash    Immunization History  Administered Date(s) Administered  . Influenza-Unspecified 12/15/2017  . Pneumococcal Conjugate-13 12/15/2017  . Zoster Recombinat (Shingrix) 04/13/2018, 11/04/2018    Past Medical History:  Diagnosis Date  . Anxiety   . Arthritis   . Hypercalcemia   . Hyperlipidemia   . Hyperparathyroidism   . Insomnia   . Osteoporosis   . Pericarditis     Tobacco History: Social History   Tobacco Use  Smoking Status Current Every Day Smoker  . Packs/day: 1.00  . Years: 41.00  . Pack years: 41.00  . Types: Cigarettes  Smokeless Tobacco Never Used  Tobacco Comment   Encouraged to develop a plan or come to me for assistance when ready to set a date.   Ready to quit: Yes Counseling given: Yes Comment: Encouraged to develop a plan or come to me for assistance when ready to set a date.  Smoking assessment and cessation counseling  Patient currently smoking: 1 pack/day I have advised the patient to quit/stop smoking as soon as possible due to high risk for multiple medical problems.  It will also be very  difficult for [place] to manage patient's  respiratory symptoms and status if we continue to expose her lungs to a known irritant.  We do not advise e-cigarettes as a form of stopping smoking.  Patient is  willing to quit smoking.  Has not set a quit date yet  I have advised the patient that we can assist and have options of nicotine replacement therapy, provided smoking cessation education today, provided smoking cessation counseling, and provided cessation resources.  Follow-up next office visit office visit for assessment of smoking cessation.  Smoking cessation counseling advised for: 11 min     Outpatient Encounter Medications as of 12/13/2018  Medication Sig  . amphetamine-dextroamphetamine (ADDERALL) 20 MG tablet Take 20 mg by mouth 2 (two) times daily.   Marland Kitchen aspirin EC 81 MG tablet Take 1 tablet (81 mg total) by mouth daily.  Marland Kitchen atorvastatin (LIPITOR) 10 MG tablet Take 10 mg  by mouth daily.  . budesonide-formoterol (SYMBICORT) 160-4.5 MCG/ACT inhaler Inhale 2 puffs into the lungs 2 (two) times daily.  . Cholecalciferol (VITAMIN D3) 5000 units TABS Take 10,000 Units by mouth daily.   . clonazePAM (KLONOPIN) 0.5 MG tablet Take 0.5 mg by mouth as needed.  . furosemide (LASIX) 20 MG tablet Take 1 tablet (20 mg total) by mouth as needed for fluid or edema.  . Loratadine (CLARITIN PO) Take 1 tablet by mouth daily as needed.   . Magnesium Oxide (MAG-OXIDE PO) Take 1 tablet by mouth daily.  . metoprolol succinate (TOPROL-XL) 25 MG 24 hr tablet Take 25 mg by mouth daily.  . nitroGLYCERIN (NITROSTAT) 0.4 MG SL tablet Place 1 tablet (0.4 mg total) under the tongue every 5 (five) minutes as needed for chest pain.  . potassium chloride SA (K-DUR) 20 MEQ tablet Take 1 tablet (20 mEq total) by mouth daily.  . sertraline (ZOLOFT) 100 MG tablet Take 200 mg by mouth daily.  Marland Kitchen albuterol (VENTOLIN HFA) 108 (90 Base) MCG/ACT inhaler Inhale 2 puffs into the lungs every 6 (six) hours as needed for wheezing or  shortness of breath.  . nicotine (NICODERM CQ) 21 mg/24hr patch Place 1 patch (21 mg total) onto the skin daily.  . nicotine polacrilex (NICORETTE) 4 MG gum Take 1 each (4 mg total) by mouth as needed for smoking cessation.   No facility-administered encounter medications on file as of 12/13/2018.      Review of Systems  Review of Systems  Constitutional: Negative for activity change, fatigue and fever.  HENT: Negative for sinus pressure, sinus pain and sore throat.   Respiratory: Positive for cough. Negative for shortness of breath and wheezing.   Cardiovascular: Negative for chest pain and palpitations.  Gastrointestinal: Negative for diarrhea, nausea and vomiting.  Musculoskeletal: Negative for arthralgias.  Neurological: Negative for dizziness.  Psychiatric/Behavioral: Negative for sleep disturbance. The patient is not nervous/anxious.      Physical Exam  BP 124/72 (BP Location: Right Arm, Cuff Size: Normal)   Pulse 73   Ht 5\' 4"  (1.626 m)   Wt 122 lb (55.3 kg)   SpO2 99%   BMI 20.94 kg/m   Wt Readings from Last 5 Encounters:  12/13/18 122 lb (55.3 kg)  12/07/18 123 lb 6.4 oz (56 kg)  11/15/18 124 lb (56.2 kg)  11/10/18 124 lb 6.4 oz (56.4 kg)  10/27/18 130 lb (59 kg)    BMI Readings from Last 5 Encounters:  12/13/18 20.94 kg/m  12/07/18 20.85 kg/m  11/15/18 20.96 kg/m  11/10/18 21.69 kg/m  10/27/18 22.31 kg/m     Physical Exam Vitals signs and nursing note reviewed.  Constitutional:      General: She is not in acute distress.    Appearance: Normal appearance. She is normal weight.  HENT:     Head: Normocephalic and atraumatic.     Right Ear: Tympanic membrane, ear canal and external ear normal. There is no impacted cerumen.     Left Ear: Tympanic membrane, ear canal and external ear normal. There is no impacted cerumen.     Nose: Nose normal. No congestion or rhinorrhea.     Mouth/Throat:     Mouth: Mucous membranes are moist.     Pharynx:  Oropharynx is clear.  Eyes:     Pupils: Pupils are equal, round, and reactive to light.  Neck:     Musculoskeletal: Normal range of motion.  Cardiovascular:     Rate and Rhythm: Normal  rate and regular rhythm.     Pulses: Normal pulses.     Heart sounds: Normal heart sounds. No murmur.  Pulmonary:     Effort: Pulmonary effort is normal. No respiratory distress.     Breath sounds: Normal breath sounds. No decreased air movement. No decreased breath sounds, wheezing or rales.  Skin:    General: Skin is warm and dry.     Capillary Refill: Capillary refill takes less than 2 seconds.  Neurological:     General: No focal deficit present.     Mental Status: She is alert and oriented to person, place, and time. Mental status is at baseline.     Gait: Gait (Tolerated walk in office today -2 laps) normal.  Psychiatric:        Mood and Affect: Mood normal.        Behavior: Behavior normal.        Thought Content: Thought content normal.        Judgment: Judgment normal.       Assessment & Plan:   COPD GOLD 0 still smoking Reviewed pulmonary function test with patient today We will go ahead and proceed forward with COPD diagnosis due to the concavity of flow volume loops, active smoker, emphysema on 2018 CT  Plan: Continue Symbicort 160 Prescription sent for rescue inhaler Walk today in office patient completed 2 laps with no oxygen desaturations Patient needs to stop smoking, will bring patient back in 4 to 6 weeks for smoking cessation management  We will get patient back established with lung cancer screening program as she is due for a lung cancer screening CT  Abnormal findings on diagnostic imaging of lung Plan: Patient needs to reestablish with lung cancer screening program, will route chart to Eric Form NP as well as Doroteo Glassman, RN for follow-up  Healthcare maintenance Plan: High-dose flu vaccine today Patient needs to stop smoking   Cigarette smoker Plan:  Referral back to lung cancer screening program today Emphasized the need for the patient to stop smoking Prescriptions for 4 mg nicotine gum as well as 21 mg nicotine patch for patient to start using  If patient struggles with smoking cessation could consider referral to pharmacy team for an office smoking cessation visit    Return in about 6 weeks (around 01/24/2019), or if symptoms worsen or fail to improve, for Follow up with Dr. Melvyn Novas.   Lauraine Rinne, NP 12/13/2018   This appointment was 28 minutes long with over 50% of the time in direct face-to-face patient care, assessment, plan of care, and follow-up.

## 2018-12-13 NOTE — Assessment & Plan Note (Addendum)
Plan: Referral back to lung cancer screening program today Emphasized the need for the patient to stop smoking Prescriptions for 4 mg nicotine gum as well as 21 mg nicotine patch for patient to start using  If patient struggles with smoking cessation could consider referral to pharmacy team for an office smoking cessation visit

## 2018-12-13 NOTE — Assessment & Plan Note (Addendum)
Reviewed pulmonary function test with patient today We will go ahead and proceed forward with COPD diagnosis due to the concavity of flow volume loops, active smoker, emphysema on 2018 CT Peripheral eosinophilia in 2017 blood work so we will leave on ICS laba at this time  Plan: Continue Symbicort 160 Prescription sent for rescue inhaler Walk today in office patient completed 2 laps with no oxygen desaturations Patient needs to stop smoking, will bring patient back in 4 to 6 weeks for smoking cessation management  We will get patient back established with lung cancer screening program as she is due for a lung cancer screening CT

## 2018-12-13 NOTE — Progress Notes (Signed)
PFT done today. 

## 2018-12-13 NOTE — Assessment & Plan Note (Signed)
Plan: High-dose flu vaccine today Patient needs to stop smoking

## 2018-12-14 ENCOUNTER — Other Ambulatory Visit: Payer: Self-pay | Admitting: *Deleted

## 2018-12-14 DIAGNOSIS — Z87891 Personal history of nicotine dependence: Secondary | ICD-10-CM

## 2018-12-14 DIAGNOSIS — F17210 Nicotine dependence, cigarettes, uncomplicated: Secondary | ICD-10-CM

## 2018-12-14 DIAGNOSIS — Z122 Encounter for screening for malignant neoplasm of respiratory organs: Secondary | ICD-10-CM

## 2019-01-03 DIAGNOSIS — F909 Attention-deficit hyperactivity disorder, unspecified type: Secondary | ICD-10-CM | POA: Diagnosis not present

## 2019-01-03 DIAGNOSIS — F339 Major depressive disorder, recurrent, unspecified: Secondary | ICD-10-CM | POA: Diagnosis not present

## 2019-01-03 DIAGNOSIS — I10 Essential (primary) hypertension: Secondary | ICD-10-CM | POA: Diagnosis not present

## 2019-01-03 DIAGNOSIS — Z720 Tobacco use: Secondary | ICD-10-CM | POA: Diagnosis not present

## 2019-01-03 DIAGNOSIS — F411 Generalized anxiety disorder: Secondary | ICD-10-CM | POA: Diagnosis not present

## 2019-01-03 DIAGNOSIS — J439 Emphysema, unspecified: Secondary | ICD-10-CM | POA: Diagnosis not present

## 2019-01-03 DIAGNOSIS — N289 Disorder of kidney and ureter, unspecified: Secondary | ICD-10-CM | POA: Diagnosis not present

## 2019-01-10 ENCOUNTER — Other Ambulatory Visit: Payer: Self-pay | Admitting: *Deleted

## 2019-01-10 DIAGNOSIS — F17210 Nicotine dependence, cigarettes, uncomplicated: Secondary | ICD-10-CM

## 2019-01-10 DIAGNOSIS — Z122 Encounter for screening for malignant neoplasm of respiratory organs: Secondary | ICD-10-CM

## 2019-01-10 DIAGNOSIS — Z87891 Personal history of nicotine dependence: Secondary | ICD-10-CM

## 2019-01-14 ENCOUNTER — Ambulatory Visit
Admission: RE | Admit: 2019-01-14 | Discharge: 2019-01-14 | Disposition: A | Discharge status: Home / Self Care | Payer: Medicare Other | Source: Ambulatory Visit | Attending: Acute Care | Admitting: Acute Care

## 2019-01-14 ENCOUNTER — Other Ambulatory Visit: Payer: Self-pay

## 2019-01-14 DIAGNOSIS — Z122 Encounter for screening for malignant neoplasm of respiratory organs: Secondary | ICD-10-CM

## 2019-01-14 DIAGNOSIS — Z87891 Personal history of nicotine dependence: Secondary | ICD-10-CM

## 2019-01-14 DIAGNOSIS — F17210 Nicotine dependence, cigarettes, uncomplicated: Secondary | ICD-10-CM

## 2019-01-14 DIAGNOSIS — Z136 Encounter for screening for cardiovascular disorders: Secondary | ICD-10-CM | POA: Diagnosis not present

## 2019-01-23 NOTE — Progress Notes (Signed)
@Patient  ID: Maureen Chung, female    DOB: 12-10-1950, 68 y.o.   MRN: [id number]  Chief Complaint  Patient presents with  . Follow-up    6-week follow-up, still smoking    Referring provider: London Pepper, MD  HPI:  68 year old female current every day smoker initially consulted with our practice on 11/15/2018 for increasing cough and shortness of breath since 2017.  PMH: Hyperparathyroidism Smoker/ Smoking History: Current smoker, 1 pack/day.  41-pack-year smoking history. Maintenance: Symbicort 160 Pt of: Dr. Melvyn Novas  01/24/2019  - Visit   68 year old female current everyday smoker.  Patient is smoking 1 pack/day.  She is presenting to our office as a 6-week follow-up.  Since last being seen she is attempted to try to stop smoking but she has been struggling with this.  She is attempted nicotine replacement therapies but not completely.  Patient has increased anxiety due to the COVID-19 pandemic as well as the current 2020 election.  Patient is interested in stopping smoking.  Patient has no acute or worsening complaints today.  She reports adherence to her Symbicort 160.  Patient did complete recent lung cancer screening CT which was a lung RADS 2.  Did show pulmonary nodules largest being 5.4 mm.  Questionaires / Pulmonary Flowsheets:   MMRC: mMRC Dyspnea Scale mMRC Score  01/24/2019 1  12/13/2018 1    Tests:   12/09/2018-SARS-CoV-2-negative  10/27/2018-proBNP-1536  11/16/2018-chest x-ray-no active cardiopulmonary disease  10/09/2015-CBC with differential- eosinophils absolute 495  01/13/2017-CT chest lung cancer screening-lung RADS 2S, minimal centrilobular and paraseptal emphysema, new subpleural pulmonary nodule in peripheral left lower lobe measuring 3.8 mm in volume benign appearance of behavior, follow-up CT in 12 months  11/02/2018- echocardiogram-LV ejection fraction greater than 65%, right ventricle normal systolic function  [id number] chest lung cancer  screening-lung RADS 2, emphysema, lateral right lower lobe pulmonary nodule 5.3 mm, new nodules identified within the right lower lobe which is 3.5 mm  FENO:  No results found for: NITRICOXIDE  PFT: PFT Results Latest Ref Rng & Units 12/13/2018  FVC-Pre L 2.59  FVC-Predicted Pre % 82  FVC-Post L 2.57  FVC-Predicted Post % 82  Pre FEV1/FVC % % 69  Post FEV1/FCV % % 72  FEV1-Pre L 1.80  FEV1-Predicted Pre % 75  FEV1-Post L 1.86  DLCO UNC% % 73  DLCO COR %Predicted % 81  TLC L 4.50  TLC % Predicted % 88  RV % Predicted % 91    WALK:  SIX MIN WALK 12/13/2018  Supplimental Oxygen during Test? (L/min) No  Tech Comments: pt tolerated walk well.    Imaging: Ct Chest Lung Ca Screen Low Dose W/o Cm  Result Date: 01/17/2019 CLINICAL DATA:  Lung cancer screening. Thirty-five pack-year history. Current asymptomatic smoker. EXAM: CT CHEST WITHOUT CONTRAST LOW-DOSE FOR LUNG CANCER SCREENING TECHNIQUE: Multidetector CT imaging of the chest was performed following the standard protocol without IV contrast. COMPARISON:  01/13/2017 FINDINGS: Cardiovascular: Normal heart size. No pericardial effusion identified. Aortic atherosclerosis. Calcification within the LAD coronary artery. Mediastinum/Nodes: Normal appearance of the thyroid gland. The trachea appears patent and is midline. Normal appearance of the esophagus. No enlarged mediastinal or hilar lymph nodes. Lungs/Pleura: No pleural effusion identified. Dependent changes identified within the lung bases. Emphysema. Multiple small scattered pulmonary nodules are identified throughout both lungs. The largest nodule is in the subpleural aspect of the lateral right lower lobe with an equivalent diameter of 5.3 mm. A new nodule is identified within the right lower lobe with  an equivalent diameter of 3.5 mm. Upper Abdomen: No acute abnormality. Musculoskeletal: No chest wall mass or suspicious bone lesions identified. IMPRESSION: 1. Lung-RADS 2, benign  appearance or behavior. Continue annual screening with low-dose chest CT without contrast in 12 months. 2. Aortic Atherosclerosis (ICD10-I70.0) and Emphysema (ICD10-J43.9). 3. Coronary artery calcifications. Electronically Signed   By: Kerby Moors M.D.   On: 01/17/2019 08:30    Lab Results:  CBC    Component Value Date/Time   WBC 9.9 10/09/2015 1017   RBC 3.89 10/09/2015 1017   HGB 12.2 10/09/2015 1017   HCT 37.4 10/09/2015 1017   PLT 248 10/09/2015 1017   MCV 96.1 10/09/2015 1017   MCH 31.4 10/09/2015 1017   MCHC 32.6 10/09/2015 1017   RDW 15.3 (H) 10/09/2015 1017   LYMPHSABS 2,574 10/09/2015 1017   MONOABS 1,188 (H) 10/09/2015 1017   EOSABS 495 10/09/2015 1017   BASOSABS 99 10/09/2015 1017    BMET    Component Value Date/Time   NA 142 12/07/2018 1409   K 4.7 12/07/2018 1409   CL 105 12/07/2018 1409   CO2 23 12/07/2018 1409   GLUCOSE 80 12/07/2018 1409   GLUCOSE 93 10/18/2015 0945   BUN 16 12/07/2018 1409   CREATININE 1.25 (H) 12/07/2018 1409   CALCIUM 9.6 12/07/2018 1409   CALCIUM 10.0 01/03/2011 1504   GFRNONAA 45 (L) 12/07/2018 1409   GFRAA 52 (L) 12/07/2018 1409    BNP    Component Value Date/Time   BNP 31.4 10/09/2015 1017    ProBNP    Component Value Date/Time   PROBNP 1,536 (H) 10/27/2018 1416    Specialty Problems      Pulmonary Problems   COPD GOLD 0 still smoking    Active smoker - .11/15/2018  After extensive coaching inhaler device,  effectiveness =    75% try symbicort 160 2bid  10/09/2015-CBC with differential- eosinophils absolute 495 12/13/2018-pulmonary function test-FVC 2.59 (82% predicted), postbronchodilator ratio 72, postbronchodilator FEV1 1.86 (77% predicted), no bronchodilator response, DLCO 14.51 (73% predicted), Concavity on flow volume loops> continue symbicort           Allergies  Allergen Reactions  . Latex Rash    Immunization History  Administered Date(s) Administered  . Fluad Quad(high Dose 65+) 12/13/2018  .  Influenza-Unspecified 12/15/2017  . Pneumococcal Conjugate-13 12/15/2017  . Zoster Recombinat (Shingrix) 04/13/2018, 11/04/2018    Past Medical History:  Diagnosis Date  . Anxiety   . Arthritis   . Hypercalcemia   . Hyperlipidemia   . Hyperparathyroidism   . Insomnia   . Osteoporosis   . Pericarditis     Tobacco History: Social History   Tobacco Use  Smoking Status Current Every Day Smoker  . Packs/day: 1.00  . Years: 41.00  . Pack years: 41.00  . Types: Cigarettes  Smokeless Tobacco Never Used  Tobacco Comment   Encouraged to develop a plan or come to me for assistance when ready to set a date.   Ready to quit: Yes Counseling given: Yes Comment: Encouraged to develop a plan or come to me for assistance when ready to set a date.  Smoking assessment and cessation counseling  Patient currently smoking: 1 ppd I have advised the patient to quit/stop smoking as soon as possible due to high risk for multiple medical problems.  It will also be very difficult for [place] to manage patient's  respiratory symptoms and status if we continue to expose her lungs to a known irritant.  We do  not advise e-cigarettes as a form of stopping smoking.  Patient is willing to quit smoking.  I have advised the patient that we can assist and have options of nicotine replacement therapy, provided smoking cessation education today, provided smoking cessation counseling, and provided cessation resources.  Follow-up next office visit office visit for assessment of smoking cessation.  Patient is willing to meet with the clinical pharmacy team to review smoking cessation.  Smoking cessation counseling advised for: 5 min    Outpatient Encounter Medications as of 01/24/2019  Medication Sig  . albuterol (VENTOLIN HFA) 108 (90 Base) MCG/ACT inhaler Inhale 2 puffs into the lungs every 6 (six) hours as needed for wheezing or shortness of breath.  . amphetamine-dextroamphetamine (ADDERALL) 20 MG tablet  Take 20 mg by mouth 2 (two) times daily.   Marland Kitchen aspirin EC 81 MG tablet Take 1 tablet (81 mg total) by mouth daily.  Marland Kitchen atorvastatin (LIPITOR) 10 MG tablet Take 10 mg by mouth daily.  . budesonide-formoterol (SYMBICORT) 160-4.5 MCG/ACT inhaler Inhale 2 puffs into the lungs 2 (two) times daily.  . Cholecalciferol (VITAMIN D3) 5000 units TABS Take 10,000 Units by mouth daily.   . clonazePAM (KLONOPIN) 0.5 MG tablet Take 0.5 mg by mouth as needed.  . furosemide (LASIX) 20 MG tablet Take 1 tablet (20 mg total) by mouth as needed for fluid or edema.  . Loratadine (CLARITIN PO) Take 1 tablet by mouth daily as needed.   . Magnesium Oxide (MAG-OXIDE PO) Take 1 tablet by mouth daily.  . metoprolol succinate (TOPROL-XL) 25 MG 24 hr tablet Take 25 mg by mouth daily.  . nitroGLYCERIN (NITROSTAT) 0.4 MG SL tablet Place 1 tablet (0.4 mg total) under the tongue every 5 (five) minutes as needed for chest pain.  . potassium chloride SA (K-DUR) 20 MEQ tablet Take 1 tablet (20 mEq total) by mouth daily.  . sertraline (ZOLOFT) 100 MG tablet Take 200 mg by mouth daily.  . [DISCONTINUED] nicotine (NICODERM CQ) 21 mg/24hr patch Place 1 patch (21 mg total) onto the skin daily.  . [DISCONTINUED] nicotine polacrilex (NICORETTE) 4 MG gum Take 1 each (4 mg total) by mouth as needed for smoking cessation.   No facility-administered encounter medications on file as of 01/24/2019.      Review of Systems  Review of Systems  Constitutional: Negative for activity change, fatigue and fever.  HENT: Negative for sinus pressure, sinus pain and sore throat.   Respiratory: Positive for cough. Negative for shortness of breath and wheezing.   Cardiovascular: Negative for chest pain and palpitations.  Gastrointestinal: Negative for diarrhea, nausea and vomiting.  Musculoskeletal: Negative for arthralgias.  Neurological: Negative for dizziness.  Psychiatric/Behavioral: Negative for sleep disturbance. The patient is not  nervous/anxious.      Physical Exam  BP 126/80 (BP Location: Left Arm, Patient Position: Sitting, Cuff Size: Normal)   Pulse 74   Temp (!) 97.4 F (36.3 C) (Temporal)   Ht 5\' 4"  (1.626 m)   Wt 123 lb (55.8 kg)   SpO2 97%   BMI 21.11 kg/m   Wt Readings from Last 5 Encounters:  01/24/19 123 lb (55.8 kg)  12/13/18 122 lb (55.3 kg)  12/07/18 123 lb 6.4 oz (56 kg)  11/15/18 124 lb (56.2 kg)  11/10/18 124 lb 6.4 oz (56.4 kg)    BMI Readings from Last 5 Encounters:  01/24/19 21.11 kg/m  12/13/18 20.94 kg/m  12/07/18 20.85 kg/m  11/15/18 20.96 kg/m  11/10/18 21.69 kg/m  Physical Exam Vitals signs and nursing note reviewed.  Constitutional:      General: She is not in acute distress.    Appearance: Normal appearance. She is normal weight.  HENT:     Head: Normocephalic and atraumatic.     Right Ear: External ear normal.     Left Ear: External ear normal.  Eyes:     Pupils: Pupils are equal, round, and reactive to light.  Neck:     Musculoskeletal: Normal range of motion.  Cardiovascular:     Rate and Rhythm: Normal rate and regular rhythm.     Pulses: Normal pulses.     Heart sounds: Normal heart sounds. No murmur.  Pulmonary:     Effort: Pulmonary effort is normal. No respiratory distress.     Breath sounds: No decreased air movement. No decreased breath sounds, wheezing or rales.  Skin:    General: Skin is warm and dry.     Capillary Refill: Capillary refill takes less than 2 seconds.  Neurological:     General: No focal deficit present.     Mental Status: She is alert and oriented to person, place, and time. Mental status is at baseline.     Gait: Gait normal.  Psychiatric:        Mood and Affect: Mood is anxious.        Behavior: Behavior normal.        Thought Content: Thought content normal.        Judgment: Judgment normal.       Assessment & Plan:   COPD GOLD 0 still smoking Plan: Continue Symbicort 160 Continue rescue inhaler as needed  Emphasized need for the patient to stop smoking Patient to follow-up with our office in 2 weeks for smoking cessation with clinical pharmacy team Follow-up with Dr. Melvyn Novas in 4 weeks  Abnormal findings on diagnostic imaging of lung Plan: Repeat lung cancer screening CT as ordered in November/2021  Cigarette smoker Currently smoking 1 pack/day Known triggers for stress due to COVID-19 pandemic and 2020 election Recently completed a 2020 CT lung cancer screening which was a lung RADS 2  Plan: Schedule 2 week follow-up with clinical pharmacy team for smoking cessation Follow-up in office in 4 weeks with Dr. Melvyn Novas Complete lung cancer screening CT as ordered November/2021    Return in about 4 weeks (around 02/21/2019), or if symptoms worsen or fail to improve, for Follow up with Dr. Melvyn Novas.   Lauraine Rinne, NP 01/24/2019   This appointment was 26 minutes long with over 50% of the time in direct face-to-face patient care, assessment, plan of care, and follow-up.

## 2019-01-24 ENCOUNTER — Other Ambulatory Visit: Payer: Self-pay

## 2019-01-24 ENCOUNTER — Ambulatory Visit (INDEPENDENT_AMBULATORY_CARE_PROVIDER_SITE_OTHER): Payer: Medicare Other | Admitting: Pulmonary Disease

## 2019-01-24 ENCOUNTER — Encounter: Payer: Self-pay | Admitting: Pulmonary Disease

## 2019-01-24 VITALS — BP 126/80 | HR 74 | Temp 97.4°F | Ht 64.0 in | Wt 123.0 lb

## 2019-01-24 DIAGNOSIS — R918 Other nonspecific abnormal finding of lung field: Secondary | ICD-10-CM

## 2019-01-24 DIAGNOSIS — F17210 Nicotine dependence, cigarettes, uncomplicated: Secondary | ICD-10-CM

## 2019-01-24 DIAGNOSIS — J449 Chronic obstructive pulmonary disease, unspecified: Secondary | ICD-10-CM

## 2019-01-24 NOTE — Patient Instructions (Addendum)
You were seen today by Lauraine Rinne, NP  For:  1. COPD   Continue Symbicort 160 >>> 2 puffs in the morning right when you wake up, rinse out your mouth after use, 12 hours later 2 puffs, rinse after use >>> Take this daily, no matter what >>> This is not a rescue inhaler  >>>Use Spacer   Only use your albuterol as a rescue medication to be used if you can't catch your breath by resting or doing a relaxed purse lip breathing pattern.  - The less you use it, the better it will work when you need it. - Ok to use up to 2 puffs  every 4 hours if you must but call for immediate appointment if use goes up over your usual need - Don't leave home without it !!  (think of it like the spare tire for your car)   Note your daily symptoms > remember "red flags" for COPD:   >>>Increase in cough >>>increase in sputum production >>>increase in shortness of breath or activity  intolerance.   If you notice these symptoms, please call the office to be seen.    You need to stop smoking   2. Cigarette smoker  Please schedule an appointment with our clinical pharmacy team sometime over the next 2 weeks to work on smoking cessation  We recommend that you stop smoking.  >>>You need to set a quit date >>>If you have friends or family who smoke, let them know you are trying to quit and not to smoke around you or in your living environment  Smoking Cessation Resources:  1 800 QUIT NOW  >>> Patient to call this resource and utilize it to help support her quit smoking >>> Keep up your hard work with stopping smoking  You can also contact the [facility] >>>For smoking cessation classes call [phone]  We do not recommend using e-cigarettes as a form of stopping smoking  You can sign up for smoking cessation support texts and information:  >>>https://smokefree.gov/smokefreetxt  - nicotine (NICODERM CQ) 21 mg/24hr patch; Place 1 patch (21 mg total) onto the skin daily.  Dispense: 28  patch; Refill: 3 - nicotine polacrilex (NICORETTE) 4 MG gum; Take 1 each (4 mg total) by mouth as needed for smoking cessation.  Dispense: 100 tablet; Refill: 3  Nicotine patches: >>>Make sure you rotate sites that you do not get skin irritation, Apply 1 patch each morning to a non-hairy skin site  If you are smoking greater than 10 cigarettes/day and weigh over 45 kg start with the nicotine patch of 21 mg a day for 6 weeks, then 14 mg a day for 2 weeks, then finished with 7 mg a day for 2 weeks, then stop   >>>If insomnia occurs you are having trouble sleeping you can take the patch off at night, and place a new one on in the morning >>>If the patch is removed at night and you have morning cravings start short acting nicotine replacement therapy such as gum or lozenges  Nicotine Gum:  >>>Smokers who smoke 25 or more cigarettes a day should use 4 mg dose  Proper chewing of gum is important for optimal results.   >>>Do not chew gum to rapidly.  Once you start chewing eating tasty peppery taste and slide the gum to your cheek.  When the taste disappears to a few more times.  Repeat this for 30 minutes.  Then discard the gum.  >>>Avoid acidic beverages such  as coffee, carbonated beverages before and during gum use.  A soft acidic beverages lower oral pH which cause nicotine to not be absorbed properly >>>If you chew the gum too quickly or vigorously you could have nausea, vomiting, abdominal pain, constipation, hiccups, headache, sore jaw, mouth irritation ulcers   3. Abnormal findings on diagnostic imaging of lung  Continue to follow-up in the lung cancer screening program.  Repeat CT of your chest in November/2021  4. Healthcare maintenance  Please schedule an appointment with the clinical pharmacy team to review smoking cessation   Follow Up:    Return in about 4 weeks (around 02/21/2019), or if symptoms worsen or fail to improve, for Follow up with Dr. Melvyn Novas.  Schedule a 2-week  follow-up visit with clinical pharmacy team for smoking cessation  Please do your part to reduce the spread of COVID-19:      Reduce your risk of any infection  and COVID19 by using the similar precautions used for avoiding the common cold or flu:  Marland Kitchen Wash your hands often with soap and warm water for at least 20 seconds.  If soap and water are not readily available, use an alcohol-based hand sanitizer with at least 60% alcohol.  . If coughing or sneezing, cover your mouth and nose by coughing or sneezing into the elbow areas of your shirt or coat, into a tissue or into your sleeve (not your hands). Langley Gauss A MASK when in public  . Avoid shaking hands with others and consider head nods or verbal greetings only. . Avoid touching your eyes, nose, or mouth with unwashed hands.  . Avoid close contact with people who are sick. . Avoid places or events with large numbers of people in one location, like concerts or sporting events. . If you have some symptoms but not all symptoms, continue to monitor at home and seek medical attention if your symptoms worsen. . If you are having a medical emergency, call 911.   Uncertain / e-Visit: [email or web address]         [facility]: [facility]: [id number]                   [facility]: [id number]     It is flu season:   >>> Best ways to protect herself from the flu: Receive the yearly flu vaccine, practice good hand hygiene washing with soap and also using hand sanitizer when available, eat a nutritious meals, get adequate rest, hydrate appropriately   Please contact the office if your symptoms worsen or you have concerns that you are not improving.   Thank you for choosing [facility] for your healthcare, and for allowing [place] to partner with you on your healthcare journey. I am  thankful to be able to provide care to you today.   Wyn Quaker FNP-C    Health Risks of Smoking Smoking cigarettes is very bad for your health. Tobacco smoke has over 200 known poisons in it. It contains the poisonous gases nitrogen oxide and carbon monoxide. There are over 60 chemicals in tobacco smoke that cause cancer. Smoking is difficult to quit because a chemical in tobacco, called nicotine, causes addiction or dependence. When you smoke and inhale, nicotine is absorbed rapidly into the bloodstream through your lungs. Both inhaled and non-inhaled nicotine may be addictive. What are the risks of cigarette smoke? Cigarette smokers have an increased risk of  many serious medical problems, including:  Lung cancer.  Lung disease, such as pneumonia, bronchitis, and emphysema.  Chest pain (angina) and heart attack because the heart is not getting enough oxygen.  Heart disease and peripheral blood vessel disease.  High blood pressure (hypertension).  Stroke.  Oral cancer, including cancer of the lip, mouth, or voice box.  Bladder cancer.  Pancreatic cancer.  Cervical cancer.  Pregnancy complications, including premature birth.  Stillbirths and smaller newborn babies, birth defects, and genetic damage to sperm.  Early menopause.  Lower estrogen level for women.  Infertility.  Facial wrinkles.  Blindness.  Increased risk of broken bones (fractures).  Senile dementia.  Stomach ulcers and internal bleeding.  Delayed wound healing and increased risk of complications during surgery.  Even smoking lightly shortens your life expectancy by several years. Because of secondhand smoke exposure, children of smokers have an increased risk of the following:  Sudden infant death syndrome (SIDS).  Respiratory infections.  Lung cancer.  Heart disease.  Ear infections. What are the benefits of quitting? There are many health benefits of quitting smoking. Here are some of  them:  Within days of quitting smoking, your risk of having a heart attack decreases, your blood flow improves, and your lung capacity improves. Blood pressure, pulse rate, and breathing patterns start returning to normal soon after quitting.  Within months, your lungs may clear up completely.  Quitting for 10 years reduces your risk of developing lung cancer and heart disease to almost that of a nonsmoker.  People who quit may see an improvement in their overall quality of life. How do I quit smoking?     Smoking is an addiction with both physical and psychological effects, and longtime habits can be hard to change. Your health care provider can recommend:  Programs and community resources, which may include group support, education, or talk therapy.  Prescription medicines to help reduce cravings.  Nicotine replacement products, such as patches, gum, and nasal sprays. Use these products only as directed. Do not replace cigarette smoking with electronic cigarettes, which are commonly called e-cigarettes. The safety of e-cigarettes is not known, and some may contain harmful chemicals.  A combination of two or more of these methods. Where to find more information  American Lung Association: www.lung.org  American Cancer Society: www.cancer.org Summary  Smoking cigarettes is very bad for your health. Cigarette smokers have an increased risk of many serious medical problems, including several cancers, heart disease, and stroke.  Smoking is an addiction with both physical and psychological effects, and longtime habits can be hard to change.  By stopping right away, you can greatly reduce the risk of medical problems for you and your family.  To help you quit smoking, your health care provider can recommend programs, community resources, prescription medicines, and nicotine replacement products such as patches, gum, and nasal sprays. This information is not intended to replace advice  given to you by your health care provider. Make sure you discuss any questions you have with your health care provider. Document Released: 04/10/2004 Document Revised: 06/04/2017 Document Reviewed: 03/07/2016 Elsevier Patient Education  2020 [employer].    Steps to Quit Smoking Smoking tobacco is the leading cause of preventable death. It can affect almost every organ in the body. Smoking puts you and people around you at risk for many serious, long-lasting (chronic) diseases. Quitting smoking can be hard, but it is one of the best things that you can do for your health. It is never  too late to quit. How do I get ready to quit? When you decide to quit smoking, make a plan to help you succeed. Before you quit:  Pick a date to quit. Set a date within the next 2 weeks to give you time to prepare.  Write down the reasons why you are quitting. Keep this list in places where you will see it often.  Tell your family, friends, and co-workers that you are quitting. Their support is important.  Talk with your doctor about the choices that may help you quit.  Find out if your health insurance will pay for these treatments.  Know the people, places, things, and activities that make you want to smoke (triggers). Avoid them. What first steps can I take to quit smoking?  Throw away all cigarettes at home, at work, and in your car.  Throw away the things that you use when you smoke, such as ashtrays and lighters.  Clean your car. Make sure to empty the ashtray.  Clean your home, including curtains and carpets. What can I do to help me quit smoking? Talk with your doctor about taking medicines and seeing a counselor at the same time. You are more likely to succeed when you do both.  If you are pregnant or breastfeeding, talk with your doctor about counseling or other ways to quit smoking. Do not take medicine to help you quit smoking unless your doctor tells you to do so. To quit smoking: Quit  right away  Quit smoking totally, instead of slowly cutting back on how much you smoke over a period of time.  Go to counseling. You are more likely to quit if you go to counseling sessions regularly. Take medicine You may take medicines to help you quit. Some medicines need a prescription, and some you can buy over-the-counter. Some medicines may contain a drug called nicotine to replace the nicotine in cigarettes. Medicines may:  Help you to stop having the desire to smoke (cravings).  Help to stop the problems that come when you stop smoking (withdrawal symptoms). Your doctor may ask you to use:  Nicotine patches, gum, or lozenges.  Nicotine inhalers or sprays.  Non-nicotine medicine that is taken by mouth. Find resources Find resources and other ways to help you quit smoking and remain smoke-free after you quit. These resources are most helpful when you use them often. They include:  Online chats with a Social worker.  Phone quitlines.  Printed Furniture conservator/restorer.  Support groups or group counseling.  Text messaging programs.  Mobile phone apps. Use apps on your mobile phone or tablet that can help you stick to your quit plan. There are many free apps for mobile phones and tablets as well as websites. Examples include Quit Guide from the [employer] and smokefree.gov  What things can I do to make it easier to quit?   Talk to your family and friends. Ask them to support and encourage you.  Call a phone quitline (1-800-QUIT-NOW), reach out to support groups, or work with a Social worker.  Ask people who smoke to not smoke around you.  Avoid places that make you want to smoke, such as: ? Bars. ? Parties. ? Smoke-break areas at work.  Spend time with people who do not smoke.  Lower the stress in your life. Stress can make you want to smoke. Try these things to help your stress: ? Getting regular exercise. ? Doing deep-breathing exercises. ? Doing yoga. ? Meditating. ? Doing a  body scan.  To do this, close your eyes, focus on one area of your body at a time from head to toe. Notice which parts of your body are tense. Try to relax the muscles in those areas. How will I feel when I quit smoking? Day 1 to 3 weeks Within the first 24 hours, you may start to have some problems that come from quitting tobacco. These problems are very bad 2-3 days after you quit, but they do not often last for more than 2-3 weeks. You may get these symptoms:  Mood swings.  Feeling restless, nervous, angry, or annoyed.  Trouble concentrating.  Dizziness.  Strong desire for high-sugar foods and nicotine.  Weight gain.  Trouble pooping (constipation).  Feeling like you may vomit (nausea).  Coughing or a sore throat.  Changes in how the medicines that you take for other issues work in your body.  Depression.  Trouble sleeping (insomnia). Week 3 and afterward After the first 2-3 weeks of quitting, you may start to notice more positive results, such as:  Better sense of smell and taste.  Less coughing and sore throat.  Slower heart rate.  Lower blood pressure.  Clearer skin.  Better breathing.  Fewer sick days. Quitting smoking can be hard. Do not give up if you fail the first time. Some people need to try a few times before they succeed. Do your best to stick to your quit plan, and talk with your doctor if you have any questions or concerns. Summary  Smoking tobacco is the leading cause of preventable death. Quitting smoking can be hard, but it is one of the best things that you can do for your health.  When you decide to quit smoking, make a plan to help you succeed.  Quit smoking right away, not slowly over a period of time.  When you start quitting, seek help from your doctor, family, or friends. This information is not intended to replace advice given to you by your health care provider. Make sure you discuss any questions you have with your health care  provider. Document Released: 12/28/2008 Document Revised: 05/21/2018 Document Reviewed: 05/22/2018 Elsevier Patient Education  2020 [employer].

## 2019-01-24 NOTE — Assessment & Plan Note (Signed)
Currently smoking 1 pack/day Known triggers for stress due to COVID-19 pandemic and 2020 election Recently completed a 2020 CT lung cancer screening which was a lung RADS 2  Plan: Schedule 2 week follow-up with clinical pharmacy team for smoking cessation Follow-up in office in 4 weeks with Dr. Melvyn Novas Complete lung cancer screening CT as ordered November/2021

## 2019-01-24 NOTE — Assessment & Plan Note (Signed)
Plan: Repeat lung cancer screening CT as ordered in November/2021

## 2019-01-24 NOTE — Assessment & Plan Note (Signed)
Plan: Continue Symbicort 160 Continue rescue inhaler as needed Emphasized need for the patient to stop smoking Patient to follow-up with our office in 2 weeks for smoking cessation with clinical pharmacy team Follow-up with Dr. Melvyn Novas in 4 weeks

## 2019-01-25 ENCOUNTER — Ambulatory Visit: Payer: BLUE CROSS/BLUE SHIELD

## 2019-01-25 NOTE — Progress Notes (Signed)
Chart and office note reviewed in detail  > agree with a/p as outlined    

## 2019-01-27 ENCOUNTER — Other Ambulatory Visit: Payer: Self-pay | Admitting: *Deleted

## 2019-01-27 DIAGNOSIS — Z87891 Personal history of nicotine dependence: Secondary | ICD-10-CM

## 2019-01-27 DIAGNOSIS — Z122 Encounter for screening for malignant neoplasm of respiratory organs: Secondary | ICD-10-CM

## 2019-01-27 DIAGNOSIS — F17210 Nicotine dependence, cigarettes, uncomplicated: Secondary | ICD-10-CM

## 2019-02-04 NOTE — Progress Notes (Signed)
Subjective Patient presents to [facility] and seen by the pharmacist for smoking cessation counseling. She has a past medical history of COPD and hyperparathyroidism.  She is currently taking Symbicort 160/4.5 mcg 2 puffs twice daily and albuterol as needed for COPD. She has been smoking since she was 65 and was only able to quit while she was pregnant.  Her husband smokes as well.  They smoke in the house.  She is currently using nicotine gum which she states helps with cravings and helped decrease the amount she smokes.    Social History   Tobacco Use  Smoking Status Current Every Day Smoker  . Packs/day: 1.00  . Years: 41.00  . Pack years: 41.00  . Types: Cigarettes  Smokeless Tobacco Never Used  Tobacco Comment   Encouraged to develop a plan or come to me for assistance when ready to set a date.     Tobacco Use History  Age when started using tobacco on a daily basis: 5.  Type: cigarettes.  Number of cigarettes per day 1/2 to 1 pack, brand [employer].  Smokes first cigarette 5 minutes after waking.  Does not wake at night to smoke  Triggers include using bathroom, stress, after a meal  Quit Attempt History   Most recent quit attempt was Monday.  Longest time ever been tobacco free while pregnant (~9  Months)  Methods tried in the past include Bupropion (Zyban) and Wellbutrin and nicotine gum.   Rates IMPORTANCE of quitting tobacco on 1-10 scale of 5.    Rates READINESS of quitting tobacco on 1-10 scale of 5.  Rates CONFIDENCE of quitting tobacco on 1-10 scale of 3. Due to multiple attempts in the past.  Motivators to quitting include health, breathing, endurance; barriers include husband smoking and being bored.  Immunization History  Administered Date(s) Administered  . Fluad Quad(high Dose 65+) 12/13/2018  . Influenza-Unspecified 12/15/2017  . Pneumococcal Conjugate-13 12/15/2017  . Zoster Recombinat (Shingrix) 04/13/2018, 11/04/2018    Assessment and Plan  1)Smoking CessationPatient   After reviewing smoking history and assessing readiness she is not ready to quit smoking.  She has multiple attempts in the past without success.  She tried Wellbutrin in the past but stopped due to insomnia.  Patient was taking the medication at lunch.  She is currently using nicotine gum which has helped her decrease the amount of cigarettes she smoke from a full pack to a half a pack.  She was given a prescription for the nicotine patch which she has not used.  She wants to continue with nicotine gum at this time and consider other therapies when she is ready to quit.  Counseled on the benefits or quitting smoking.  Reviewed the [place] for when she is ready to quit smoking.  Discussed strategies to help patient to prepare to quit smoking.  Patient agreeable to not smoking in the house, picking two alternative activities to try when she has the urge to smoke and a goal of decreasing to smoking 5 cigarettes a day over the next 2 months.  Patient is to return for an appointment with the pharmacy team in 2 months to assess her progress and readiness to quit.  Provided information on 1 800-QUIT NOW support program.   2) Inhaler Education Patient was counseled on the purpose, proper use, and adverse effects of Symbicort inhaler.  Patient not rinsing mouth out after use.  Instructed patient to rinse mouth with water after using in order to  prevent fungal infection.  Patient verbalized understanding.  Reviewed appropriate use of maintenance vs rescue inhalers.  Stressed importance of using maintenance inhaler daily and rescue inhaler only as needed.  Patient verbalized understanding.  Demonstrated proper inhaler technique using Symbicort demo inhaler.  Patient able to demonstrate proper inhaler technique using teach back method.   3) Medication Reconciliation A drug regimen assessment was performed, including review of allergies,  interactions, disease-state management, dosing and immunization history. Medications were reviewed with the patient, including name, instructions, indication, goals of therapy, potential side effects, importance of adherence, and safe use.  No major drug interactions identified.  4) Immunizations Patient is up to date with annual influenza vaccine.  She has received Prevnar 13 but not Pneumovax 23.  Due for Pneumovax 23.  Patient states she may have already received Pneumovax 23.  She will check with her PCP.  All questions encouraged and answered.  Instructed patient to call with any questions or concerns.  Mariella Saa, PharmD, [place], [place] Clinical Specialty Pharmacist [phone]  02/18/2019 3:13 PM

## 2019-02-07 ENCOUNTER — Ambulatory Visit: Payer: BLUE CROSS/BLUE SHIELD

## 2019-02-18 ENCOUNTER — Ambulatory Visit (INDEPENDENT_AMBULATORY_CARE_PROVIDER_SITE_OTHER): Payer: Medicare Other | Admitting: Pharmacist

## 2019-02-18 ENCOUNTER — Other Ambulatory Visit: Payer: Self-pay

## 2019-02-18 DIAGNOSIS — J449 Chronic obstructive pulmonary disease, unspecified: Secondary | ICD-10-CM | POA: Diagnosis not present

## 2019-02-18 DIAGNOSIS — F17210 Nicotine dependence, cigarettes, uncomplicated: Secondary | ICD-10-CM

## 2019-03-04 ENCOUNTER — Ambulatory Visit: Payer: BLUE CROSS/BLUE SHIELD | Admitting: Internal Medicine

## 2019-04-06 ENCOUNTER — Ambulatory Visit: Payer: Medicare Other | Attending: Internal Medicine

## 2019-04-06 DIAGNOSIS — Z23 Encounter for immunization: Secondary | ICD-10-CM | POA: Insufficient documentation

## 2019-04-06 NOTE — Progress Notes (Signed)
   Covid-19 Vaccination Clinic  Name:  CARRIANNE CUTTING    MRN: [id number] DOB: [birth year]  04/06/2019  Ms. Aven was observed post Covid-19 immunization for 15 minutes without incidence. She was provided with Vaccine Information Sheet and instruction to access the V-Safe system.   Ms. Boudrie was instructed to call 911 with any severe reactions post vaccine: Marland Kitchen Difficulty breathing  . Swelling of your face and throat  . A fast heartbeat  . A bad rash all over your body  . Dizziness and weakness    Immunizations Administered    Name Date Dose VIS Date Route   Pfizer COVID-19 Vaccine 04/06/2019 12:44 PM 0.3 mL 02/25/2019 Intramuscular   Manufacturer: [place]   Lot: [id number]   [place]: [id number]

## 2019-04-15 DIAGNOSIS — Z0000 Encounter for general adult medical examination without abnormal findings: Secondary | ICD-10-CM | POA: Diagnosis not present

## 2019-04-15 DIAGNOSIS — E785 Hyperlipidemia, unspecified: Secondary | ICD-10-CM | POA: Diagnosis not present

## 2019-04-15 DIAGNOSIS — Z720 Tobacco use: Secondary | ICD-10-CM | POA: Diagnosis not present

## 2019-04-15 DIAGNOSIS — I10 Essential (primary) hypertension: Secondary | ICD-10-CM | POA: Diagnosis not present

## 2019-04-15 DIAGNOSIS — F322 Major depressive disorder, single episode, severe without psychotic features: Secondary | ICD-10-CM | POA: Diagnosis not present

## 2019-04-15 DIAGNOSIS — D72829 Elevated white blood cell count, unspecified: Secondary | ICD-10-CM | POA: Diagnosis not present

## 2019-04-15 DIAGNOSIS — F909 Attention-deficit hyperactivity disorder, unspecified type: Secondary | ICD-10-CM | POA: Diagnosis not present

## 2019-04-27 ENCOUNTER — Ambulatory Visit: Payer: Medicare Other | Attending: Internal Medicine

## 2019-04-27 DIAGNOSIS — Z23 Encounter for immunization: Secondary | ICD-10-CM

## 2019-04-27 NOTE — Progress Notes (Signed)
   Covid-19 Vaccination Clinic  Name:  Maureen Chung    MRN: [id number] DOB: [birth year]  04/27/2019  [name] was observed post Covid-19 immunization for 15 minutes without incidence. She was provided with Vaccine Information Sheet and instruction to access the V-Safe system.   [name] was instructed to call 911 with any severe reactions post vaccine: Marland Kitchen Difficulty breathing  . Swelling of your face and throat  . A fast heartbeat  . A bad rash all over your body  . Dizziness and weakness    Immunizations Administered    Name Date Dose VIS Date Route   Pfizer COVID-19 Vaccine 04/27/2019  8:25 AM 0.3 mL 02/25/2019 Intramuscular   Manufacturer: [employer]   Lot: [id number]   [place]: [id number]

## 2019-05-03 DIAGNOSIS — N3945 Continuous leakage: Secondary | ICD-10-CM | POA: Diagnosis not present

## 2019-05-03 DIAGNOSIS — Z6822 Body mass index (BMI) 22.0-22.9, adult: Secondary | ICD-10-CM | POA: Diagnosis not present

## 2019-05-03 DIAGNOSIS — Z124 Encounter for screening for malignant neoplasm of cervix: Secondary | ICD-10-CM | POA: Diagnosis not present

## 2019-05-12 DIAGNOSIS — D72829 Elevated white blood cell count, unspecified: Secondary | ICD-10-CM | POA: Diagnosis not present

## 2019-05-12 DIAGNOSIS — E785 Hyperlipidemia, unspecified: Secondary | ICD-10-CM | POA: Diagnosis not present

## 2019-05-12 DIAGNOSIS — Z0000 Encounter for general adult medical examination without abnormal findings: Secondary | ICD-10-CM | POA: Diagnosis not present

## 2019-05-17 DIAGNOSIS — N958 Other specified menopausal and perimenopausal disorders: Secondary | ICD-10-CM | POA: Diagnosis not present

## 2019-05-17 DIAGNOSIS — M8588 Other specified disorders of bone density and structure, other site: Secondary | ICD-10-CM | POA: Diagnosis not present

## 2019-05-19 DIAGNOSIS — Z23 Encounter for immunization: Secondary | ICD-10-CM | POA: Diagnosis not present

## 2019-06-14 DIAGNOSIS — Z1231 Encounter for screening mammogram for malignant neoplasm of breast: Secondary | ICD-10-CM | POA: Diagnosis not present

## 2019-06-28 DIAGNOSIS — M810 Age-related osteoporosis without current pathological fracture: Secondary | ICD-10-CM | POA: Diagnosis not present

## 2019-07-06 NOTE — Progress Notes (Deleted)
Cardiology Office Note   Date:  07/06/2019   ID:  Maureen Chung, DOB 07-16-1950, MRN [id number]  PCP:  London Pepper, MD  Cardiologist: Dr. Johnsie Cancel, MD   No chief complaint on file.     History of Present Illness: Maureen Chung is a 69 y.o. female who presents for 23-month follow-up, seen for Dr. Johnsie Cancel.   Maureen Chung has a history of tobacco use, family history of premature CAD whowas lastseen by Dr.Nishan10/31/2018 with complaints of dyspnea, fatigue and LE edema. She also had some atypical chest pain described as sharp and fleeting with radiation to her left arm. Her dyspnea was thought to be related to her long-term tobacco use and plans for follow-up were made.   She underwent a Myoview stress test 08/31/2015 which was considered a low risk however with a small area of mild apical ischemia which showed 50 to 55% LVEF and regional wall motion abnormalities with G1 DD. There was evidence of moderate mitral valve regurgitation at that time. Follow-up cardiac cath performed 10/18/2015 with no significant CAD as below.   She was then seen on 10/27/2018 withcomplaints ofexertional dyspneaand fatigue that was constant since 03/2018. Additionally, shewas havingissues with lower extremity edema which sounded like dependent edema possible LV dysfunction could not be excluded.  On 11/10/2018 she was seen for 2-week follow-up. Her echocardiogram was performed 11/02/2018 with normal LVEF at 65%. There was no regional wall motion abnormalities and no valvular disease. BNP was noted to be markedly elevated at 1536.Given this, her p.o. Lasix was increased from 20 mg daily to 40 mg daily.  Also stated that she had been under tremendous amount of stress. She was following with a psychiatrist and psychologist for ongoing issues with anxiety and depression. Likely her symptoms were  multifactorial with respiratory and psychological concerns.  She was seen by Dr. Melvyn Novas on 11/15/2018.  She was started on Symbicort for possible COPD. Most recent lung cancer screening CT 01/17/2019 with pulmonary nodules with the largest being 5.4 mm.  Today she presents for follow-up and is doing well from a cardiac perspective.  She states she has been seeing her psychiatrist once a week since her last visit and this is really helping her.  She appears that she is doing much better than the last time I saw her.  Her dyspnea is improved along with her very mild LE swelling.  Plan for pulmonary as above.  She denies chest pain, orthopnea, dizziness, nausea, vomiting or syncope.  Reports she was seen by her PCP after her last visit and her creatinine was mildly elevated to 1.3.  We discussed changing her Lasix to as needed given that she is euvolemic on exam with a normal echocardiogram.   1.Dyspnea: -Reports improvement in her symptoms since our last office visit.  She was seen by pulmonary medicine who started her on Symbicort and plan for PFTs 12/13/2018.  -Doubt cardiac etiology for her symptoms.  Currently being treated for COPD.  We discussed tobacco cessation as she is continues to smoke approximately three fourths a pack of cigarettes per day.  This is currently her biggest issue at the moment. -We will reduce her Lasix to 20 mg as needed for lower extremity swelling -Labs today with close follow-up in 4 to 6 months.  2. HLD: -Last LDL,89>>>placed on statin per PCP  3. CKD stage II: -Last creatinine, 1.05 on 10/27/2018 -Reports labs by PCP with elevated creatinine at 1.3. -We will reduce her Lasix 20  mg to as needed for lower extremity swelling as she appears euvolemic on exam -I do not think that she needs a daily diuretic at this time  4. Tobacco abuse: -Continues to smoke 3/4 pk/day  -Cessation strongly encouraged -Underwent carotid Dopplers per PCP which were negative for stenosis -Saw pulmonary, Dr. Melvyn Novas on 11/15/2018 with plans for Symbicort and follow up PFTs to be  performed on 12/13/2018>> dyspnea improved -Needs low-dose chest CT without contrast recommended 1 year>>this was never obtained  Past Medical History:  Diagnosis Date  . Anxiety   . Arthritis   . Hypercalcemia   . Hyperlipidemia   . Hyperparathyroidism   . Insomnia   . Osteoporosis   . Pericarditis     Past Surgical History:  Procedure Laterality Date  . BREAST CYST EXCISION  1997   benign  . CARDIAC CATHETERIZATION N/A 10/18/2015   Procedure: Left Heart Cath and Coronary Angiography;  Surgeon: Josue Hector, MD;  Location: [facility];  Service: Cardiovascular;  Laterality: N/A;  . child birth  7  . LAPAROSCOPIC ENDOMETRIOSIS FULGURATION  1989, 1995  . PARATHYROIDECTOMY  11/01/10  . TONSILECTOMY, ADENOIDECTOMY, BILATERAL MYRINGOTOMY AND TUBES  1969  . VAGINAL HYSTERECTOMY  1981   without ovaries     Current Outpatient Medications  Medication Sig Dispense Refill  . albuterol (VENTOLIN HFA) 108 (90 Base) MCG/ACT inhaler Inhale 2 puffs into the lungs every 6 (six) hours as needed for wheezing or shortness of breath. 6.7 g 3  . amphetamine-dextroamphetamine (ADDERALL) 20 MG tablet Take 20 mg by mouth 2 (two) times daily.     Marland Kitchen aspirin EC 81 MG tablet Take 1 tablet (81 mg total) by mouth daily. 90 tablet 3  . atorvastatin (LIPITOR) 10 MG tablet Take 10 mg by mouth daily.    . budesonide-formoterol (SYMBICORT) 160-4.5 MCG/ACT inhaler Inhale 2 puffs into the lungs 2 (two) times daily. 1 Inhaler 11  . Cholecalciferol (VITAMIN D3) 5000 units TABS Take 10,000 Units by mouth daily.     . clonazePAM (KLONOPIN) 0.5 MG tablet Take 0.5 mg by mouth as needed.    . furosemide (LASIX) 20 MG tablet Take 1 tablet (20 mg total) by mouth as needed for fluid or edema. (Patient not taking: Reported on 02/18/2019) 90 tablet 3  . Magnesium Oxide (MAG-OXIDE PO) Take 1 tablet by mouth daily.    . metoprolol succinate (TOPROL-XL) 25 MG 24 hr tablet Take 25 mg by mouth daily.    . nitroGLYCERIN  (NITROSTAT) 0.4 MG SL tablet Place 1 tablet (0.4 mg total) under the tongue every 5 (five) minutes as needed for chest pain. 25 tablet 3  . potassium chloride SA (K-DUR) 20 MEQ tablet Take 1 tablet (20 mEq total) by mouth daily. (Patient not taking: Reported on 02/18/2019) 90 tablet 3  . sertraline (ZOLOFT) 100 MG tablet Take 200 mg by mouth daily.     No current facility-administered medications for this visit.    Allergies:   Latex    Social History:  The patient  reports that she has been smoking cigarettes. She has a 41.00 pack-year smoking history. She has never used smokeless tobacco. She reports current alcohol use. She reports that she does not use drugs.   Family History:  The patient's ***family history includes Bladder Cancer in her brother; Cancer in her mother; Coronary artery disease in her brother; Diabetes in her father; Heart disease in her brother and father.    ROS:  Please see the  history of present illness.   Otherwise, review of systems are positive for {NONE DEFAULTED:18576::"none"}.   All other systems are reviewed and negative.    PHYSICAL EXAM: VS:  There were no vitals taken for this visit. , BMI There is no height or weight on file to calculate BMI. GEN: Well nourished, well developed, in no acute distress HEENT: normal Neck: no JVD, carotid bruits, or masses Cardiac: ***RRR; no murmurs, rubs, or gallops,no edema  Respiratory:  clear to auscultation bilaterally, normal work of breathing GI: soft, nontender, nondistended, + BS MS: no deformity or atrophy Skin: warm and dry, no rash Neuro:  Strength and sensation are intact Psych: euthymic mood, full affect   EKG:  EKG {ACTION; IS/IS GI:[id number] ordered today. The ekg ordered today demonstrates ***   Recent Labs: 10/27/2018: NT-Pro BNP 1,536 12/07/2018: BUN 16; Creatinine, Ser 1.25; Potassium 4.7; Sodium 142    Lipid Panel No results found for: CHOL, TRIG, HDL, CHOLHDL, VLDL, LDLCALC, LDLDIRECT     Wt Readings from Last 3 Encounters:  01/24/19 123 lb (55.8 kg)  12/13/18 122 lb (55.3 kg)  12/07/18 123 lb 6.4 oz (56 kg)      Other studies Reviewed: Additional studies/ records that were reviewed today include: ***. Review of the above records demonstrates: ***  Cardiac catheterization 10/18/2015:  Prox LAD to Mid LAD lesion, 20 %stenosed.  No significant CAD Mild calcification of proximal LAD Normal EF 65%  Myoview stress test 08/31/2015:  Nuclear stress EF: 50%.  Upsloping ST segment depression ST segment depression of 1 mm was noted during stress in the V5, V6, III and aVF leads.  This is a low risk study.  The left ventricular ejection fraction is mildly decreased (45-54%).  Findings consistent with ischemia.  Echocardiogram 08/31/2015: - Left ventricle: The cavity size was normal. Wall thickness was normal. Systolic function was normal. The estimated ejection fraction was in the range of 50% to 55%. Wall motion was normal; there were no regional wall motion abnormalities. Doppler parameters are consistent with abnormal left ventricular relaxation (grade 1 diastolic dysfunction). The E/e&' ratio is between 8-15, suggesting indeterminate LV filling pressure. - Mitral valve: Mildly thickened leaflets with elongated and redundant leaflet tips. There was mild to moderate regurgitation. - Left atrium: The atrium was normal in size. - Atrial septum: Aneurysmal IAS - cannot exclude small PFO. - Inferior vena cava: The vessel was normal in size. The respirophasic diameter changes were in the normal range (>= 50%), consistent with normal central venous pressure.  Impressions:  - LVEF 50-55%, normal wall thickness, normal wall motion, diastolic dysfunction, indeterminate LV filling pressure, mildly thickened leaflets with elongated leaflet tips, mild to moderate mitral regurgitation, normal LA size, aneurysmal IAS - cannot exclude small  PFO, normal IVC.  Small area of mild apical ischemia. ECG positive with 1 mm upsloping ST segment depression Surface images with EF 50% mild apical hypokinesis   ASSESSMENT AND PLAN:  1.  ***   Current medicines are reviewed at length with the patient today.  The patient {ACTIONS; HAS/DOES NOT HAVE:19233} concerns regarding medicines.  The following changes have been made:  {PLAN; NO CHANGE:13088:s}  Labs/ tests ordered today include: *** No orders of the defined types were placed in this encounter.    Disposition:   FU with *** in {gen number [id number] {Days to years:10300}  Signed, Kathyrn Drown, NP  07/06/2019 1:47 PM    [facility] [address], [place], [place]  [address]  Phone: [phone]; Fax: [phone]

## 2019-07-08 ENCOUNTER — Ambulatory Visit: Payer: BLUE CROSS/BLUE SHIELD | Admitting: Cardiology

## 2019-07-11 NOTE — Progress Notes (Deleted)
Cardiology Office Note   Date:  07/11/2019   ID:  MURJANI ELMAN, DOB [birth year], MRN [id number]  PCP:  London Pepper, MD  Cardiologist:  Dr. Johnsie Cancel, MD   No chief complaint on file.     History of Present Illness: Maureen Chung is a 69 y.o. female who presents for 92-month follow-up, seen for Dr. Johnsie Cancel.   [name] has a history of tobacco use, family history of premature CAD whowas lastseen by Dr.Nishan10/31/2018 with complaints of dyspnea, fatigue and LE edema. She also had some atypical chest pain described as sharp and fleeting with radiation to her left arm. Her dyspnea was thought to be related to her long-term tobacco use and plans for follow-up were made.  She underwent a Myoview stress test 08/31/2015 which was considered a low risk however with a small area of mild apical ischemia which showed 50 to 55% LVEFandregional wall motion abnormalities withG1 DD. There was evidence of moderate mitral valve regurgitation at that time. Follow-up cardiac cath performed 10/18/2015 with no significant CAD as below.  She wasthenseen on 10/27/2018 withcomplaints ofexertional dyspneaand fatigue that was constant since 03/2018. Additionally, shewas havingissues with lower extremity edema which soundedlike dependent edema possible LV dysfunction could not be excluded.  On 11/10/2018 she was seen for2-week follow-up. Her echocardiogram was performed 11/02/2018 with normal LVEF at 65%. There was no regional wall motion abnormalities and no valvular disease. BNP was noted to be markedly elevated at 1536.Given this, her p.o. Lasix was increased from 20 mg daily to 40 mg daily.  She was seen by Dr. Melvyn Novas on 11/15/2018. Shewas started on Symbicort for possible COPD. Most recent lung cancer screening CT 01/17/2019 with pulmonary nodules with the largest being 5.4 mm.  I saw her in follow up and she was doing well. She bwas seeing her psychiatrist.   Today she    1.Dyspnea: -Reportsimprovement in her symptoms since our last office visit. She was seen by pulmonary medicine who started her on Symbicort and plan for PFTs 12/13/2018.  -Doubt cardiac etiology for her symptoms.Currently being treated for COPD. We discussed tobacco cessation as she is continues to smoke approximately three fourths a pack of cigarettes per day. This is currently her biggest issue at the moment. -We will reduce her Lasix to 20 mg as needed for lower extremity swelling -Labs today with close follow-up in 4 to 6 months.  2. HLD: -Last LDL,89>>>placed on statin per PCP  3. CKD stage II: -Last creatinine, 1.05on 10/27/2018 -Reports labs by PCP with elevated creatinine at 1.3. -We will reduce her Lasix 20 mg to as needed for lower extremity swelling as she appears euvolemic on exam -I do not think that she needs a daily diuretic at this time  4. Tobacco abuse: -Continues to smoke3/4 pk/day -Cessation strongly encouraged -Underwent carotid Dopplers per PCP which were negative for stenosis -Saw pulmonary, Dr. Melvyn Novas on 11/15/2018 with plans for Symbicort and follow up PFTs to be performed on 12/13/2018>>dyspnea improved -Needs low-dose chest CT without contrast recommended 1 year>>this was never obtained    Past Medical History:  Diagnosis Date  . Anxiety   . Arthritis   . Hypercalcemia   . Hyperlipidemia   . Hyperparathyroidism   . Insomnia   . Osteoporosis   . Pericarditis     Past Surgical History:  Procedure Laterality Date  . BREAST CYST EXCISION  1997   benign  . CARDIAC CATHETERIZATION N/A 10/18/2015   Procedure: Left Heart Cath  and Coronary Angiography;  Surgeon: Josue Hector, MD;  Location: [facility];  Service: Cardiovascular;  Laterality: N/A;  . child birth  39  . LAPAROSCOPIC ENDOMETRIOSIS FULGURATION  1989, 1995  . PARATHYROIDECTOMY  11/01/10  . TONSILECTOMY, ADENOIDECTOMY, BILATERAL MYRINGOTOMY AND TUBES  1969  .  VAGINAL HYSTERECTOMY  1981   without ovaries     Current Outpatient Medications  Medication Sig Dispense Refill  . albuterol (VENTOLIN HFA) 108 (90 Base) MCG/ACT inhaler Inhale 2 puffs into the lungs every 6 (six) hours as needed for wheezing or shortness of breath. 6.7 g 3  . amphetamine-dextroamphetamine (ADDERALL) 20 MG tablet Take 20 mg by mouth 2 (two) times daily.     Marland Kitchen aspirin EC 81 MG tablet Take 1 tablet (81 mg total) by mouth daily. 90 tablet 3  . atorvastatin (LIPITOR) 10 MG tablet Take 10 mg by mouth daily.    . budesonide-formoterol (SYMBICORT) 160-4.5 MCG/ACT inhaler Inhale 2 puffs into the lungs 2 (two) times daily. 1 Inhaler 11  . Cholecalciferol (VITAMIN D3) 5000 units TABS Take 10,000 Units by mouth daily.     . clonazePAM (KLONOPIN) 0.5 MG tablet Take 0.5 mg by mouth as needed.    . furosemide (LASIX) 20 MG tablet Take 1 tablet (20 mg total) by mouth as needed for fluid or edema. (Patient not taking: Reported on 02/18/2019) 90 tablet 3  . Magnesium Oxide (MAG-OXIDE PO) Take 1 tablet by mouth daily.    . metoprolol succinate (TOPROL-XL) 25 MG 24 hr tablet Take 25 mg by mouth daily.    . nitroGLYCERIN (NITROSTAT) 0.4 MG SL tablet Place 1 tablet (0.4 mg total) under the tongue every 5 (five) minutes as needed for chest pain. 25 tablet 3  . potassium chloride SA (K-DUR) 20 MEQ tablet Take 1 tablet (20 mEq total) by mouth daily. (Patient not taking: Reported on 02/18/2019) 90 tablet 3  . sertraline (ZOLOFT) 100 MG tablet Take 200 mg by mouth daily.     No current facility-administered medications for this visit.    Allergies:   Latex    Social History:  The patient  reports that she has been smoking cigarettes. She has a 41.00 pack-year smoking history. She has never used smokeless tobacco. She reports current alcohol use. She reports that she does not use drugs.   Family History:  The patient's family history includes Bladder Cancer in her brother; Cancer in her mother;  Coronary artery disease in her brother; Diabetes in her father; Heart disease in her brother and father.    ROS:  Please see the history of present illness.   Otherwise, review of systems are positive for none. All other systems are reviewed and negative.    PHYSICAL EXAM: VS:  There were no vitals taken for this visit. , BMI There is no height or weight on file to calculate BMI.   General: Well developed, well nourished, NAD Skin: Warm, dry, intact  Head: Normocephalic, atraumatic, sclera non-icteric, no xanthomas, clear, moist mucus membranes. Neck: Negative for carotid bruits. No JVD Lungs:Clear to ausculation bilaterally. No wheezes, rales, or rhonchi. Breathing is unlabored. Cardiovascular: RRR with S1 S2. No murmurs, rubs, gallops, or LV heave appreciated. Abdomen: Soft, non-tender, non-distended with normoactive bowel sounds. No hepatomegaly, No rebound/guarding. No obvious abdominal masses. MSK: Strength and tone appear normal for age. 5/5 in all extremities Extremities: No edema. No clubbing or cyanosis. DP/PT pulses 2+ bilaterally Neuro: Alert and oriented. No focal deficits. No facial asymmetry.  MAE spontaneously. Psych: Responds to questions appropriately with normal affect.      EKG:  EKG {ACTION; IS/IS [id number] ordered today. The ekg ordered today demonstrates ***   Recent Labs: 10/27/2018: NT-Pro BNP 1,536 12/07/2018: BUN 16; Creatinine, Ser 1.25; Potassium 4.7; Sodium 142    Lipid Panel No results found for: CHOL, TRIG, HDL, CHOLHDL, VLDL, LDLCALC, LDLDIRECT    Wt Readings from Last 3 Encounters:  01/24/19 123 lb (55.8 kg)  12/13/18 122 lb (55.3 kg)  12/07/18 123 lb 6.4 oz (56 kg)    Other studies Reviewed: Additional studies/ records that were reviewed today include:  Review of the above records demonstrates:   Cardiac catheterization 10/18/2015:  Prox LAD to Mid LAD lesion, 20 %stenosed.  No significant CAD Mild calcification of proximal  LAD Normal EF 65%  Myoview stress test 08/31/2015:  Nuclear stress EF: 50%.  Upsloping ST segment depression ST segment depression of 1 mm was noted during stress in the V5, V6, III and aVF leads.  This is a low risk study.  The left ventricular ejection fraction is mildly decreased (45-54%).  Findings consistent with ischemia.  Echocardiogram 08/31/2015: - Left ventricle: The cavity size was normal. Wall thickness was normal. Systolic function was normal. The estimated ejection fraction was in the range of 50% to 55%. Wall motion was normal; there were no regional wall motion abnormalities. Doppler parameters are consistent with abnormal left ventricular relaxation (grade 1 diastolic dysfunction). The E/e&' ratio is between 8-15, suggesting indeterminate LV filling pressure. - Mitral valve: Mildly thickened leaflets with elongated and redundant leaflet tips. There was mild to moderate regurgitation. - Left atrium: The atrium was normal in size. - Atrial septum: Aneurysmal IAS - cannot exclude small PFO. - Inferior vena cava: The vessel was normal in size. The respirophasic diameter changes were in the normal range (>= 50%), consistent with normal central venous pressure.  Impressions:  - LVEF 50-55%, normal wall thickness, normal wall motion, diastolic dysfunction, indeterminate LV filling pressure, mildly thickened leaflets with elongated leaflet tips, mild to moderate mitral regurgitation, normal LA size, aneurysmal IAS - cannot exclude small PFO, normal IVC.  Small area of mild apical ischemia. ECG positive with 1 mm upsloping ST segment depression Surface images with EF 50% mild apical hypokinesis    ASSESSMENT AND PLAN:  1.  ***   Current medicines are reviewed at length with the patient today.  The patient {ACTIONS; HAS/DOES NOT HAVE:19233} concerns regarding medicines.  The following changes have been made:  {PLAN; NO  CHANGE:13088:s}  Labs/ tests ordered today include: *** No orders of the defined types were placed in this encounter.    Disposition:   FU with *** in {gen number [id number] {Days to years:10300}  Signed, Kathyrn Drown, NP  07/11/2019 10:27 AM    [facility] [place], [place], [place]  [address] Phone: [phone]; Fax: [phone]

## 2019-07-19 DIAGNOSIS — F909 Attention-deficit hyperactivity disorder, unspecified type: Secondary | ICD-10-CM | POA: Diagnosis not present

## 2019-07-19 DIAGNOSIS — E785 Hyperlipidemia, unspecified: Secondary | ICD-10-CM | POA: Diagnosis not present

## 2019-07-19 DIAGNOSIS — Z720 Tobacco use: Secondary | ICD-10-CM | POA: Diagnosis not present

## 2019-07-19 DIAGNOSIS — M79601 Pain in right arm: Secondary | ICD-10-CM | POA: Diagnosis not present

## 2019-07-19 DIAGNOSIS — I10 Essential (primary) hypertension: Secondary | ICD-10-CM | POA: Diagnosis not present

## 2019-07-19 DIAGNOSIS — J439 Emphysema, unspecified: Secondary | ICD-10-CM | POA: Diagnosis not present

## 2019-07-19 DIAGNOSIS — F418 Other specified anxiety disorders: Secondary | ICD-10-CM | POA: Diagnosis not present

## 2019-07-21 ENCOUNTER — Ambulatory Visit: Payer: BLUE CROSS/BLUE SHIELD | Admitting: Cardiology

## 2019-07-29 ENCOUNTER — Ambulatory Visit (INDEPENDENT_AMBULATORY_CARE_PROVIDER_SITE_OTHER): Payer: Medicare Other

## 2019-07-29 ENCOUNTER — Other Ambulatory Visit: Payer: Self-pay

## 2019-07-29 ENCOUNTER — Ambulatory Visit: Payer: Self-pay

## 2019-07-29 ENCOUNTER — Ambulatory Visit (INDEPENDENT_AMBULATORY_CARE_PROVIDER_SITE_OTHER): Payer: Medicare Other | Admitting: Orthopedic Surgery

## 2019-07-29 DIAGNOSIS — M79601 Pain in right arm: Secondary | ICD-10-CM

## 2019-07-29 DIAGNOSIS — M79602 Pain in left arm: Secondary | ICD-10-CM

## 2019-07-30 ENCOUNTER — Encounter: Payer: Self-pay | Admitting: Orthopedic Surgery

## 2019-07-30 NOTE — Progress Notes (Signed)
Office Visit Note   Patient: Maureen Chung           Date of Birth: April 17, 1950           MRN: [id number] Visit Date: 07/29/2019 Requested by: London Pepper, MD [address],  [place] [address] PCP: London Pepper, MD  Subjective: Chief Complaint  Patient presents with  . Neck - Pain  . Right Shoulder - Pain  . Left Shoulder - Pain    HPI: Deshelia 69 year old patient with right arm and neck pain.  Describes shooting radicular type pain involving the arm and shoulder region.  It does extend below the elbow.  The pain does not wake her from sleep at night but it does give her relatively frequent periods of discomfort.  She tried over-the-counter medicine without much relief.  Denies much in the way of mechanical symptoms in the right or left shoulder.              ROS: All systems reviewed are negative as they relate to the chief complaint within the history of present illness.  Patient denies  fevers or chills.   Assessment & Plan: Visit Diagnoses:  1. Pain in both upper extremities     Plan: Impression is right-sided radiculopathy with no discrete weakness normal shoulder exam and significant degenerative changes noted in the cervical spine.  Symptoms have been ongoing for over 2 months.  Interfering with ADLs with failure of conservative management.  Plan MRI scan with possible ESI to follow.  Follow-up after that study.  Follow-Up Instructions: Return for after MRI.   Orders:  Orders Placed This Encounter  Procedures  . XR Shoulder Right  . XR Shoulder Left  . XR Cervical Spine 2 or 3 views  . MR Cervical Spine w/o contrast   No orders of the defined types were placed in this encounter.     Procedures: No procedures performed   Clinical Data: No additional findings.  Objective: Vital Signs: There were no vitals taken for this visit.  Physical Exam:   Constitutional: Patient appears well-developed HEENT:  Head:  Normocephalic Eyes:EOM are normal Neck: Normal range of motion Cardiovascular: Normal rate Pulmonary/chest: Effort normal Neurologic: Patient is alert Skin: Skin is warm Psychiatric: Patient has normal mood and affect    Ortho Exam: Ortho exam demonstrates 10 degrees limitation of flexion extension of the cervical spine with some reproduction of symptoms with rotation to the right.  Right shoulder exam demonstrates full active and passive range of motion with no restriction of external rotation 50 degrees of abduction.  Good motor sensory function in the hand.  Radial pulse intact bilaterally.  Rotator cuff strength good on the right infraspinatus supraspinatus and subscap muscle testing.  No definite paresthesias C5-T1.  Reflexes symmetric bilateral biceps triceps 1+ out of 4.  Specialty Comments:  No specialty comments available.  Imaging: No results found.   PMFS History: Patient Active Problem List   Diagnosis Date Noted  . Abnormal findings on diagnostic imaging of lung 12/13/2018  . Healthcare maintenance 12/13/2018  . Cigarette smoker 11/16/2018  . COPD GOLD 0 still smoking 11/15/2018  . Chest pain 10/18/2015  . Hyperparathyroidism, primary ([place]) 11/27/2010   Past Medical History:  Diagnosis Date  . Anxiety   . Arthritis   . Hypercalcemia   . Hyperlipidemia   . Hyperparathyroidism   . Insomnia   . Osteoporosis   . Pericarditis     Family History  Problem Relation  Age of Onset  . Heart disease Father   . Diabetes Father   . Cancer Mother        ovarian  . Coronary artery disease Brother   . Bladder Cancer Brother   . Heart disease Brother     Past Surgical History:  Procedure Laterality Date  . BREAST CYST EXCISION  1997   benign  . CARDIAC CATHETERIZATION N/A 10/18/2015   Procedure: Left Heart Cath and Coronary Angiography;  Surgeon: Josue Hector, MD;  Location: [facility];  Service: Cardiovascular;  Laterality: N/A;  . child birth  89  .  LAPAROSCOPIC ENDOMETRIOSIS FULGURATION  1989, 1995  . PARATHYROIDECTOMY  11/01/10  . TONSILECTOMY, ADENOIDECTOMY, BILATERAL MYRINGOTOMY AND TUBES  1969  . VAGINAL HYSTERECTOMY  1981   without ovaries   Social History   Occupational History  . Not on file  Tobacco Use  . Smoking status: Current Every Day Smoker    Packs/day: 1.00    Years: 41.00    Pack years: 41.00    Types: Cigarettes  . Smokeless tobacco: Never Used  . Tobacco comment: Encouraged to develop a plan or come to me for assistance when ready to set a date.  Substance and Sexual Activity  . Alcohol use: Yes    Alcohol/week: 0.0 standard drinks    Comment: once per day  . Drug use: No  . Sexual activity: Not on file

## 2019-08-07 NOTE — Progress Notes (Signed)
Cardiology Office Note   Date:  08/17/2019   ID:  Maureen Chung, DOB April 02, 1950, MRN [id number]  PCP:  London Pepper, MD  Cardiologist:  Dr. Johnsie Cancel, MD   No chief complaint on file.     History of Present Illness: Maureen Chung is a 69 y.o. female who presents for presents for 16-month follow-up, seen for Dr.Nishan.  Maureen Chung a history oftobacco use, family history of premature CAD whowas lastseen by Dr.Nishan10/31/2018 with complaints of dyspnea, fatigue and LE edema. She also had some atypical chest pain described as sharp and fleeting with radiation to her left arm. Her dyspnea was thought to be related to her long-term tobacco use and plans for follow-up were made.  She underwent a Myoview stress test 08/31/2015 which was considered a low risk however with a small area of mild apical ischemia which showed 50 to 55% LVEFandregional wall motion abnormalities withG1 DD. There was evidence of moderate mitral valve regurgitation at that time. Follow-up cardiac cath performed 10/18/2015 with no significant CAD as below.  She wasthenseen on 10/27/2018 withcomplaints ofexertional dyspneaand fatigue thatwasconstant since1/2020. Additionally, shewas havingissues with lower extremity edema which soundedlike dependent edema possible LV dysfunction could not be excluded.  On 11/10/2018 she was seen for2-week follow-up. Her echocardiogram was performed 11/02/2018 with normal LVEF at 65%. There was no regional wall motion abnormalities and no valvular disease. BNP was noted to be markedly elevated at 1536.Given this, her p.o. Lasix was increased from 20 mg daily to 40 mg daily.  She was seen by Dr. Melvyn Novas on 11/15/2018. Shewas started on Symbicort for possible COPD.Most recent lung cancer screening CT 01/17/2019 with pulmonary nodules with the largest being 5.4 mm.  Today she she is doing well from a CV perspective.  Reports no further chest pain symptoms.   Has been follow by pulmonary medicine and has an upcoming appointment soon.  Has chronic dyspnea.  Is working on smoking cessation.  Reports she is down to 4 to 6 cigarettes/day.  She was congratulated.  BP is well controlled today and she is tolerating all of her medications without intolerances. Has not needed PRN Lasix. She denies chest pain, palpitations, PND, LE edema, dizziness or syncope.    Past Medical History:  Diagnosis Date  . Anxiety   . Arthritis   . Hypercalcemia   . Hyperlipidemia   . Hyperparathyroidism   . Insomnia   . Osteoporosis   . Pericarditis     Past Surgical History:  Procedure Laterality Date  . BREAST CYST EXCISION  1997   benign  . CARDIAC CATHETERIZATION N/A 10/18/2015   Procedure: Left Heart Cath and Coronary Angiography;  Surgeon: Josue Hector, MD;  Location: [facility];  Service: Cardiovascular;  Laterality: N/A;  . child birth  58  . LAPAROSCOPIC ENDOMETRIOSIS FULGURATION  1989, 1995  . PARATHYROIDECTOMY  11/01/10  . TONSILECTOMY, ADENOIDECTOMY, BILATERAL MYRINGOTOMY AND TUBES  1969  . VAGINAL HYSTERECTOMY  1981   without ovaries     Current Outpatient Medications  Medication Sig Dispense Refill  . albuterol (VENTOLIN HFA) 108 (90 Base) MCG/ACT inhaler Inhale 2 puffs into the lungs every 6 (six) hours as needed for wheezing or shortness of breath. 6.7 g 3  . amphetamine-dextroamphetamine (ADDERALL) 20 MG tablet Take 20 mg by mouth 2 (two) times daily.     Marland Kitchen aspirin EC 81 MG tablet Take 1 tablet (81 mg total) by mouth daily. 90 tablet 3  . atorvastatin (  LIPITOR) 10 MG tablet Take 10 mg by mouth daily.    . budesonide-formoterol (SYMBICORT) 160-4.5 MCG/ACT inhaler Inhale 2 puffs into the lungs 2 (two) times daily. 1 Inhaler 11  . Cholecalciferol (VITAMIN D3) 5000 units TABS Take 10,000 Units by mouth daily.     . clonazePAM (KLONOPIN) 0.5 MG tablet Take 0.5 mg by mouth as needed.    . furosemide (LASIX) 20 MG tablet Take 1 tablet (20 mg  total) by mouth as needed for fluid or edema. 90 tablet 3  . Magnesium Oxide (MAG-OXIDE PO) Take 1 tablet by mouth daily.    . metoprolol succinate (TOPROL-XL) 25 MG 24 hr tablet Take 25 mg by mouth daily.    . nitroGLYCERIN (NITROSTAT) 0.4 MG SL tablet Place 1 tablet (0.4 mg total) under the tongue every 5 (five) minutes as needed for chest pain. 25 tablet 3  . potassium chloride SA (K-DUR) 20 MEQ tablet Take 1 tablet (20 mEq total) by mouth daily. 90 tablet 3  . sertraline (ZOLOFT) 100 MG tablet Take 200 mg by mouth daily.     No current facility-administered medications for this visit.    Allergies:   Latex    Social History:  The patient  reports that she has been smoking cigarettes. She has a 41.00 pack-year smoking history. She has never used smokeless tobacco. She reports current alcohol use. She reports that she does not use drugs.   Family History:  The patient's family history includes Bladder Cancer in her brother; Cancer in her mother; Coronary artery disease in her brother; Diabetes in her father; Heart disease in her brother and father.    ROS:  Please see the history of present illness.   Otherwise, review of systems are positive for none. All other systems are reviewed and negative.    PHYSICAL EXAM: VS:  BP 124/66   Pulse 84   Ht 5\' 4"  (1.626 m)   Wt 128 lb (58.1 kg)   BMI 21.97 kg/m  , BMI Body mass index is 21.97 kg/m.   General: Well developed, well nourished, NAD Neck: Negative for carotid bruits. No JVD Lungs:Clear to ausculation bilaterally. No wheezes, rales, or rhonchi. Breathing is unlabored. Cardiovascular: RRR with S1 S2. No murmurs Extremities: No edema. Radial  pulses 2+ bilaterally Neuro: Alert and oriented. No focal deficits. No facial asymmetry. MAE spontaneously. Psych: Responds to questions appropriately with normal affect.     EKG:  EKG is ordered today. The ekg ordered today demonstrates NSR with HR 84bpm, LVH and no acute changes     Recent Labs: 10/27/2018: NT-Pro BNP 1,536 12/07/2018: BUN 16; Creatinine, Ser 1.25; Potassium 4.7; Sodium 142    Lipid Panel No results found for: CHOL, TRIG, HDL, CHOLHDL, VLDL, LDLCALC, LDLDIRECT    Wt Readings from Last 3 Encounters:  08/17/19 128 lb (58.1 kg)  01/24/19 123 lb (55.8 kg)  12/13/18 122 lb (55.3 kg)      Other studies Reviewed: Additional studies/ records that were reviewed today include:   Review of the above records demonstrates:    Cardiac catheterization 10/18/2015:  Prox LAD to Mid LAD lesion, 20 %stenosed.  No significant CAD Mild calcification of proximal LAD Normal EF 65%  Myoview stress test 08/31/2015:  Nuclear stress EF: 50%.  Upsloping ST segment depression ST segment depression of 1 mm was noted during stress in the V5, V6, III and aVF leads.  This is a low risk study.  The left ventricular ejection fraction is mildly decreased (  45-54%).  Findings consistent with ischemia.  Echocardiogram 08/31/2015: - Left ventricle: The cavity size was normal. Wall thickness was normal. Systolic function was normal. The estimated ejection fraction was in the range of 50% to 55%. Wall motion was normal; there were no regional wall motion abnormalities. Doppler parameters are consistent with abnormal left ventricular relaxation (grade 1 diastolic dysfunction). The E/e&' ratio is between 8-15, suggesting indeterminate LV filling pressure. - Mitral valve: Mildly thickened leaflets with elongated and redundant leaflet tips. There was mild to moderate regurgitation. - Left atrium: The atrium was normal in size. - Atrial septum: Aneurysmal IAS - cannot exclude small PFO. - Inferior vena cava: The vessel was normal in size. The respirophasic diameter changes were in the normal range (>= 50%), consistent with normal central venous pressure.  Impressions:  - LVEF 50-55%, normal wall thickness, normal wall motion,  diastolic dysfunction, indeterminate LV filling pressure, mildly thickened leaflets with elongated leaflet tips, mild to moderate mitral regurgitation, normal LA size, aneurysmal IAS - cannot exclude small PFO, normal IVC.  Small area of mild apical ischemia. ECG positive with 1 mm upsloping ST segment depression Surface images with EF 50% mild apical hypokinesis   ASSESSMENT AND PLAN:  1.Dyspnea: -Reportsimprovement in her symptoms since our last office visit.  -Followed now by pulmonary medicine who started her on Symbicort and plan for PFTs 12/13/2018.  -Continues to work on tobacco cessation>>>down to 4-6 cigarettes per day  -Continue PRN Lasix  -Labs per PCP 07/19/2019  2. HLD: -Last LDL,89>>>placed on statin per PCP  3. CKD stage II: -Stable per patient report>>Labs per PCP 07/19/19  4. Tobacco abuse: -Cessation strongly encouraged -Down to 4-6 cigarettes per day  -Underwent carotid Dopplers per PCP which were negative for stenosis -Needs low-dose chest CT without contrast recommended    Current medicines are reviewed at length with the patient today.  The patient does not have concerns regarding medicines.  The following changes have been made:  no change  Labs/ tests ordered today include: None   Orders Placed This Encounter  Procedures  . EKG 12-Lead   Disposition:   FU with Dr. Johnsie Cancel in 1 year  Signed, Kathyrn Drown, NP  08/17/2019 3:27 PM    [address], [address], [place]  [address] Phone: [phone]; Fax: [phone]

## 2019-08-17 ENCOUNTER — Encounter: Payer: Self-pay | Admitting: Cardiology

## 2019-08-17 ENCOUNTER — Other Ambulatory Visit: Payer: Self-pay

## 2019-08-17 ENCOUNTER — Ambulatory Visit (INDEPENDENT_AMBULATORY_CARE_PROVIDER_SITE_OTHER): Payer: Medicare Other | Admitting: Cardiology

## 2019-08-17 VITALS — BP 124/66 | HR 84 | Ht 64.0 in | Wt 128.0 lb

## 2019-08-17 DIAGNOSIS — J449 Chronic obstructive pulmonary disease, unspecified: Secondary | ICD-10-CM | POA: Diagnosis not present

## 2019-08-17 DIAGNOSIS — Z720 Tobacco use: Secondary | ICD-10-CM

## 2019-08-17 DIAGNOSIS — I10 Essential (primary) hypertension: Secondary | ICD-10-CM

## 2019-08-17 DIAGNOSIS — R0602 Shortness of breath: Secondary | ICD-10-CM | POA: Diagnosis not present

## 2019-08-17 NOTE — Patient Instructions (Signed)
Medication Instructions:   Your physician recommends that you continue on your current medications as directed. Please refer to the Current Medication list given to you today.  *If you need a refill on your cardiac medications before your next appointment, please call your pharmacy*  Lab Work:  None ordered today  Testing/Procedures:  None ordered today  Follow-Up: At [facility], you and your health needs are our priority.  As part of our continuing mission to provide you with exceptional heart care, we have created designated Provider Care Teams.  These Care Teams include your primary Cardiologist (physician) and Advanced Practice Providers (APPs -  Physician Assistants and Nurse Practitioners) who all work together to provide you with the care you need, when you need it.  We recommend signing up for the patient portal called "MyChart".  Sign up information is provided on this After Visit Summary.  MyChart is used to connect with patients for Virtual Visits (Telemedicine).  Patients are able to view lab/test results, encounter notes, upcoming appointments, etc.  Non-urgent messages can be sent to your provider as well.   To learn more about what you can do with MyChart, go to [email or web address].    Your next appointment:   12 month(s)  The format for your next appointment:   In Person  Provider:   You may see Jenkins Rouge, MD or one of the following Advanced Practice Providers on your designated Care Team:    Truitt Merle, NP  Cecilie Kicks, NP  Kathyrn Drown, NP

## 2019-09-02 ENCOUNTER — Other Ambulatory Visit: Payer: Self-pay

## 2019-09-02 ENCOUNTER — Ambulatory Visit
Admission: RE | Admit: 2019-09-02 | Discharge: 2019-09-02 | Disposition: A | Discharge status: Home / Self Care | Payer: Medicare Other | Source: Ambulatory Visit | Attending: Orthopedic Surgery | Admitting: Orthopedic Surgery

## 2019-09-02 DIAGNOSIS — M79602 Pain in left arm: Secondary | ICD-10-CM

## 2019-09-02 DIAGNOSIS — M79601 Pain in right arm: Secondary | ICD-10-CM

## 2019-09-07 ENCOUNTER — Encounter: Payer: Self-pay | Admitting: Orthopedic Surgery

## 2019-09-07 ENCOUNTER — Ambulatory Visit (INDEPENDENT_AMBULATORY_CARE_PROVIDER_SITE_OTHER): Payer: Medicare Other | Admitting: Orthopedic Surgery

## 2019-09-07 DIAGNOSIS — M79601 Pain in right arm: Secondary | ICD-10-CM | POA: Diagnosis not present

## 2019-09-07 NOTE — Progress Notes (Signed)
Office Visit Note   Patient: Maureen Chung           Date of Birth: 1950/05/04           MRN: [id number] Visit Date: 09/07/2019 Requested by: London Pepper, MD [address],  [place] [address] PCP: London Pepper, MD  Subjective: Chief Complaint  Patient presents with  . Follow-up    HPI: Maureen Chung is a patient with right sided pain in her arm as well as right arm numbness.  Since have seen her she has had an MRI scan.  Has a history of shoulder injection 2 years ago which helped for about 3 months.  She also has a history of osteoporosis.  MRI scan is reviewed with the patient.  She does have fairly intense marrow edema on the right-hand side at the C4-5 facet region.  She also has severe foraminal narrowing bilaterally at C3-4.  Also severe bilateral foraminal narrowing at C4-5.Severe bilateral foraminal narrowing at C5-6.  Her husband has had a lot of surgery with Dr. Earle Gell.  She reports some slight right arm weakness as well.              ROS: All systems reviewed are negative as they relate to the chief complaint within the history of present illness.  Patient denies  fevers or chills.   Assessment & Plan: Visit Diagnoses:  1. Right arm pain     Plan: Impression is right-sided radiculopathy from potentially multiple sources that C3-4 C4-5 and C5-6.  She does have slight deltoid weakness today.  Like for her to see Dr. Ernestina Patches for cervical spine ESI.  She wants to try to avoid surgery but if she needs surgery which I think she may based on her slight weakness she would like to have evaluation by Dr. Earle Gell.  That referral is made.  Follow-up with [place] as needed.  Follow-Up Instructions: No follow-ups on file.   Orders:  Orders Placed This Encounter  Procedures  . Ambulatory referral to Physical Medicine Rehab  . Ambulatory referral to Neurosurgery   No orders of the defined types were placed in this encounter.     Procedures: No  procedures performed   Clinical Data: No additional findings.  Objective: Vital Signs: There were no vitals taken for this visit.  Physical Exam:   Constitutional: Patient appears well-developed HEENT:  Head: Normocephalic Eyes:EOM are normal Neck: Normal range of motion Cardiovascular: Normal rate Pulmonary/chest: Effort normal Neurologic: Patient is alert Skin: Skin is warm Psychiatric: Patient has normal mood and affect    Ortho Exam: Ortho exam demonstrates 5 out of 5 grip EPL FPL interosseous wrist flexion extension biceps triceps strength bilaterally with 5-5 deltoid strength on the right compared to the left.  No definite paresthesias C5-T1.  Reflexes symmetric.  Radial pulse intact bilaterally.  Neck range of motion slightly tender with about 20 degrees of extension and flexion chin to chest.  No muscle atrophy in either arm.  Specialty Comments:  No specialty comments available.  Imaging: No results found.   PMFS History: Patient Active Problem List   Diagnosis Date Noted  . Abnormal findings on diagnostic imaging of lung 12/13/2018  . Healthcare maintenance 12/13/2018  . Cigarette smoker 11/16/2018  . COPD GOLD 0 still smoking 11/15/2018  . Chest pain 10/18/2015  . Hyperparathyroidism, primary ([place]) 11/27/2010   Past Medical History:  Diagnosis Date  . Anxiety   . Arthritis   . Hypercalcemia   .  Hyperlipidemia   . Hyperparathyroidism   . Insomnia   . Osteoporosis   . Pericarditis     Family History  Problem Relation Age of Onset  . Heart disease Father   . Diabetes Father   . Cancer Mother        ovarian  . Coronary artery disease Brother   . Bladder Cancer Brother   . Heart disease Brother     Past Surgical History:  Procedure Laterality Date  . BREAST CYST EXCISION  1997   benign  . CARDIAC CATHETERIZATION N/A 10/18/2015   Procedure: Left Heart Cath and Coronary Angiography;  Surgeon: Josue Hector, MD;  Location: [facility];   Service: Cardiovascular;  Laterality: N/A;  . child birth  28  . LAPAROSCOPIC ENDOMETRIOSIS FULGURATION  1989, 1995  . PARATHYROIDECTOMY  11/01/10  . TONSILECTOMY, ADENOIDECTOMY, BILATERAL MYRINGOTOMY AND TUBES  1969  . VAGINAL HYSTERECTOMY  1981   without ovaries   Social History   Occupational History  . Not on file  Tobacco Use  . Smoking status: Current Every Day Smoker    Packs/day: 1.00    Years: 41.00    Pack years: 41.00    Types: Cigarettes  . Smokeless tobacco: Never Used  . Tobacco comment: Encouraged to develop a plan or come to me for assistance when ready to set a date.  Vaping Use  . Vaping Use: Never used  Substance and Sexual Activity  . Alcohol use: Yes    Alcohol/week: 0.0 standard drinks    Comment: once per day  . Drug use: No  . Sexual activity: Not on file

## 2019-09-09 ENCOUNTER — Telehealth: Payer: Self-pay | Admitting: Physical Medicine and Rehabilitation

## 2019-09-09 NOTE — Telephone Encounter (Signed)
Patient returned your call to schedule her appointment.  CB#[phone].  Thank you.

## 2019-09-12 NOTE — Telephone Encounter (Signed)
Pt is scheduled for 10/13/19 with driver and added to cancellation list.

## 2019-10-11 DIAGNOSIS — M542 Cervicalgia: Secondary | ICD-10-CM | POA: Diagnosis not present

## 2019-10-11 DIAGNOSIS — M4712 Other spondylosis with myelopathy, cervical region: Secondary | ICD-10-CM | POA: Diagnosis not present

## 2019-10-13 ENCOUNTER — Ambulatory Visit: Payer: Self-pay

## 2019-10-13 ENCOUNTER — Other Ambulatory Visit: Payer: Self-pay

## 2019-10-13 ENCOUNTER — Encounter: Payer: Self-pay | Admitting: Physical Medicine and Rehabilitation

## 2019-10-13 ENCOUNTER — Ambulatory Visit (INDEPENDENT_AMBULATORY_CARE_PROVIDER_SITE_OTHER): Payer: Medicare Other | Admitting: Physical Medicine and Rehabilitation

## 2019-10-13 VITALS — BP 148/83 | HR 76

## 2019-10-13 DIAGNOSIS — M47812 Spondylosis without myelopathy or radiculopathy, cervical region: Secondary | ICD-10-CM

## 2019-10-13 MED ORDER — METHYLPREDNISOLONE ACETATE 80 MG/ML IJ SUSP
80.0000 mg | Freq: Once | INTRAMUSCULAR | Status: AC
Start: 2019-10-13 — End: 2019-10-13
  Administered 2019-10-13: 80 mg

## 2019-10-13 NOTE — Progress Notes (Signed)
Maureen Chung - 69 y.o. female MRN [id number]  Date of birth: 14-Aug-1950  Office Visit Note: Visit Date: 10/13/2019 PCP: London Pepper, MD Referred by: London Pepper, MD  Subjective: Chief Complaint  Patient presents with  . Right Shoulder - Pain  . Right Elbow - Pain  . Neck - Pain   HPI:  Maureen Chung is a 69 y.o. female who comes in today at the request of Dr. Anderson Malta for planned Right C4-5 Cervical facet/medial branch block with fluoroscopic guidance.  The patient has failed conservative care including home exercise, medications, time and activity modification.  This injection will be diagnostic and hopefully therapeutic.  Please see requesting physician notes for further details and justification.  Exam shows concordant low neck pain with facet joint loading and extension.  MRI reviewed with images and spine model.  MRI reviewed in the note below.  If no relief would consider C7-T1 interlaminar epidural injeciton.  ROS Otherwise per HPI.  Assessment & Plan: Visit Diagnoses:  1. Cervical spondylosis without myelopathy     Plan: No additional findings.   Meds & Orders:  Meds ordered this encounter  Medications  . methylPREDNISolone acetate (DEPO-MEDROL) injection 80 mg    Orders Placed This Encounter  Procedures  . Facet Injection  . XR C-ARM NO REPORT    Follow-up: Return if symptoms worsen or fail to improve.   Procedures: No procedures performed  Cervical Facet Joint Intra-Articular Injection with Fluoroscopic Guidance  Patient: Maureen Chung      Date of Birth: 1950-07-30 MRN: [id number] PCP: London Pepper, MD      Visit Date: 10/13/2019   Universal Protocol:    Date/Time: 10/12/2108:17 AM  Consent Given By: the patient  Position: lateral  Additional Comments: Vital signs were monitored before and after the procedure. Patient was prepped and draped in the usual sterile fashion. The correct patient, procedure, and site was  verified.   Injection Procedure Details:  Procedure Site One Meds Administered:  Meds ordered this encounter  Medications  . methylPREDNISolone acetate (DEPO-MEDROL) injection 80 mg     Laterality: Right  Location/Site:  C4-5  Needle size: 25 G  Needle type: Standard  Needle Placement: Articular  Findings:  -Contrast Used: 0.5 mL iohexol 180 mg iodine/mL   -Comments: Excellent flow of contrast producing a partial arthrogram.  Procedure Details: The fluoroscope beam was manipulated to achieve the best "true" lateral view possible by squaring off the endplates with cranial and caudal tilt and using varying obliquity to achieve the a view with the longest length of spinous process.   The region overlying the facet joints mentioned above were then localized under fluoroscopic visualization. For each target described below the skin was anesthetized with 1 ml of 1% Lidocaine without epinephrine. The needle was inserted down to the level of the lateral mass of the superior articular process of the  facet joint to be injected. Then, the needle was "walked off" inferiorly into the lateral aspect of the facet joint. Bi-planar images were used for confirming placement and spot radiographs were documented. Radiographs were obtained of the arthrogram. A 0.5 ml. volume of the steroid/anesthetic solution was injected into the joint. This procedure was repeated for each facet joint injected.   Additional Comments:  The patient tolerated the procedure well Dressing: Band-Aid    Post-procedure details: Patient was observed during the procedure. Post-procedure instructions were reviewed.  Patient left the clinic in stable condition.     Clinical  History: MRI CERVICAL SPINE WITHOUT CONTRAST  TECHNIQUE: Multiplanar, multisequence MR imaging of the cervical spine was performed. No intravenous contrast was administered.  COMPARISON:  Plain film cervical spine  07/29/2019.  FINDINGS: Alignment: There is straightening of the normal cervical lordosis. Trace anterolisthesis C4 on C5.  Vertebrae: No fracture, evidence of discitis, or bone lesion. Degenerative endplate signal change is worst at C5-6 and C6-7.  Cord: Normal signal throughout.  Posterior Fossa, vertebral arteries, paraspinal tissues: Negative.  Disc levels:  C2-3: Facet arthropathy causes mild right foraminal narrowing. There is a shallow central protrusion but the central canal is widely patent. The left foramen is also widely patent.  C3-4: Shallow disc bulge and left worse than right uncovertebral disease. Moderately severe to severe foraminal narrowing is worse on the left. The central canal is open.  C4-5: There is right much worse than left facet degenerative disease. Intense marrow edema is present in the right facets. Disc osteophyte complex and right worse than left uncovertebral disease. The ventral thecal sac is effaced. Severe bilateral foraminal narrowing is present.  C5-6: Disc osteophyte complex and bilateral uncovertebral disease. There is slight flattening of the ventral cord and severe bilateral foraminal narrowing.  C6-7: Shallow disc bulge and left worse than right uncovertebral disease. The ventral thecal sac is effaced. Severe left and moderate right foraminal narrowing.  C7-T1: Left worse than right facet degenerative change. No disc bulge or protrusion. No stenosis.  IMPRESSION: Given history of posterior neck pain on the right, particular note is made of intense marrow edema in the right C4-5 facets which can be a source of neck pain. Inflammation of the facets could also irritate the exiting right C5 root.  Moderately severe to severe bilateral foraminal narrowing at C3-4 is worse on the left.  Severe bilateral foraminal narrowing at C4-5 where the ventral thecal sac is effaced.  Slight flattening of the ventral cord and  severe bilateral foraminal narrowing at C5-6.  Severe left and moderate right foraminal narrowing at C6-7 where the ventral thecal sac is effaced.   Electronically Signed   By: Inge Rise M.D.   On: 09/02/2019 16:17     Objective:  VS:  HT:    WT:   BMI:     BP:(!) 148/83  HR:76bpm  TEMP: ( )  RESP:  Physical Exam Vitals and nursing note reviewed.  Constitutional:      General: She is not in acute distress.    Appearance: Normal appearance. She is not ill-appearing.  HENT:     Head: Normocephalic and atraumatic.     Right Ear: External ear normal.     Left Ear: External ear normal.  Eyes:     Extraocular Movements: Extraocular movements intact.  Cardiovascular:     Rate and Rhythm: Normal rate.     Pulses: Normal pulses.  Musculoskeletal:     Cervical back: Tenderness present. No rigidity.     Right lower leg: No edema.     Left lower leg: No edema.     Comments: Patient has good strength in the upper extremities including 5 out of 5 strength in wrist extension long finger flexion and APB.  There is no atrophy of the hands intrinsically.  There is a negative Hoffmann's test.   Lymphadenopathy:     Cervical: No cervical adenopathy.  Skin:    Findings: No erythema, lesion or rash.  Neurological:     General: No focal deficit present.     Mental Status: She  is alert and oriented to person, place, and time.     Sensory: No sensory deficit.     Motor: No weakness or abnormal muscle tone.     Coordination: Coordination normal.  Psychiatric:        Mood and Affect: Mood normal.        Behavior: Behavior normal.      Imaging: No results found.

## 2019-10-13 NOTE — Procedures (Signed)
Cervical Facet Joint Intra-Articular Injection with Fluoroscopic Guidance  Patient: Maureen Chung      Date of Birth: May 28, 1950 MRN: [id number] PCP: London Pepper, MD      Visit Date: 10/13/2019   Universal Protocol:    Date/Time: 10/12/2108:17 AM  Consent Given By: the patient  Position: lateral  Additional Comments: Vital signs were monitored before and after the procedure. Patient was prepped and draped in the usual sterile fashion. The correct patient, procedure, and site was verified.   Injection Procedure Details:  Procedure Site One Meds Administered:  Meds ordered this encounter  Medications  . methylPREDNISolone acetate (DEPO-MEDROL) injection 80 mg     Laterality: Right  Location/Site:  C4-5  Needle size: 25 G  Needle type: Standard  Needle Placement: Articular  Findings:  -Contrast Used: 0.5 mL iohexol 180 mg iodine/mL   -Comments: Excellent flow of contrast producing a partial arthrogram.  Procedure Details: The fluoroscope beam was manipulated to achieve the best "true" lateral view possible by squaring off the endplates with cranial and caudal tilt and using varying obliquity to achieve the a view with the longest length of spinous process.   The region overlying the facet joints mentioned above were then localized under fluoroscopic visualization. For each target described below the skin was anesthetized with 1 ml of 1% Lidocaine without epinephrine. The needle was inserted down to the level of the lateral mass of the superior articular process of the  facet joint to be injected. Then, the needle was "walked off" inferiorly into the lateral aspect of the facet joint. Bi-planar images were used for confirming placement and spot radiographs were documented. Radiographs were obtained of the arthrogram. A 0.5 ml. volume of the steroid/anesthetic solution was injected into the joint. This procedure was repeated for each facet joint  injected.   Additional Comments:  The patient tolerated the procedure well Dressing: Band-Aid    Post-procedure details: Patient was observed during the procedure. Post-procedure instructions were reviewed.  Patient left the clinic in stable condition.

## 2019-10-13 NOTE — Progress Notes (Signed)
Pt states Neck pain that travel to her shoulder to her elbow. Pt states [place] and working out in the [place] makes the pain worse. Pt state laying down help with some of the pain.    Numeric Pain Rating Scale and Functional Assessment Average Pain 2   In the last MONTH (on 0-10 scale) has pain interfered with the following?  1. General activity like being  able to carry out your everyday physical activities such as walking, climbing stairs, carrying groceries, or moving a chair?  Rating(6)   +Driver, -BT, -Dye Allergies.

## 2019-10-20 ENCOUNTER — Other Ambulatory Visit: Payer: Self-pay | Admitting: Physical Medicine and Rehabilitation

## 2019-10-20 ENCOUNTER — Telehealth: Payer: Self-pay

## 2019-10-20 MED ORDER — BACLOFEN 10 MG PO TABS: ORAL_TABLET | ORAL | 1 refills | Status: DC

## 2019-10-20 NOTE — Telephone Encounter (Signed)
Ok on the follow up with Dr. Marlou Sa, medication sent in and note in chart.

## 2019-10-20 NOTE — Telephone Encounter (Signed)
Called pt. Pt states that the inj didn't work and she will make an f/u appt with Dr. Marlou Sa. Pt state would also like to try muscle relaxer and have it sent to her pharmacy.

## 2019-10-20 NOTE — Telephone Encounter (Signed)
Pt called and complained that her neck, right arm and shoulder are in pain. Pt states that she over did it with lifting boxes and clean. She has had tylenol and it helped the pain a little. She would like of Dr Ernestina Patches give her a call.

## 2019-10-20 NOTE — Progress Notes (Signed)
See phone messages from today. Baclofen sent in for neck pain and spasm. Facet block not helpful. Epidural suggested but patient wanted to follow with Dr. Marlou Sa.

## 2019-10-20 NOTE — Telephone Encounter (Signed)
I reviewed images of recent injection and all looks good with no issues at all. If pain was better after injection but returned I would use ICE 25min/20min off and we could try a muscle relaxer and get her back in for 2nd injection in 2 weeks. If the the shot did not help much then would still do the ice and maybe muscle relaxer and schedule for possible cervical epidural in 2 weeks.  She can also f/up with Dr. Marlou Sa if she needs to.

## 2019-10-25 DIAGNOSIS — F909 Attention-deficit hyperactivity disorder, unspecified type: Secondary | ICD-10-CM | POA: Diagnosis not present

## 2019-10-25 DIAGNOSIS — M810 Age-related osteoporosis without current pathological fracture: Secondary | ICD-10-CM | POA: Diagnosis not present

## 2019-10-25 DIAGNOSIS — Z720 Tobacco use: Secondary | ICD-10-CM | POA: Diagnosis not present

## 2019-10-25 DIAGNOSIS — M542 Cervicalgia: Secondary | ICD-10-CM | POA: Diagnosis not present

## 2019-10-25 DIAGNOSIS — E785 Hyperlipidemia, unspecified: Secondary | ICD-10-CM | POA: Diagnosis not present

## 2019-10-27 ENCOUNTER — Ambulatory Visit: Payer: Self-pay

## 2019-10-27 ENCOUNTER — Ambulatory Visit (INDEPENDENT_AMBULATORY_CARE_PROVIDER_SITE_OTHER): Payer: Medicare Other | Admitting: Physical Medicine and Rehabilitation

## 2019-10-27 ENCOUNTER — Other Ambulatory Visit: Payer: Self-pay

## 2019-10-27 ENCOUNTER — Encounter: Payer: Self-pay | Admitting: Physical Medicine and Rehabilitation

## 2019-10-27 VITALS — BP 130/68 | HR 70

## 2019-10-27 DIAGNOSIS — M5412 Radiculopathy, cervical region: Secondary | ICD-10-CM

## 2019-10-27 MED ORDER — METHYLPREDNISOLONE ACETATE 80 MG/ML IJ SUSP
80.0000 mg | Freq: Once | INTRAMUSCULAR | Status: AC
  Administered 2019-10-27: 80 mg

## 2019-10-27 NOTE — Progress Notes (Signed)
Maureen Chung - 69 y.o. female MRN [id number]  Date of birth: Nov 03, 1950  Office Visit Note: Visit Date: 10/27/2019 PCP: London Pepper, MD Referred by: London Pepper, MD  Subjective: Chief Complaint  Patient presents with  . Neck - Pain  . Right Shoulder - Pain   HPI:  Maureen Chung is a 69 y.o. female who comes in today at the request of Dr. Anderson Malta for planned Right C7-T1 Cervical epidural steroid injection with fluoroscopic guidance.  The patient has failed conservative care including home exercise, medications, time and activity modification.  This injection will be diagnostic and hopefully therapeutic.  Please see requesting physician notes for further details and justification.  Diagnostic cervical facet block was not very beneficial.  See our prior notes  ROS Otherwise per HPI.  Assessment & Plan: Visit Diagnoses:  1. Cervical radiculopathy     Plan: No additional findings.   Meds & Orders:  Meds ordered this encounter  Medications  . methylPREDNISolone acetate (DEPO-MEDROL) injection 80 mg    Orders Placed This Encounter  Procedures  . XR C-ARM NO REPORT  . Epidural Steroid injection    Follow-up: Return for visit to requesting physician as needed.   Procedures: No procedures performed  Cervical Epidural Steroid Injection - Interlaminar Approach with Fluoroscopic Guidance  Patient: Maureen Chung      Date of Birth: 17-Mar-1951 MRN: [id number] PCP: London Pepper, MD      Visit Date: 10/27/2019   Universal Protocol:    Date/Time: 08/12/211:05 PM  Consent Given By: the patient  Position: PRONE  Additional Comments: Vital signs were monitored before and after the procedure. Patient was prepped and draped in the usual sterile fashion. The correct patient, procedure, and site was verified.   Injection Procedure Details:  Procedure Site One Meds Administered:  Meds ordered this encounter  Medications  . methylPREDNISolone acetate  (DEPO-MEDROL) injection 80 mg     Laterality: Right  Location/Site: C7-T1  Needle size: 20 G  Needle type: Touhy  Needle Placement: Paramedian epidural space  Findings:  -Comments: Excellent flow of contrast into the epidural space.  Procedure Details: Using a paramedian approach from the side mentioned above, the region overlying the inferior lamina was localized under fluoroscopic visualization and the soft tissues overlying this structure were infiltrated with 4 ml. of 1% Lidocaine without Epinephrine. A # 20 gauge, Tuohy needle was inserted into the epidural space using a paramedian approach.  The epidural space was localized using loss of resistance along with lateral and contralateral oblique bi-planar fluoroscopic views.  After negative aspirate for air, blood, and CSF, a 2 ml. volume of Isovue-250 was injected into the epidural space and the flow of contrast was observed. Radiographs were obtained for documentation purposes.   The injectate was administered into the level noted above.  Additional Comments:  The patient tolerated the procedure well Dressing: 2 x 2 sterile gauze and Band-Aid    Post-procedure details: Patient was observed during the procedure. Post-procedure instructions were reviewed.  Patient left the clinic in stable condition.     Clinical History: MRI CERVICAL SPINE WITHOUT CONTRAST  TECHNIQUE: Multiplanar, multisequence MR imaging of the cervical spine was performed. No intravenous contrast was administered.  COMPARISON:  Plain film cervical spine 07/29/2019.  FINDINGS: Alignment: There is straightening of the normal cervical lordosis. Trace anterolisthesis C4 on C5.  Vertebrae: No fracture, evidence of discitis, or bone lesion. Degenerative endplate signal change is worst at C5-6 and C6-7.  Cord: Normal signal throughout.  Posterior Fossa, vertebral arteries, paraspinal tissues: Negative.  Disc levels:  C2-3: Facet  arthropathy causes mild right foraminal narrowing. There is a shallow central protrusion but the central canal is widely patent. The left foramen is also widely patent.  C3-4: Shallow disc bulge and left worse than right uncovertebral disease. Moderately severe to severe foraminal narrowing is worse on the left. The central canal is open.  C4-5: There is right much worse than left facet degenerative disease. Intense marrow edema is present in the right facets. Disc osteophyte complex and right worse than left uncovertebral disease. The ventral thecal sac is effaced. Severe bilateral foraminal narrowing is present.  C5-6: Disc osteophyte complex and bilateral uncovertebral disease. There is slight flattening of the ventral cord and severe bilateral foraminal narrowing.  C6-7: Shallow disc bulge and left worse than right uncovertebral disease. The ventral thecal sac is effaced. Severe left and moderate right foraminal narrowing.  C7-T1: Left worse than right facet degenerative change. No disc bulge or protrusion. No stenosis.  IMPRESSION: Given history of posterior neck pain on the right, particular note is made of intense marrow edema in the right C4-5 facets which can be a source of neck pain. Inflammation of the facets could also irritate the exiting right C5 root.  Moderately severe to severe bilateral foraminal narrowing at C3-4 is worse on the left.  Severe bilateral foraminal narrowing at C4-5 where the ventral thecal sac is effaced.  Slight flattening of the ventral cord and severe bilateral foraminal narrowing at C5-6.  Severe left and moderate right foraminal narrowing at C6-7 where the ventral thecal sac is effaced.   Electronically Signed   By: Inge Rise M.D.   On: 09/02/2019 16:17     Objective:  VS:  HT:    WT:   BMI:     BP:130/68  HR:70bpm  TEMP: ( )  RESP:  Physical Exam Vitals and nursing note reviewed.  Constitutional:       General: She is not in acute distress.    Appearance: Normal appearance. She is not ill-appearing.  HENT:     Head: Normocephalic and atraumatic.     Right Ear: External ear normal.     Left Ear: External ear normal.  Eyes:     Extraocular Movements: Extraocular movements intact.  Cardiovascular:     Rate and Rhythm: Normal rate.     Pulses: Normal pulses.  Musculoskeletal:     Cervical back: Tenderness present. No rigidity.     Right lower leg: No edema.     Left lower leg: No edema.     Comments: Patient has good strength in the upper extremities including 5 out of 5 strength in wrist extension long finger flexion and APB.  There is no atrophy of the hands intrinsically.  There is a negative Hoffmann's test.   Lymphadenopathy:     Cervical: No cervical adenopathy.  Skin:    Findings: No erythema, lesion or rash.  Neurological:     General: No focal deficit present.     Mental Status: She is alert and oriented to person, place, and time.     Sensory: No sensory deficit.     Motor: No weakness or abnormal muscle tone.     Coordination: Coordination normal.  Psychiatric:        Mood and Affect: Mood normal.        Behavior: Behavior normal.      Imaging: No results found.

## 2019-10-27 NOTE — Procedures (Signed)
Cervical Epidural Steroid Injection - Interlaminar Approach with Fluoroscopic Guidance  Patient: Maureen Chung      Date of Birth: 10/22/1950 MRN: [id number] PCP: London Pepper, MD      Visit Date: 10/27/2019   Universal Protocol:    Date/Time: 08/12/211:05 PM  Consent Given By: the patient  Position: PRONE  Additional Comments: Vital signs were monitored before and after the procedure. Patient was prepped and draped in the usual sterile fashion. The correct patient, procedure, and site was verified.   Injection Procedure Details:  Procedure Site One Meds Administered:  Meds ordered this encounter  Medications  . methylPREDNISolone acetate (DEPO-MEDROL) injection 80 mg     Laterality: Right  Location/Site: C7-T1  Needle size: 20 G  Needle type: Touhy  Needle Placement: Paramedian epidural space  Findings:  -Comments: Excellent flow of contrast into the epidural space.  Procedure Details: Using a paramedian approach from the side mentioned above, the region overlying the inferior lamina was localized under fluoroscopic visualization and the soft tissues overlying this structure were infiltrated with 4 ml. of 1% Lidocaine without Epinephrine. A # 20 gauge, Tuohy needle was inserted into the epidural space using a paramedian approach.  The epidural space was localized using loss of resistance along with lateral and contralateral oblique bi-planar fluoroscopic views.  After negative aspirate for air, blood, and CSF, a 2 ml. volume of Isovue-250 was injected into the epidural space and the flow of contrast was observed. Radiographs were obtained for documentation purposes.   The injectate was administered into the level noted above.  Additional Comments:  The patient tolerated the procedure well Dressing: 2 x 2 sterile gauze and Band-Aid    Post-procedure details: Patient was observed during the procedure. Post-procedure instructions were reviewed.  Patient left  the clinic in stable condition.

## 2019-10-27 NOTE — Progress Notes (Signed)
Reports that cervical facet injections did not really help. Did heavy lifting on Wednesday and pain increased. Right shoulder and arm pain and pressure; numbness and tingling. Numeric Pain Rating Scale and Functional Assessment Average Pain 3   In the last MONTH (on 0-10 scale) has pain interfered with the following?  1. General activity like being  able to carry out your everyday physical activities such as walking, climbing stairs, carrying groceries, or moving a chair?  Rating(9)   +Driver, -BT, -Dye Allergies.

## 2019-11-02 ENCOUNTER — Ambulatory Visit (INDEPENDENT_AMBULATORY_CARE_PROVIDER_SITE_OTHER): Payer: Medicare Other | Admitting: Orthopedic Surgery

## 2019-11-02 DIAGNOSIS — M79601 Pain in right arm: Secondary | ICD-10-CM

## 2019-11-02 MED ORDER — CYCLOBENZAPRINE HCL 10 MG PO TABS
10.0000 mg | ORAL_TABLET | Freq: Three times a day (TID) | ORAL | 0 refills | Status: DC | PRN
Start: 2019-11-02 — End: 2019-12-05

## 2019-11-05 ENCOUNTER — Encounter: Payer: Self-pay | Admitting: Orthopedic Surgery

## 2019-11-05 NOTE — Progress Notes (Signed)
Office Visit Note   Patient: Maureen Chung           Date of Birth: Dec 24, 1950           MRN: [id number] Visit Date: 11/02/2019 Requested by: London Pepper, MD [address],  [place] [address] PCP: London Pepper, MD  Subjective: Chief Complaint  Patient presents with  . Neck - Pain    HPI: Maureen Chung is a 69 year old patient with neck and arm pain.  Had cervical ESI with Dr. Ernestina Patches 10/27/2019.  Did get some relief.  The pain has improved some.  States that she has rested some over the last week.  Dr. Arnoldo Morale recommending fusion.  Denies any weakness and no left-sided symptoms.  She likes to garden.  Takes Tylenol for her pain.  She has had an MRI scan that shows C3-4 C4-5 and C5-6 nerve compression.  She is somewhat hesitant about pursuing surgery at this time.              ROS: All systems reviewed are negative as they relate to the chief complaint within the history of present illness.  Patient denies  fevers or chills.   Assessment & Plan: Visit Diagnoses:  1. Right arm pain     Plan: Impression is cervical spine ESI a week ago with some improvement in symptoms.  She wants to hold off on surgery for now.  Flexeril prescribed per patient request.  Her weakness is actually little bit better today.  I would favor surgical evaluation when symptoms return.  That would be with Dr. Arnoldo Morale.  Follow-up with [place] as needed.  I think it is likely that [name] will need intervention in the future but for now she has somewhat of a reprieve.  Follow-Up Instructions: Return if symptoms worsen or fail to improve.   Orders:  No orders of the defined types were placed in this encounter.  Meds ordered this encounter  Medications  . cyclobenzaprine (FLEXERIL) 10 MG tablet    Sig: Take 1 tablet (10 mg total) by mouth 3 (three) times daily as needed for muscle spasms.    Dispense:  30 tablet    Refill:  0      Procedures: No procedures performed   Clinical  Data: No additional findings.  Objective: Vital Signs: There were no vitals taken for this visit.  Physical Exam:   Constitutional: Patient appears well-developed HEENT:  Head: Normocephalic Eyes:EOM are normal Neck: Normal range of motion Cardiovascular: Normal rate Pulmonary/chest: Effort normal Neurologic: Patient is alert Skin: Skin is warm Psychiatric: Patient has normal mood and affect    Ortho Exam: Ortho exam demonstrates good rotator cuff strength and deltoid strength bilaterally with no real asymmetry.  Grip strength also symmetric.  No muscle atrophy right arm versus left arm.  Neck range of motion mildly painful but generally intact with flexion extension rotation.  No definite paresthesias today C5-T1.  Bilaterally.  Specialty Comments:  No specialty comments available.  Imaging: No results found.   PMFS History: Patient Active Problem List   Diagnosis Date Noted  . Abnormal findings on diagnostic imaging of lung 12/13/2018  . Healthcare maintenance 12/13/2018  . Cigarette smoker 11/16/2018  . COPD GOLD 0 still smoking 11/15/2018  . Chest pain 10/18/2015  . Hyperparathyroidism, primary ([place]) 11/27/2010   Past Medical History:  Diagnosis Date  . Anxiety   . Arthritis   . Hypercalcemia   . Hyperlipidemia   . Hyperparathyroidism   .  Insomnia   . Osteoporosis   . Pericarditis     Family History  Problem Relation Age of Onset  . Heart disease Father   . Diabetes Father   . Cancer Mother        ovarian  . Coronary artery disease Brother   . Bladder Cancer Brother   . Heart disease Brother     Past Surgical History:  Procedure Laterality Date  . BREAST CYST EXCISION  1997   benign  . CARDIAC CATHETERIZATION N/A 10/18/2015   Procedure: Left Heart Cath and Coronary Angiography;  Surgeon: Josue Hector, MD;  Location: [facility];  Service: Cardiovascular;  Laterality: N/A;  . child birth  73  . LAPAROSCOPIC ENDOMETRIOSIS FULGURATION   1989, 1995  . PARATHYROIDECTOMY  11/01/10  . TONSILECTOMY, ADENOIDECTOMY, BILATERAL MYRINGOTOMY AND TUBES  1969  . VAGINAL HYSTERECTOMY  1981   without ovaries   Social History   Occupational History  . Not on file  Tobacco Use  . Smoking status: Current Every Day Smoker    Packs/day: 1.00    Years: 41.00    Pack years: 41.00    Types: Cigarettes  . Smokeless tobacco: Never Used  . Tobacco comment: Encouraged to develop a plan or come to me for assistance when ready to set a date.  Vaping Use  . Vaping Use: Never used  Substance and Sexual Activity  . Alcohol use: Yes    Alcohol/week: 0.0 standard drinks    Comment: once per day  . Drug use: No  . Sexual activity: Not on file

## 2019-11-22 ENCOUNTER — Other Ambulatory Visit: Payer: Self-pay | Admitting: Internal Medicine

## 2019-12-05 ENCOUNTER — Other Ambulatory Visit: Payer: Self-pay | Admitting: Orthopedic Surgery

## 2019-12-05 NOTE — Telephone Encounter (Signed)
Please advise. Thanks.  

## 2019-12-15 DIAGNOSIS — Z23 Encounter for immunization: Secondary | ICD-10-CM | POA: Diagnosis not present

## 2019-12-26 ENCOUNTER — Other Ambulatory Visit: Payer: Self-pay | Admitting: Internal Medicine

## 2019-12-27 ENCOUNTER — Other Ambulatory Visit: Payer: Self-pay | Admitting: Internal Medicine

## 2019-12-27 DIAGNOSIS — D631 Anemia in chronic kidney disease: Secondary | ICD-10-CM | POA: Diagnosis not present

## 2019-12-27 DIAGNOSIS — E785 Hyperlipidemia, unspecified: Secondary | ICD-10-CM | POA: Diagnosis not present

## 2019-12-27 DIAGNOSIS — F909 Attention-deficit hyperactivity disorder, unspecified type: Secondary | ICD-10-CM | POA: Diagnosis not present

## 2019-12-27 DIAGNOSIS — R011 Cardiac murmur, unspecified: Secondary | ICD-10-CM | POA: Diagnosis not present

## 2019-12-27 DIAGNOSIS — I129 Hypertensive chronic kidney disease with stage 1 through stage 4 chronic kidney disease, or unspecified chronic kidney disease: Secondary | ICD-10-CM | POA: Diagnosis not present

## 2019-12-27 DIAGNOSIS — I2510 Atherosclerotic heart disease of native coronary artery without angina pectoris: Secondary | ICD-10-CM | POA: Diagnosis not present

## 2019-12-27 DIAGNOSIS — R799 Abnormal finding of blood chemistry, unspecified: Secondary | ICD-10-CM | POA: Diagnosis not present

## 2019-12-27 DIAGNOSIS — E210 Primary hyperparathyroidism: Secondary | ICD-10-CM | POA: Diagnosis not present

## 2019-12-27 DIAGNOSIS — N1831 Chronic kidney disease, stage 3a: Secondary | ICD-10-CM | POA: Diagnosis not present

## 2019-12-27 DIAGNOSIS — Z720 Tobacco use: Secondary | ICD-10-CM | POA: Diagnosis not present

## 2019-12-28 ENCOUNTER — Other Ambulatory Visit: Payer: Self-pay | Admitting: Internal Medicine

## 2020-01-06 DIAGNOSIS — F418 Other specified anxiety disorders: Secondary | ICD-10-CM | POA: Diagnosis not present

## 2020-01-06 DIAGNOSIS — E785 Hyperlipidemia, unspecified: Secondary | ICD-10-CM | POA: Diagnosis not present

## 2020-01-06 DIAGNOSIS — R1110 Vomiting, unspecified: Secondary | ICD-10-CM | POA: Diagnosis not present

## 2020-01-06 DIAGNOSIS — F909 Attention-deficit hyperactivity disorder, unspecified type: Secondary | ICD-10-CM | POA: Diagnosis not present

## 2020-01-11 ENCOUNTER — Other Ambulatory Visit: Payer: Self-pay | Admitting: Internal Medicine

## 2020-01-11 DIAGNOSIS — I129 Hypertensive chronic kidney disease with stage 1 through stage 4 chronic kidney disease, or unspecified chronic kidney disease: Secondary | ICD-10-CM

## 2020-01-11 DIAGNOSIS — R112 Nausea with vomiting, unspecified: Secondary | ICD-10-CM | POA: Diagnosis not present

## 2020-01-11 DIAGNOSIS — N1831 Chronic kidney disease, stage 3a: Secondary | ICD-10-CM

## 2020-01-12 ENCOUNTER — Other Ambulatory Visit: Payer: Self-pay | Admitting: Physician Assistant

## 2020-01-12 DIAGNOSIS — Z23 Encounter for immunization: Secondary | ICD-10-CM | POA: Diagnosis not present

## 2020-01-12 DIAGNOSIS — R112 Nausea with vomiting, unspecified: Secondary | ICD-10-CM

## 2020-01-17 ENCOUNTER — Telehealth: Payer: Self-pay | Admitting: Physical Medicine and Rehabilitation

## 2020-01-17 DIAGNOSIS — M4712 Other spondylosis with myelopathy, cervical region: Secondary | ICD-10-CM | POA: Diagnosis not present

## 2020-01-17 DIAGNOSIS — M4312 Spondylolisthesis, cervical region: Secondary | ICD-10-CM | POA: Diagnosis not present

## 2020-01-17 NOTE — Telephone Encounter (Signed)
ok 

## 2020-01-17 NOTE — Telephone Encounter (Signed)
Patient had right C7-T1 IL on 8/12. Ok to repeat if helped, same problem/side, and no new injury?

## 2020-01-17 NOTE — Telephone Encounter (Signed)
Patient called requesting to schedule an appt. Please call patient at [phone].

## 2020-01-18 ENCOUNTER — Telehealth: Payer: Self-pay | Admitting: Physical Medicine and Rehabilitation

## 2020-01-18 NOTE — Telephone Encounter (Signed)
See previous message

## 2020-01-18 NOTE — Telephone Encounter (Signed)
Patient called returning a call to [place]. Please call patient at [phone].

## 2020-01-18 NOTE — Telephone Encounter (Signed)
Left message #1

## 2020-01-18 NOTE — Telephone Encounter (Signed)
Scheduled for 11/29 at 0845 with driver and no blood thinners.

## 2020-01-27 DIAGNOSIS — Z1159 Encounter for screening for other viral diseases: Secondary | ICD-10-CM | POA: Diagnosis not present

## 2020-01-31 ENCOUNTER — Ambulatory Visit
Admission: RE | Admit: 2020-01-31 | Discharge: 2020-01-31 | Disposition: A | Discharge status: Home / Self Care | Payer: Medicare Other | Source: Ambulatory Visit | Attending: Physician Assistant | Admitting: Physician Assistant

## 2020-01-31 DIAGNOSIS — K7689 Other specified diseases of liver: Secondary | ICD-10-CM | POA: Diagnosis not present

## 2020-01-31 DIAGNOSIS — N200 Calculus of kidney: Secondary | ICD-10-CM | POA: Diagnosis not present

## 2020-01-31 DIAGNOSIS — R112 Nausea with vomiting, unspecified: Secondary | ICD-10-CM

## 2020-02-01 DIAGNOSIS — R112 Nausea with vomiting, unspecified: Secondary | ICD-10-CM | POA: Diagnosis not present

## 2020-02-01 DIAGNOSIS — K449 Diaphragmatic hernia without obstruction or gangrene: Secondary | ICD-10-CM | POA: Diagnosis not present

## 2020-02-01 DIAGNOSIS — K2930 Chronic superficial gastritis without bleeding: Secondary | ICD-10-CM | POA: Diagnosis not present

## 2020-02-01 DIAGNOSIS — K222 Esophageal obstruction: Secondary | ICD-10-CM | POA: Diagnosis not present

## 2020-02-01 DIAGNOSIS — K2980 Duodenitis without bleeding: Secondary | ICD-10-CM | POA: Diagnosis not present

## 2020-02-01 DIAGNOSIS — K3189 Other diseases of stomach and duodenum: Secondary | ICD-10-CM | POA: Diagnosis not present

## 2020-02-02 ENCOUNTER — Telehealth: Payer: Self-pay | Admitting: Internal Medicine

## 2020-02-02 NOTE — Telephone Encounter (Signed)
Called and spoke with patient to let her know that since she has an appointment with Wyn Quaker tomorrow she can request that her medication be refilled at that time. There is a note in her chart stating that she needs an OV before she can get refills, to assure that she keeps her appointment I advised her to ask at her OV tomorrow. She expressed understanding. Nothing further needed at this time.

## 2020-02-03 ENCOUNTER — Other Ambulatory Visit: Payer: Self-pay | Admitting: Pulmonary Disease

## 2020-02-03 ENCOUNTER — Other Ambulatory Visit: Payer: Self-pay

## 2020-02-03 ENCOUNTER — Encounter: Payer: Self-pay | Admitting: Pulmonary Disease

## 2020-02-03 ENCOUNTER — Ambulatory Visit (INDEPENDENT_AMBULATORY_CARE_PROVIDER_SITE_OTHER): Payer: Medicare Other | Admitting: Pulmonary Disease

## 2020-02-03 VITALS — BP 118/76 | HR 66 | Temp 97.3°F | Ht 64.0 in | Wt 129.4 lb

## 2020-02-03 DIAGNOSIS — Z0000 Encounter for general adult medical examination without abnormal findings: Secondary | ICD-10-CM

## 2020-02-03 DIAGNOSIS — J449 Chronic obstructive pulmonary disease, unspecified: Secondary | ICD-10-CM

## 2020-02-03 DIAGNOSIS — Z87891 Personal history of nicotine dependence: Secondary | ICD-10-CM

## 2020-02-03 MED ORDER — BUDESONIDE-FORMOTEROL FUMARATE 160-4.5 MCG/ACT IN AERO
2.0000 | INHALATION_SPRAY | Freq: Two times a day (BID) | RESPIRATORY_TRACT | 4 refills | Status: DC
Start: 2020-02-03 — End: 2023-02-23

## 2020-02-03 NOTE — Assessment & Plan Note (Signed)
Congratulated patient on stopping smoking Up-to-date with vaccinations

## 2020-02-03 NOTE — Assessment & Plan Note (Signed)
Congratulated patient on stopping smoking Stop smoking August/2021  Plan: Continue to not smoke Up-to-date with vaccinations Patient needs lung cancer screening CT scheduled

## 2020-02-03 NOTE — Assessment & Plan Note (Signed)
Plan: Continue Symbicort 160 Follow-up with our office in 3 months Up-to-date with vaccinations We will send message to lung cancer screening team to get patient scheduled for follow-up CT Continue to not smoke

## 2020-02-03 NOTE — Progress Notes (Signed)
@Patient  ID: Maureen Chung, female    DOB: April 24, 1950, 69 y.o.   MRN: [id number]  Chief Complaint  Patient presents with  . Follow-up    pt needs refill on symbicort    Referring provider: London Pepper, MD  HPI:  69 year old female current everyday smoker followed in our office for COPD  PMH: Hyperparathyroidism Smoker/ Smoking History: Former Smoker.  Quit Aug/2021.  41-pack-year smoking history Maintenance: Symbicort 160 Pt of: Dr. Melvyn Novas  02/03/2020  - Visit   69 year old female current everyday smoker followed in our office for COPD.  Patient was last seen in our office in November/2020.Marland Kitchen  Plan of care from that office visit was as follows: Follow-up in 4 weeks, work on stopping smoking, complete lung cancer screening CT in November/2021, continue Symbicort 160.  Patient did meet with our clinical pharmacy team in December/2020 to work on stopping smoking.  Patient presenting back to her office today reporting that she quit smoking in August/2021.  She continues continuing to have issues with nausea and vomiting.  She is currently working with [facility].  She recently completed a endoscopy on 02/01/2020 with the stated impressions of: Nonobstructing Schatzki ring, 8 cm hiatal hernia, erosive gastropathy with no bleeding and no stigmata of recent bleeding, biopsied, duodenitis-biopsied.  She is awaiting the results from [facility] gastroenterology.  She has follow-ups with them.  She is maintaining Protonix.  She needs refills of Symbicort 160 today.  She feels that her breathing has improved since stopping smoking.  She is up-to-date with her flu vaccine and COVID-19 vaccinations.  She needs her lung cancer screening CT that was planned for this month, this was never scheduled.  Questionaires / Pulmonary Flowsheets:   ACT:  No flowsheet data found.  MMRC: mMRC Dyspnea Scale mMRC Score  01/24/2019 1  12/13/2018 1    Epworth:  No flowsheet data found.  Tests:    12/09/2018-SARS-CoV-2-negative  10/27/2018-proBNP-1536  11/16/2018-chest x-ray-no active cardiopulmonary disease  10/09/2015-CBC with differential- eosinophils absolute 495  01/13/2017-CT chest lung cancer screening-lung RADS 2S, minimal centrilobular and paraseptal emphysema, new subpleural pulmonary nodule in peripheral left lower lobe measuring 3.8 mm in volume benign appearance of behavior, follow-up CT in 12 months  11/02/2018- echocardiogram-LV ejection fraction greater than 65%, right ventricle normal systolic function  95/[id number]/9326-[id number] chest lung cancer screening-lung RADS 2, emphysema, lateral right lower lobe pulmonary nodule 5.3 mm, new nodules identified within the right lower lobe which is 3.5 mm   FENO:  No results found for: NITRICOXIDE  PFT: PFT Results Latest Ref Rng & Units 12/13/2018  FVC-Pre L 2.59  FVC-Predicted Pre % 82  FVC-Post L 2.57  FVC-Predicted Post % 82  Pre FEV1/FVC % % 69  Post FEV1/FCV % % 72  FEV1-Pre L 1.80  FEV1-Predicted Pre % 75  FEV1-Post L 1.86  DLCO uncorrected ml/min/mmHg 14.51  DLCO UNC% % 73  DLVA Predicted % 81  TLC L 4.50  TLC % Predicted % 88  RV % Predicted % 91    WALK:  SIX MIN WALK 12/13/2018  Supplimental Oxygen during Test? (L/min) No  Tech Comments: pt tolerated walk well.    Imaging: US Abdomen Complete  Result Date: 01/31/2020 CLINICAL DATA:  Intractable nausea and vomiting EXAM: ABDOMEN ULTRASOUND COMPLETE COMPARISON:  No abdominal comparison is. FINDINGS: Gallbladder: No gallstones or wall thickening visualized. No sonographic Murphy sign noted by sonographer. Common bile duct: Diameter: 5.6 mm Liver: Mildly heterogeneous hepatic echotexture with slight increased echogenicity. No focal lesion on  submitted images. Portal vein is patent on color Doppler imaging with normal direction of blood flow towards the liver. IVC: No abnormality visualized. Pancreas: Visualized portion unremarkable. Spleen: Size and appearance within  normal limits. Right Kidney: Length: 8.6 cm. Increased cortical echogenicity with diminished corticomedullary differentiation and cortical thinning. Largest calculus 0.6 cm. Shadowing suggests multiple calculi and perhaps even some medullary nephrocalcinosis. Left Kidney: Length: 9.9 cm. Cortical thinning and decreased corticomedullary differentiation of the LEFT kidney. Also some suggestion of increased medullary echogenicity on the LEFT. Abdominal aorta: No aneurysm visualized. Other findings: None. IMPRESSION: 1. Mildly increased hepatic echotexture may indicate mild hepatic steatosis. No acute biliary pathology by ultrasound. 2. Cortical thinning of the bilateral kidneys worse on the RIGHT with RIGHT renal calculus and potential medullary nephrocalcinosis. 3. Multiple small RIGHT renal calculi largest approximately 6 mm. Electronically Signed   By: Zetta Bills M.D.   On: 01/31/2020 16:45    Lab Results:  CBC    Component Value Date/Time   WBC 9.9 10/09/2015 1017   RBC 3.89 10/09/2015 1017   HGB 12.2 10/09/2015 1017   HCT 37.4 10/09/2015 1017   PLT 248 10/09/2015 1017   MCV 96.1 10/09/2015 1017   MCH 31.4 10/09/2015 1017   MCHC 32.6 10/09/2015 1017   RDW 15.3 (H) 10/09/2015 1017   LYMPHSABS 2,574 10/09/2015 1017   MONOABS 1,188 (H) 10/09/2015 1017   EOSABS 495 10/09/2015 1017   BASOSABS 99 10/09/2015 1017    BMET    Component Value Date/Time   NA 142 12/07/2018 1409   K 4.7 12/07/2018 1409   CL 105 12/07/2018 1409   CO2 23 12/07/2018 1409   GLUCOSE 80 12/07/2018 1409   GLUCOSE 93 10/18/2015 0945   BUN 16 12/07/2018 1409   CREATININE 1.25 (H) 12/07/2018 1409   CALCIUM 9.6 12/07/2018 1409   CALCIUM 10.0 01/03/2011 1504   GFRNONAA 45 (L) 12/07/2018 1409   GFRAA 52 (L) 12/07/2018 1409    BNP    Component Value Date/Time   BNP 31.4 10/09/2015 1017    ProBNP    Component Value Date/Time   PROBNP 1,536 (H) 10/27/2018 1416    Specialty Problems      Pulmonary  Problems   COPD GOLD 0      - .11/15/2018  After extensive coaching inhaler device,  effectiveness =    75% try symbicort 160 2bid  10/09/2015-CBC with differential- eosinophils absolute 495 12/13/2018-pulmonary function test-FVC 2.59 (82% predicted), postbronchodilator ratio 72, postbronchodilator FEV1 1.86 (77% predicted), no bronchodilator response, DLCO 14.51 (73% predicted), Concavity on flow volume loops> continue symbicort  QUIT SMOKING Aug/2021         Allergies  Allergen Reactions  . Latex Rash    Immunization History  Administered Date(s) Administered  . Fluad Quad(high Dose 65+) 12/13/2018  . Influenza-Unspecified 12/27/2019  . PFIZER SARS-COV-2 Vaccination 04/06/2019, 04/27/2019, 11/25/2019  . Pneumococcal Conjugate-13 12/15/2017  . Zoster Recombinat (Shingrix) 04/13/2018, 11/04/2018    Past Medical History:  Diagnosis Date  . Anxiety   . Arthritis   . Hypercalcemia   . Hyperlipidemia   . Hyperparathyroidism   . Insomnia   . Osteoporosis   . Pericarditis     Tobacco History: Social History   Tobacco Use  Smoking Status Former Smoker  . Packs/day: 1.00  . Years: 41.00  . Pack years: 41.00  . Types: Cigarettes  . Quit date: 11/12/2019  . Years since quitting: 0.2  Smokeless Tobacco Never Used  Tobacco Comment  Encouraged to develop a plan or come to me for assistance when ready to set a date.   Counseling given: Yes Comment: Encouraged to develop a plan or come to me for assistance when ready to set a date.   Continue to not smoke  Outpatient Encounter Medications as of 02/03/2020  Medication Sig  . albuterol (VENTOLIN HFA) 108 (90 Base) MCG/ACT inhaler Inhale 2 puffs into the lungs every 6 (six) hours as needed for wheezing or shortness of breath.  . amphetamine-dextroamphetamine (ADDERALL) 20 MG tablet Take 20 mg by mouth 2 (two) times daily.   Marland Kitchen atorvastatin (LIPITOR) 10 MG tablet Take 10 mg by mouth daily.  . baclofen (LIORESAL) 10 MG tablet  Take 1/2 to 1 by mouth every 8hrs as needed for spasm  . cyclobenzaprine (FLEXERIL) 10 MG tablet TAKE 1 TABLET THREE TIMES DAILY AS NEEDED FOR MUSCLE SPASMS.  . Magnesium Oxide (MAG-OXIDE PO) Take 1 tablet by mouth daily.  . metoprolol succinate (TOPROL-XL) 25 MG 24 hr tablet Take 25 mg by mouth daily.  . nitroGLYCERIN (NITROSTAT) 0.4 MG SL tablet Place 1 tablet (0.4 mg total) under the tongue every 5 (five) minutes as needed for chest pain.  . pantoprazole (PROTONIX) 40 MG tablet Take 40 mg by mouth daily.  . potassium chloride SA (K-DUR) 20 MEQ tablet Take 1 tablet (20 mEq total) by mouth daily.  . sertraline (ZOLOFT) 100 MG tablet Take 200 mg by mouth daily.  . [DISCONTINUED] aspirin EC 81 MG tablet Take 1 tablet (81 mg total) by mouth daily.  . [DISCONTINUED] Cholecalciferol (VITAMIN D3) 5000 units TABS Take 10,000 Units by mouth daily.   . [DISCONTINUED] clonazePAM (KLONOPIN) 0.5 MG tablet Take 0.5 mg by mouth as needed.  . [DISCONTINUED] SYMBICORT 160-4.5 MCG/ACT inhaler INHALE 2 PUFFS INTO THE LUNGS TWICE DAILY  . furosemide (LASIX) 20 MG tablet Take 1 tablet (20 mg total) by mouth as needed for fluid or edema.   No facility-administered encounter medications on file as of 02/03/2020.     Review of Systems  Review of Systems  Constitutional: Negative for activity change, fatigue and fever.  HENT: Negative for sinus pressure, sinus pain and sore throat.   Respiratory: Negative for cough, shortness of breath and wheezing.   Cardiovascular: Negative for chest pain and palpitations.  Gastrointestinal: Negative for diarrhea, nausea and vomiting.  Musculoskeletal: Negative for arthralgias.  Neurological: Negative for dizziness.  Psychiatric/Behavioral: Negative for sleep disturbance. The patient is not nervous/anxious.      Physical Exam  BP 118/76 (BP Location: Left Arm, Cuff Size: Normal)   Pulse 66   Temp (!) 97.3 F (36.3 C) (Oral)   Ht 5\' 4"  (1.626 m)   Wt 129 lb 6.4 oz  (58.7 kg)   SpO2 98%   BMI 22.21 kg/m   Wt Readings from Last 5 Encounters:  02/03/20 129 lb 6.4 oz (58.7 kg)  08/17/19 128 lb (58.1 kg)  01/24/19 123 lb (55.8 kg)  12/13/18 122 lb (55.3 kg)  12/07/18 123 lb 6.4 oz (56 kg)    BMI Readings from Last 5 Encounters:  02/03/20 22.21 kg/m  08/17/19 21.97 kg/m  01/24/19 21.11 kg/m  12/13/18 20.94 kg/m  12/07/18 20.85 kg/m     Physical Exam Vitals and nursing note reviewed.  Constitutional:      General: She is not in acute distress.    Appearance: Normal appearance. She is normal weight.  HENT:     Head: Normocephalic and atraumatic.  Right Ear: External ear normal.     Left Ear: External ear normal.     Nose: Nose normal. No congestion.     Mouth/Throat:     Mouth: Mucous membranes are moist.     Pharynx: Oropharynx is clear.  Eyes:     Pupils: Pupils are equal, round, and reactive to light.  Cardiovascular:     Rate and Rhythm: Normal rate and regular rhythm.     Pulses: Normal pulses.     Heart sounds: Normal heart sounds. No murmur heard.   Pulmonary:     Effort: Pulmonary effort is normal. No respiratory distress.     Breath sounds: Normal breath sounds. No decreased air movement. No decreased breath sounds, wheezing or rales.  Musculoskeletal:     Cervical back: Normal range of motion.  Skin:    General: Skin is warm and dry.     Capillary Refill: Capillary refill takes less than 2 seconds.  Neurological:     General: No focal deficit present.     Mental Status: She is alert and oriented to person, place, and time. Mental status is at baseline.     Gait: Gait normal.  Psychiatric:        Mood and Affect: Mood normal.        Behavior: Behavior normal.        Thought Content: Thought content normal.        Judgment: Judgment normal.       Assessment & Plan:   COPD GOLD 0  Plan: Continue Symbicort 160 Follow-up with our office in 3 months Up-to-date with vaccinations We will send message to  lung cancer screening team to get patient scheduled for follow-up CT Continue to not smoke  Former smoker Congratulated patient on stopping smoking Stop smoking August/2021  Plan: Continue to not smoke Up-to-date with vaccinations Patient needs lung cancer screening CT scheduled  Healthcare maintenance Congratulated patient on stopping smoking Up-to-date with vaccinations    Return in about 3 months (around 05/05/2020), or if symptoms worsen or fail to improve, for Follow up with Dr. Melvyn Novas.   Lauraine Rinne, NP 02/03/2020   This appointment required 25 minutes of patient care (this includes precharting, chart review, review of results, face-to-face care, etc.).

## 2020-02-03 NOTE — Patient Instructions (Addendum)
You were seen today by Lauraine Rinne, NP  for:   It was nice seeing you today.  Congratulations on stopping smoking!  We are proud of you!  We will keep you on your inhalers.  We will see you back in 3 months with an appointment with Dr. Melvyn Novas.  Have a safe and happy holidays,  Aaron Edelman  1. COPD GOLD 0   Continue Symbicort 160 >>> 2 puffs in the morning right when you wake up, rinse out your mouth after use, 12 hours later 2 puffs, rinse after use >>> Take this daily, no matter what >>> This is not a rescue inhaler   Note your daily symptoms > remember "red flags" for COPD:   >>>Increase in cough >>>increase in sputum production >>>increase in shortness of breath or activity  intolerance.   If you notice these symptoms, please call the office to be seen.   2. Former smoker  Congrats on stopping smoking!   We need to get you scheduled for your lung cancer screening   We recommend that you stop smoking.  >>>You need to set a quit date >>>If you have friends or family who smoke, let them know you are trying to quit and not to smoke around you or in your living environment  Smoking Cessation Resources:  1 800 QUIT NOW  >>> Patient to call this resource and utilize it to help support her quit smoking >>> Keep up your hard work with stopping smoking  You can also contact the [facility] >>>For smoking cessation classes call [phone]  We do not recommend using e-cigarettes as a form of stopping smoking  You can sign up for smoking cessation support texts and information:  >>>https://smokefree.gov/smokefreetxt    3. Healthcare maintenance  Up to date with covid19 and flu vaccines    Follow Up:    Return in about 3 months (around 05/05/2020), or if symptoms worsen or fail to improve, for Follow up with Dr. Melvyn Novas.   Notification of test results are managed in the following manner: If there are  any recommendations or changes to the  plan of care discussed  in office today,  we will contact you and let you know what they are. If you do not hear from [place], then your results are normal and you can view them through your  MyChart account , or a letter will be sent to you. Thank you again for trusting [place] with your care  - Thank you, [facility]    It is flu season:   >>> Best ways to protect herself from the flu: Receive the yearly flu vaccine, practice good hand hygiene washing with soap and also using hand sanitizer when available, eat a nutritious meals, get adequate rest, hydrate appropriately       Please contact the office if your symptoms worsen or you have concerns that you are not improving.   Thank you for choosing [facility] for your healthcare, and for allowing [place] to partner with you on your healthcare journey. I am thankful to be able to provide care to you today.   Wyn Quaker FNP-C

## 2020-02-06 DIAGNOSIS — K2980 Duodenitis without bleeding: Secondary | ICD-10-CM | POA: Diagnosis not present

## 2020-02-06 DIAGNOSIS — G4700 Insomnia, unspecified: Secondary | ICD-10-CM | POA: Diagnosis not present

## 2020-02-06 DIAGNOSIS — R112 Nausea with vomiting, unspecified: Secondary | ICD-10-CM | POA: Diagnosis not present

## 2020-02-06 DIAGNOSIS — I10 Essential (primary) hypertension: Secondary | ICD-10-CM | POA: Diagnosis not present

## 2020-02-06 DIAGNOSIS — F418 Other specified anxiety disorders: Secondary | ICD-10-CM | POA: Diagnosis not present

## 2020-02-06 DIAGNOSIS — K2930 Chronic superficial gastritis without bleeding: Secondary | ICD-10-CM | POA: Diagnosis not present

## 2020-02-06 DIAGNOSIS — F909 Attention-deficit hyperactivity disorder, unspecified type: Secondary | ICD-10-CM | POA: Diagnosis not present

## 2020-02-07 ENCOUNTER — Other Ambulatory Visit: Payer: Self-pay | Admitting: Acute Care

## 2020-02-07 DIAGNOSIS — Z720 Tobacco use: Secondary | ICD-10-CM

## 2020-02-08 ENCOUNTER — Other Ambulatory Visit: Payer: Self-pay | Admitting: Neurosurgery

## 2020-02-08 ENCOUNTER — Other Ambulatory Visit: Payer: Self-pay | Admitting: *Deleted

## 2020-02-08 DIAGNOSIS — Z87891 Personal history of nicotine dependence: Secondary | ICD-10-CM

## 2020-02-08 DIAGNOSIS — F17210 Nicotine dependence, cigarettes, uncomplicated: Secondary | ICD-10-CM

## 2020-02-13 ENCOUNTER — Other Ambulatory Visit: Payer: Self-pay | Admitting: Physical Medicine and Rehabilitation

## 2020-02-13 ENCOUNTER — Telehealth: Payer: Self-pay

## 2020-02-13 ENCOUNTER — Ambulatory Visit: Payer: Medicare Other | Admitting: Physical Medicine and Rehabilitation

## 2020-02-13 ENCOUNTER — Other Ambulatory Visit: Payer: Self-pay

## 2020-02-13 MED ORDER — CYCLOBENZAPRINE HCL 10 MG PO TABS
ORAL_TABLET | ORAL | 0 refills | Status: DC
Start: 2020-02-13 — End: 2020-03-05

## 2020-02-13 MED ORDER — BACLOFEN 10 MG PO TABS: ORAL_TABLET | ORAL | 1 refills | Status: AC

## 2020-02-13 NOTE — Telephone Encounter (Signed)
Baclofen last prescribed 10/20/2019. Please advise.

## 2020-02-13 NOTE — Telephone Encounter (Signed)
Patient came in wanting to cancel her appt , says she is having surgery in January didn't want to get an infection ?!? ..  She also asked about getting muscle relaxer refilled.

## 2020-02-13 NOTE — Telephone Encounter (Signed)
Left message to advise

## 2020-02-13 NOTE — Progress Notes (Signed)
Rx for refill of flexeril and baclofen

## 2020-02-13 NOTE — Telephone Encounter (Signed)
Refilled both flexeril and baclofen, she can fill whichever works, do not take at same time

## 2020-02-15 DIAGNOSIS — M810 Age-related osteoporosis without current pathological fracture: Secondary | ICD-10-CM | POA: Diagnosis not present

## 2020-02-15 DIAGNOSIS — R5383 Other fatigue: Secondary | ICD-10-CM | POA: Diagnosis not present

## 2020-02-22 DIAGNOSIS — H401131 Primary open-angle glaucoma, bilateral, mild stage: Secondary | ICD-10-CM | POA: Diagnosis not present

## 2020-02-22 DIAGNOSIS — H25811 Combined forms of age-related cataract, right eye: Secondary | ICD-10-CM | POA: Diagnosis not present

## 2020-02-22 DIAGNOSIS — H401130 Primary open-angle glaucoma, bilateral, stage unspecified: Secondary | ICD-10-CM | POA: Diagnosis not present

## 2020-02-22 DIAGNOSIS — H2512 Age-related nuclear cataract, left eye: Secondary | ICD-10-CM | POA: Diagnosis not present

## 2020-02-23 DIAGNOSIS — L820 Inflamed seborrheic keratosis: Secondary | ICD-10-CM | POA: Diagnosis not present

## 2020-02-23 DIAGNOSIS — L710 Perioral dermatitis: Secondary | ICD-10-CM | POA: Diagnosis not present

## 2020-02-23 DIAGNOSIS — L2084 Intrinsic (allergic) eczema: Secondary | ICD-10-CM | POA: Diagnosis not present

## 2020-02-23 DIAGNOSIS — L718 Other rosacea: Secondary | ICD-10-CM | POA: Diagnosis not present

## 2020-02-23 DIAGNOSIS — D225 Melanocytic nevi of trunk: Secondary | ICD-10-CM | POA: Diagnosis not present

## 2020-02-24 ENCOUNTER — Telehealth: Payer: Self-pay | Admitting: Internal Medicine

## 2020-02-24 NOTE — Telephone Encounter (Signed)
No reason to hold the symbiocort -  in fact should call take the morning of the surgery

## 2020-02-24 NOTE — Telephone Encounter (Signed)
ATC patient Maureen Chung  Patient diagnosed with Glaucoma. Having surgery on neck 03/30/2019. Would like to know if she needs to stop taking Symbicort inhaler prior to surgery. Patient phone number is [phone]  Please advise

## 2020-02-24 NOTE — Telephone Encounter (Signed)
Called and spoke with pt's husband letting him know the info stated by Dr. Melvyn Novas about pt's symbicort inhaler and he verbalized understanding. Nothing further needed.

## 2020-02-27 DIAGNOSIS — H353131 Nonexudative age-related macular degeneration, bilateral, early dry stage: Secondary | ICD-10-CM | POA: Diagnosis not present

## 2020-02-27 DIAGNOSIS — H401131 Primary open-angle glaucoma, bilateral, mild stage: Secondary | ICD-10-CM | POA: Diagnosis not present

## 2020-02-27 DIAGNOSIS — H2513 Age-related nuclear cataract, bilateral: Secondary | ICD-10-CM | POA: Diagnosis not present

## 2020-03-05 ENCOUNTER — Other Ambulatory Visit: Payer: Self-pay | Admitting: Physical Medicine and Rehabilitation

## 2020-03-05 NOTE — Telephone Encounter (Signed)
Please advise 

## 2020-03-19 ENCOUNTER — Telehealth: Payer: Self-pay | Admitting: Acute Care

## 2020-03-19 ENCOUNTER — Encounter: Payer: Self-pay | Admitting: Acute Care

## 2020-03-19 NOTE — Telephone Encounter (Signed)
Opened in errow

## 2020-03-23 ENCOUNTER — Ambulatory Visit (INDEPENDENT_AMBULATORY_CARE_PROVIDER_SITE_OTHER): Payer: Medicare Other | Admitting: Internal Medicine

## 2020-03-23 ENCOUNTER — Telehealth: Payer: Self-pay | Admitting: Acute Care

## 2020-03-23 ENCOUNTER — Encounter: Payer: Self-pay | Admitting: Internal Medicine

## 2020-03-23 ENCOUNTER — Other Ambulatory Visit: Payer: Self-pay

## 2020-03-23 DIAGNOSIS — Z87891 Personal history of nicotine dependence: Secondary | ICD-10-CM

## 2020-03-23 DIAGNOSIS — J449 Chronic obstructive pulmonary disease, unspecified: Secondary | ICD-10-CM | POA: Diagnosis not present

## 2020-03-23 NOTE — Assessment & Plan Note (Signed)
Former smoker 1 pack/day 41-pack-year smoking history 2018 cancer screening CT-lung RADS 2S Quit aug/2021  Low-dose CT lung cancer screening is recommended for patients who are 21-69 years of age with a 30+ pack-year history of smoking, and who are currently smoking or quit <=15 years ago. >> continue yearly screening per Antoine Primas         Each maintenance medication was reviewed in detail including emphasizing most importantly the difference between maintenance and prns and under what circumstances the prns are to be triggered using an action plan format where appropriate.  Total time for H and P, chart review, counseling, teaching device and generating customized AVS unique to this office visit / charting =  25 min

## 2020-03-23 NOTE — Assessment & Plan Note (Addendum)
QUIT SMOKING Aug/2021 -  11/15/2018  After extensive coaching inhaler device,  effectiveness =    75% try symbicort 160 2bid  10/09/2015-CBC with differential- eosinophils absolute 495 12/13/2018-pulmonary function test-FVC 2.59 (82% predicted), postbronchodilator ratio 72, postbronchodilator FEV1 1.86 (77% predicted), no bronchodilator response, DLCO 14.51 (73% predicted), Concavity on flow volume loops> continue symbicort  - 03/23/2020 ok to use symbicort 160 prn and pulmonary f/u prn   - The proper method of use, as well as anticipated side effects, of a metered-dose inhaler were discussed and demonstrated to the patient using teach back method and an empty symbicort cannister for practice   All goals of chronic asthma control met including optimal function and elimination of symptoms with minimal need for rescue therapy.  Contingencies discussed in full including contacting this office immediately if not controlling the symptoms using the rule of two's.     Ok to change symbicort 160 to prn Based on two studies from NEJM  378; 20 p 1865 (2018) and 380 : p2020-30 (2019) in pts with mild asthma it is reasonable to use symbicort eg 160 2bid "prn" flare in this setting but I emphasized this was only shown with symbicort and takes advantage of the rapid onset of action but is not the same as "rescue therapy" but can be stopped once the acute symptoms have resolved and the need for rescue has been minimized for a week  (< 2 x weekly)

## 2020-03-23 NOTE — Patient Instructions (Addendum)
No change in medications except the symbicort 160  is semi-optional : up to 2 puffs every 12 hours until you are 100% then slow taper as tolerated    If you are satisfied with your treatment plan,  let your doctor know and he/she can either refill your medications or you can return here when your prescription runs out.     If in any way you are not 100% satisfied,  please tell us.  If 100% better, tell your friends!  Pulmonary follow up is as needed

## 2020-03-23 NOTE — Progress Notes (Signed)
Maureen Chung, female    DOB: 22-Apr-1950    MRN: [id number]   Brief patient profile:  53 yowf  quit smoking 10/2019  healthy childhood very active with nasal congestion / drippy nose spring and fall onset in early 90s then more cough/ sob since 01/2016 lost ground since never tried inhaler rx referred to pulmonary clinic 11/15/2018 by  Kathyrn Drown NP    History of Present Illness  11/15/2018  Pulmonary/ 1st office eval/[facility]  Chief Complaint  Patient presents with  . Pulmonary Consult    Referred by Kathyrn Drown, NP.  Pt c/o cough, SOB and fatigue since Nov 2017.   Her cough is prod with yellow sputum. She states she gets SOB while gardening and walking outside.   Dyspnea:  MMRC1 = can walk nl pace, flat grade, can't hurry or go uphills or steps s sob   Cough: rattle with slt yellow mucus esp after supper  Sleep: able to lie flat /1 pillow / worse congestion in am SABA use: never  Husband also smokes rec Symbicort 160 Take 2 puffs first thing in am and then another 2 puffs about 12 hours later.  Work on inhaler technique:  relax and gently blow all the way out then take a nice smooth deep breath back in, triggering the inhaler at same time you start breathing in.  Hold for up to 5 seconds if you can. Blow out thru nose. Rinse and gargle with water when done The key is to stop smoking completely before smoking completely stops you!     03/23/2020  f/u ov/Cleatis Fandrich re: COPD GOLD 0 with AB component/ no longer smoking   Chief Complaint  Patient presents with  . Follow-up    Patient feels good overall, states cough has pretty much disappeared, and breathing is better since last visit.  Dyspnea:  Limited by neck pain / planning for surgery in March 2022 Cough: none Sleeping: flat bed, one pillow/ less am congestion  SABA use:not needing   02: none    No obvious day to day or daytime variability or assoc excess/ purulent sputum or mucus plugs or hemoptysis or cp or chest tightness,  subjective wheeze or overt sinus or hb symptoms.   Sleeping  without nocturnal  or early am exacerbation  of respiratory  c/o's or need for noct saba. Also denies any obvious fluctuation of symptoms with weather or environmental changes or other aggravating or alleviating factors except as outlined above   No unusual exposure hx or h/o childhood pna/ asthma or knowledge of premature birth.  Current Allergies, Complete Past Medical History, Past Surgical History, Family History, and Social History were reviewed in [employer] record.  ROS  The following are not active complaints unless bolded Hoarseness, sore throat, dysphagia, dental problems, itching, sneezing,  nasal congestion or discharge of excess mucus or purulent secretions, ear ache,   fever, chills, sweats, unintended wt loss or wt gain, classically pleuritic or exertional cp,  orthopnea pnd or arm/hand swelling  or leg swelling, presyncope, palpitations, abdominal pain, anorexia, nausea, vomiting, diarrhea  or change in bowel habits or change in bladder habits, change in stools or change in urine, dysuria, hematuria,  rash, arthralgias, visual complaints, headache, numbness, weakness or ataxia or problems with walking or coordination,  change in mood or  memory.        Current Meds  Medication Sig  . albuterol (VENTOLIN HFA) 108 (90 Base) MCG/ACT inhaler Inhale 2 puffs into  the lungs every 6 (six) hours as needed for wheezing or shortness of breath.  . amphetamine-dextroamphetamine (ADDERALL) 20 MG tablet Take 20 mg by mouth 2 (two) times daily.   Marland Kitchen atorvastatin (LIPITOR) 10 MG tablet Take 10 mg by mouth daily.  . baclofen (LIORESAL) 10 MG tablet Take 1/2 to 1 by mouth every 8hrs as needed for spasm  . Bimatoprost (LUMIGAN OP) Apply 1 drop to eye at bedtime. Both eyes  . budesonide-formoterol (SYMBICORT) 160-4.5 MCG/ACT inhaler Inhale 2 puffs into the lungs 2 (two) times daily.  . cyclobenzaprine (FLEXERIL) 10 MG  tablet TAKE 1 TABLET THREE TIMES DAILY AS NEEDED FOR MUSCLE SPASMS.  . furosemide (LASIX) 20 MG tablet Take 1 tablet (20 mg total) by mouth as needed for fluid or edema.  . Magnesium Oxide (MAG-OXIDE PO) Take 1 tablet by mouth daily.  . metoprolol succinate (TOPROL-XL) 25 MG 24 hr tablet Take 25 mg by mouth daily.  . nitroGLYCERIN (NITROSTAT) 0.4 MG SL tablet Place 1 tablet (0.4 mg total) under the tongue every 5 (five) minutes as needed for chest pain.  . pantoprazole (PROTONIX) 40 MG tablet Take 40 mg by mouth daily.  . potassium chloride SA (K-DUR) 20 MEQ tablet Take 1 tablet (20 mEq total) by mouth daily.  . sertraline (ZOLOFT) 100 MG tablet Take 200 mg by mouth daily.               Past Medical History:  Diagnosis Date  . Anxiety   . Arthritis   . Hypercalcemia   . Hyperlipidemia   . Hyperparathyroidism   . Insomnia   . Osteoporosis   . Pericarditis         Objective:     Wt Readings from Last 3 Encounters:  03/23/20 130 lb 3.2 oz (59.1 kg)  02/03/20 129 lb 6.4 oz (58.7 kg)  08/17/19 128 lb (58.1 kg)     Vital signs reviewed  03/23/2020  - Note at rest 02 sats  98% on RA   General appearance:    Robust pleasant amb wf nad       HEENT : pt wearing mask not removed for exam due to covid -19 concerns.    NECK :  without JVD/Nodes/TM/ nl carotid upstrokes bilaterally   LUNGS: no acc muscle use,  Nl contour chest which is clear to A and P bilaterally without cough on insp or exp maneuvers   CV:  RRR  no s3 or murmur or increase in P2, and no edema   ABD:  soft and nontender with nl inspiratory excursion in the supine position. No bruits or organomegaly appreciated, bowel sounds nl  MS:  Nl gait/ ext warm without deformities, calf tenderness, cyanosis or clubbing No obvious joint restrictions   SKIN: warm and dry without lesions    NEURO:  alert, approp, nl sensorium with  no motor or cerebellar deficits apparent.             Assessment

## 2020-03-23 NOTE — Telephone Encounter (Signed)
I have left a message for Maureen Chung to call me back to schedule her Ct

## 2020-03-27 DIAGNOSIS — J069 Acute upper respiratory infection, unspecified: Secondary | ICD-10-CM | POA: Diagnosis not present

## 2020-03-28 DIAGNOSIS — J069 Acute upper respiratory infection, unspecified: Secondary | ICD-10-CM | POA: Diagnosis not present

## 2020-03-30 NOTE — Telephone Encounter (Signed)
I have attempted to contact Maureen Chung again today to schedule her LCS CT left my number [phone]

## 2020-04-17 NOTE — Telephone Encounter (Signed)
Rodena Piety, please advise if this encounter can be closed. Thanks.

## 2020-04-18 NOTE — Telephone Encounter (Signed)
I have left another message for the patient to return my call to schedule this CT. I am going to close this message

## 2020-04-26 DIAGNOSIS — H401111 Primary open-angle glaucoma, right eye, mild stage: Secondary | ICD-10-CM | POA: Diagnosis not present

## 2020-04-26 DIAGNOSIS — H2511 Age-related nuclear cataract, right eye: Secondary | ICD-10-CM | POA: Diagnosis not present

## 2020-05-02 DIAGNOSIS — K219 Gastro-esophageal reflux disease without esophagitis: Secondary | ICD-10-CM | POA: Diagnosis not present

## 2020-05-02 DIAGNOSIS — K259 Gastric ulcer, unspecified as acute or chronic, without hemorrhage or perforation: Secondary | ICD-10-CM | POA: Diagnosis not present

## 2020-05-02 DIAGNOSIS — K449 Diaphragmatic hernia without obstruction or gangrene: Secondary | ICD-10-CM | POA: Diagnosis not present

## 2020-05-02 DIAGNOSIS — R112 Nausea with vomiting, unspecified: Secondary | ICD-10-CM | POA: Diagnosis not present

## 2020-05-06 DIAGNOSIS — H2512 Age-related nuclear cataract, left eye: Secondary | ICD-10-CM | POA: Diagnosis not present

## 2020-05-08 DIAGNOSIS — D485 Neoplasm of uncertain behavior of skin: Secondary | ICD-10-CM | POA: Diagnosis not present

## 2020-05-08 DIAGNOSIS — L718 Other rosacea: Secondary | ICD-10-CM | POA: Diagnosis not present

## 2020-05-08 DIAGNOSIS — L301 Dyshidrosis [pompholyx]: Secondary | ICD-10-CM | POA: Diagnosis not present

## 2020-05-08 DIAGNOSIS — L720 Epidermal cyst: Secondary | ICD-10-CM | POA: Diagnosis not present

## 2020-05-08 DIAGNOSIS — C44722 Squamous cell carcinoma of skin of right lower limb, including hip: Secondary | ICD-10-CM | POA: Diagnosis not present

## 2020-05-10 DIAGNOSIS — H401123 Primary open-angle glaucoma, left eye, severe stage: Secondary | ICD-10-CM | POA: Diagnosis not present

## 2020-05-10 DIAGNOSIS — H2512 Age-related nuclear cataract, left eye: Secondary | ICD-10-CM | POA: Diagnosis not present

## 2020-05-13 ENCOUNTER — Emergency Department (HOSPITAL_COMMUNITY): Payer: Medicare Other

## 2020-05-13 ENCOUNTER — Emergency Department (HOSPITAL_COMMUNITY)
Admission: EM | Admit: 2020-05-13 | Discharge: 2020-05-13 | Disposition: A | Discharge status: Home / Self Care | Payer: Medicare Other | Attending: Emergency Medicine | Admitting: Emergency Medicine

## 2020-05-13 ENCOUNTER — Other Ambulatory Visit: Payer: Self-pay

## 2020-05-13 DIAGNOSIS — R531 Weakness: Secondary | ICD-10-CM | POA: Diagnosis not present

## 2020-05-13 DIAGNOSIS — Z91040 Latex allergy status: Secondary | ICD-10-CM | POA: Diagnosis not present

## 2020-05-13 DIAGNOSIS — I10 Essential (primary) hypertension: Secondary | ICD-10-CM | POA: Diagnosis not present

## 2020-05-13 DIAGNOSIS — Z79899 Other long term (current) drug therapy: Secondary | ICD-10-CM | POA: Insufficient documentation

## 2020-05-13 DIAGNOSIS — J449 Chronic obstructive pulmonary disease, unspecified: Secondary | ICD-10-CM | POA: Insufficient documentation

## 2020-05-13 DIAGNOSIS — Z87891 Personal history of nicotine dependence: Secondary | ICD-10-CM | POA: Diagnosis not present

## 2020-05-13 DIAGNOSIS — R231 Pallor: Secondary | ICD-10-CM | POA: Diagnosis not present

## 2020-05-13 DIAGNOSIS — R55 Syncope and collapse: Secondary | ICD-10-CM | POA: Diagnosis not present

## 2020-05-13 DIAGNOSIS — R0602 Shortness of breath: Secondary | ICD-10-CM | POA: Insufficient documentation

## 2020-05-13 DIAGNOSIS — T679XXA Effect of heat and light, unspecified, initial encounter: Secondary | ICD-10-CM | POA: Diagnosis not present

## 2020-05-13 DIAGNOSIS — I959 Hypotension, unspecified: Secondary | ICD-10-CM | POA: Diagnosis not present

## 2020-05-13 LAB — CBG MONITORING, ED: Glucose-Capillary: 118 mg/dL — ABNORMAL HIGH (ref 70–99)

## 2020-05-13 LAB — BASIC METABOLIC PANEL
Anion gap: 11 (ref 5–15)
BUN: 23 mg/dL (ref 8–23)
CO2: 24 mmol/L (ref 22–32)
Calcium: 9.5 mg/dL (ref 8.9–10.3)
Chloride: 106 mmol/L (ref 98–111)
Creatinine, Ser: 1.32 mg/dL — ABNORMAL HIGH (ref 0.44–1.00)
GFR, Estimated: 44 mL/min — ABNORMAL LOW (ref >60)
Glucose, Bld: 84 mg/dL (ref 70–99)
Potassium: 4.1 mmol/L (ref 3.5–5.1)
Sodium: 141 mmol/L (ref 135–145)

## 2020-05-13 LAB — CBC WITH DIFFERENTIAL/PLATELET
Abs Immature Granulocytes: 0.2 10*3/uL — ABNORMAL HIGH (ref 0.00–0.07)
Basophils Absolute: 0.1 10*3/uL (ref 0.0–0.1)
Basophils Relative: 1 %
Eosinophils Absolute: 0.2 10*3/uL (ref 0.0–0.5)
Eosinophils Relative: 1 %
HCT: 41.1 % (ref 36.0–46.0)
Hemoglobin: 12.8 g/dL (ref 12.0–15.0)
Immature Granulocytes: 1 %
Lymphocytes Relative: 12 %
Lymphs Abs: 2.2 10*3/uL (ref 0.7–4.0)
MCH: 31.1 pg (ref 26.0–34.0)
MCHC: 31.1 g/dL (ref 30.0–36.0)
MCV: 100 fL (ref 80.0–100.0)
Monocytes Absolute: 1.9 10*3/uL — ABNORMAL HIGH (ref 0.1–1.0)
Monocytes Relative: 10 %
Neutro Abs: 13.9 10*3/uL — ABNORMAL HIGH (ref 1.7–7.7)
Neutrophils Relative %: 75 %
Platelets: 219 10*3/uL (ref 150–400)
RBC: 4.11 MIL/uL (ref 3.87–5.11)
RDW: 14.3 % (ref 11.5–15.5)
WBC: 18.5 10*3/uL — ABNORMAL HIGH (ref 4.0–10.5)
nRBC: 0 % (ref 0.0–0.2)

## 2020-05-13 LAB — TROPONIN I (HIGH SENSITIVITY): Troponin I (High Sensitivity): 7 ng/L (ref <18)

## 2020-05-13 MED ORDER — LACTATED RINGERS IV BOLUS
1000.0000 mL | Freq: Once | INTRAVENOUS | Status: AC
  Administered 2020-05-13: 1000 mL via INTRAVENOUS

## 2020-05-13 NOTE — ED Provider Notes (Signed)
[facility] EMERGENCY DEPARTMENT Provider Note   CSN: [id number] Arrival date & time: 05/13/20  1550     History Chief Complaint  Patient presents with  . Near Syncope    Maureen Chung is a 70 y.o. female w/ h/o HLD, HTN, hyperparathyroidism, and COPD who presents to the ED for near syncopal event. Patient awoke this AM in usually state of health. Got out of out hot shower earlier this afternoon and began feeling lightheaded, warm, and needed to have a bowel movement. She passed large bowel movement on commode and continued to feel lightheaded with diaphoresis and SOB. Family called EMS who arrived on scene. Glucose 192. EMS gave 325mL NS bolus. Patient states symptoms resolved after approximately 20 minutes and now back to her usual state of health. Denies headache, vision changes, chest pain, abdominal pain, GI bleed, slurred speech, numbness, or weakness.  The history is provided by the patient and medical records.  Weakness Severity:  Moderate Onset quality:  Sudden Duration:  20 minutes Timing:  Constant Progression:  Resolved Chronicity:  New Context comment:  Hot shower, bowel movement Relieved by:  Rest Worsened by:  Nothing Ineffective treatments:  None tried Associated symptoms: near-syncope   Associated symptoms: no abdominal pain, no arthralgias, no chest pain, no cough, no diarrhea, no dizziness, no dysuria, no falls, no fever, no headaches, no hematochezia, no loss of consciousness, no melena, no nausea, no seizures, no shortness of breath, no syncope and no vomiting   Risk factors: no coronary artery disease, no heart disease and no new medications        Past Medical History:  Diagnosis Date  . Anxiety   . Arthritis   . Hypercalcemia   . Hyperlipidemia   . Hyperparathyroidism   . Insomnia   . Osteoporosis   . Pericarditis     Patient Active Problem List   Diagnosis Date Noted  . Abnormal findings on diagnostic imaging of lung  12/13/2018  . Healthcare maintenance 12/13/2018  . Former smoker 11/16/2018  . COPD GOLD 0  11/15/2018  . Chest pain 10/18/2015  . Hyperparathyroidism, primary ([place]) 11/27/2010    Past Surgical History:  Procedure Laterality Date  . BREAST CYST EXCISION  1997   benign  . CARDIAC CATHETERIZATION N/A 10/18/2015   Procedure: Left Heart Cath and Coronary Angiography;  Surgeon: Josue Hector, MD;  Location: [facility];  Service: Cardiovascular;  Laterality: N/A;  . child birth  26  . LAPAROSCOPIC ENDOMETRIOSIS FULGURATION  1989, 1995  . PARATHYROIDECTOMY  11/01/10  . TONSILECTOMY, ADENOIDECTOMY, BILATERAL MYRINGOTOMY AND TUBES  1969  . VAGINAL HYSTERECTOMY  1981   without ovaries     OB History   No obstetric history on file.     Family History  Problem Relation Age of Onset  . Heart disease Father   . Diabetes Father   . Cancer Mother        ovarian  . Coronary artery disease Brother   . Bladder Cancer Brother   . Heart disease Brother     Social History   Tobacco Use  . Smoking status: Former Smoker    Packs/day: 1.00    Years: 41.00    Pack years: 41.00    Types: Cigarettes    Quit date: 11/12/2019    Years since quitting: 0.5  . Smokeless tobacco: Never Used  . Tobacco comment: Encouraged to develop a plan or come to me for assistance when ready to set  a date.  Vaping Use  . Vaping Use: Never used  Substance Use Topics  . Alcohol use: Yes    Alcohol/week: 0.0 standard drinks    Comment: once per day  . Drug use: No    Home Medications Prior to Admission medications   Medication Sig Start Date End Date Taking? Authorizing Provider  acetaminophen (TYLENOL) 325 MG tablet Take 650 mg by mouth every 6 (six) hours as needed for mild pain.   Yes [provider]  albuterol (VENTOLIN HFA) 108 (90 Base) MCG/ACT inhaler Inhale 2 puffs into the lungs every 6 (six) hours as needed for wheezing or shortness of breath. 12/13/18  Yes Lauraine Rinne, NP   atorvastatin (LIPITOR) 10 MG tablet Take 10 mg by mouth daily.   Yes [provider]  baclofen (LIORESAL) 10 MG tablet Take 1/2 to 1 by mouth every 8hrs as needed for spasm Patient taking differently: Take 5-10 mg by mouth 3 (three) times daily. Take 1/2 to 1 by mouth every 8hrs as needed for spasm 02/13/20  Yes Magnus Sinning, MD  Bimatoprost (LUMIGAN OP) Apply 1 drop to eye at bedtime. Both eyes   Yes [provider]  budesonide-formoterol (SYMBICORT) 160-4.5 MCG/ACT inhaler Inhale 2 puffs into the lungs 2 (two) times daily. 02/03/20  Yes Lauraine Rinne, NP  cyclobenzaprine (FLEXERIL) 10 MG tablet TAKE 1 TABLET THREE TIMES DAILY AS NEEDED FOR MUSCLE SPASMS. Patient taking differently: Take 10 mg by mouth 3 (three) times daily as needed for muscle spasms. TAKE 1 TABLET THREE TIMES DAILY AS NEEDED FOR MUSCLE SPASMS. 03/05/20  Yes Magnus Sinning, MD  Magnesium Oxide (MAG-OXIDE PO) Take 1 tablet by mouth daily.   Yes [provider]  metoprolol succinate (TOPROL-XL) 25 MG 24 hr tablet Take 25 mg by mouth daily. 10/14/18  Yes [provider]  pantoprazole (PROTONIX) 40 MG tablet Take 40 mg by mouth daily. 02/01/20  Yes [provider]  sertraline (ZOLOFT) 100 MG tablet Take 200 mg by mouth daily. 08/01/15  Yes [provider]  tacrolimus (PROTOPIC) 0.1 % ointment Apply 1 application topically at bedtime. Bedtime 02/23/20  Yes [provider]  nitroGLYCERIN (NITROSTAT) 0.4 MG SL tablet Place 1 tablet (0.4 mg total) under the tongue every 5 (five) minutes as needed for chest pain. 09/12/15   Josue Hector, MD    Allergies    Latex  Review of Systems   Review of Systems  Constitutional: Positive for diaphoresis and fatigue. Negative for chills and fever.  HENT: Negative for ear pain and sore throat.   Eyes: Negative for pain and visual disturbance.  Respiratory: Negative for cough and shortness of breath.   Cardiovascular: Positive  for near-syncope. Negative for chest pain, palpitations and syncope.  Gastrointestinal: Negative for abdominal pain, blood in stool, diarrhea, hematochezia, melena, nausea and vomiting.  Genitourinary: Negative for dysuria and hematuria.  Musculoskeletal: Negative for arthralgias, back pain and falls.  Skin: Negative for color change and rash.  Neurological: Positive for weakness. Negative for dizziness, seizures, loss of consciousness, syncope and headaches.  All other systems reviewed and are negative.   Physical Exam Updated Vital Signs BP 137/61 (BP Location: Right Arm)   Pulse 65   Temp 98.7 F (37.1 C) (Oral)   Resp 16   Ht 5\' 4"  (1.626 m)   Wt 59.1 kg   SpO2 98%   BMI 22.36 kg/m   Physical Exam Vitals and nursing note reviewed.  Constitutional:  General: She is awake. She is not in acute distress.    Appearance: Normal appearance. She is well-developed, well-groomed, normal weight and well-nourished. She is not ill-appearing.  HENT:     Head: Normocephalic and atraumatic.     Right Ear: External ear normal.     Left Ear: External ear normal.     Nose: Nose normal. No congestion or rhinorrhea.     Mouth/Throat:     Mouth: Mucous membranes are moist.     Pharynx: Oropharynx is clear. No oropharyngeal exudate or posterior oropharyngeal erythema.  Eyes:     General: No visual field deficit or scleral icterus.       Right eye: No discharge.        Left eye: No discharge.     Extraocular Movements: Extraocular movements intact.     Conjunctiva/sclera: Conjunctivae normal.     Pupils: Pupils are equal, round, and reactive to light.  Cardiovascular:     Rate and Rhythm: Normal rate and regular rhythm.     Pulses: Normal pulses.     Heart sounds: Normal heart sounds. No murmur heard.   Pulmonary:     Effort: Pulmonary effort is normal. No respiratory distress.     Breath sounds: Normal breath sounds. No wheezing, rhonchi or rales.  Abdominal:     General: Abdomen  is flat. There is no distension.     Palpations: Abdomen is soft.     Tenderness: There is no abdominal tenderness. There is no guarding.  Musculoskeletal:        General: No signs of injury or edema.     Cervical back: Neck supple.     Right lower leg: No edema.     Left lower leg: No edema.  Skin:    General: Skin is warm and dry.     Findings: No rash.  Neurological:     General: No focal deficit present.     Mental Status: She is alert and oriented to person, place, and time.     GCS: GCS eye subscore is 4. GCS verbal subscore is 5. GCS motor subscore is 6.     Cranial Nerves: Cranial nerves are intact. No cranial nerve deficit, dysarthria or facial asymmetry.     Sensory: Sensation is intact. No sensory deficit.     Motor: Motor function is intact. No weakness.     Coordination: Coordination is intact. Finger-Nose-Finger Test and Heel to [facility] Test normal.  Psychiatric:        Mood and Affect: Mood and affect and mood normal.        Behavior: Behavior normal. Behavior is cooperative.     ED Results / Procedures / Treatments   Labs (all labs ordered are listed, but only abnormal results are displayed) Labs Reviewed  CBC WITH DIFFERENTIAL/PLATELET - Abnormal; Notable for the following components:      Result Value   WBC 18.5 (*)    Neutro Abs 13.9 (*)    Monocytes Absolute 1.9 (*)    Abs Immature Granulocytes 0.20 (*)    All other components within normal limits  BASIC METABOLIC PANEL - Abnormal; Notable for the following components:   Creatinine, Ser 1.32 (*)    GFR, Estimated 44 (*)    All other components within normal limits  CBG MONITORING, ED - Abnormal; Notable for the following components:   Glucose-Capillary 118 (*)    All other components within normal limits  TROPONIN I (HIGH SENSITIVITY)    EKG  EKG Interpretation  Date/Time:  Sunday May 13 2020 15:56:24 EST Ventricular Rate:  64 PR Interval:    QRS Duration: 88 QT Interval:  424 QTC  Calculation: 438 R Axis:   65 Text Interpretation: Sinus rhythm slince 2017, no changes seen Normal ECG Confirmed by Noemi Chapel [phone]) on 05/13/2020 4:05:13 PM   Radiology DG Chest Portable 1 View  Result Date: 05/13/2020 CLINICAL DATA:  Near syncope EXAM: PORTABLE CHEST 1 VIEW COMPARISON:  chest x-ray 11/15/2018, CT chest 01/14/2019 FINDINGS: The heart size and mediastinal contours are within normal limits. Aortic arch calcification. Suggestion of an 8 mm paramediastinal right mid lung zone nodule that likely represents a vessel en face. No focal consolidation. No pulmonary edema. No pleural effusion. No pneumothorax. No acute osseous abnormality. IMPRESSION: Suggestion of an 8 mm paramediastinal right mid lung zone nodule that likely represents a vessel en face. Consider repeat PA and lateral chest x-ray. Electronically Signed   By: Iven Finn M.D.   On: 05/13/2020 16:36    Procedures Procedures   Medications Ordered in ED Medications  lactated ringers bolus 1,000 mL (0 mLs Intravenous Stopped 05/13/20 1934)    ED Course  I have reviewed the triage vital signs and the nursing notes.  Pertinent labs & imaging results that were available during my care of the patient were reviewed by me and considered in my medical decision making (see chart for details).    MDM Rules/Calculators/A&P                          Patient is a 32yoF with history and physical as described above who presents to the ED for near-syncope. VS reassuring and HDS. Patient asymptomatic on exam and resting comfortably. Initial ECG reassuring. Additional workup includes CXR, CBC, troponin, BMP, and finger glucose.  Nodule noted on CXR. Patient currently enrolled in lung cancer research study and scheduled to have annual CT chest done next month. Leukocytosis on CBC which is likely secondary to stress response. Doubt infectious etiology given no reported infectious symptoms, afebrile, no tachycardia, and no  tachypnea. Cr slightly elevated but not significantly changed from last BMP on 12/07/18 (Cr 1.25). Labs otherwise reassuring. Suspect presentation likely vasovagal given symptoms precipitated following hot shower and passing large bowel movement. Low suspicion for cardiogenic etiology given no underlying cardiac disease and reassuring workup. Presentation not consistent with CVA/TIA, ICH, vertebrobasilar insufficiency, seizure, adverse medication reaction, orthostatic hypotension, or electrolyte derangement.Patient ambulating safely in ED. She is otherwise HDS and appropriate for discharge.  Strict return precautions provided and discussed. Questions and concerns addressed. Recommend follow up with PCP on outpatient basis as needed. Patient verbalized understanding and amenable with discharge plan. Discharged in stable condition.  Final Clinical Impression(s) / ED Diagnoses Final diagnoses:  Near syncope    Rx / DC Orders ED Discharge Orders    None       Christy Gentles, MD 05/14/20 Lupita Shutter    Noemi Chapel, MD 05/15/20 [phone]

## 2020-05-13 NOTE — ED Triage Notes (Signed)
Pt arrived to ED from home via EMS w/ c/o near syncope after getting out of the shower. Pt reports the shower was really hot and she dried off and then felt like she was going to pass out. EMS reports pt was diaphoretic on scene and A&Ox4. Pt reports feeling hot and lightheaded, she sat on the toilet and had to have a BM and reports she was breathing heavily through her mouth, pt denies straining while using the bathroom. Pt then put a wet paper towel on her neck. Pt reports it helped, but didn't resolve s/s.  VS: BP 118/56, HR 70, RR 16, O2 100%, CBG 192. IV 18g R AC, EMS gave 330mL NS

## 2020-05-28 ENCOUNTER — Other Ambulatory Visit: Payer: Self-pay

## 2020-05-28 ENCOUNTER — Ambulatory Visit
Admission: RE | Admit: 2020-05-28 | Discharge: 2020-05-28 | Disposition: A | Discharge status: Home / Self Care | Payer: Medicare Other | Source: Ambulatory Visit | Attending: Acute Care | Admitting: Acute Care

## 2020-05-28 DIAGNOSIS — L738 Other specified follicular disorders: Secondary | ICD-10-CM | POA: Diagnosis not present

## 2020-05-28 DIAGNOSIS — F17210 Nicotine dependence, cigarettes, uncomplicated: Secondary | ICD-10-CM

## 2020-05-28 DIAGNOSIS — Z720 Tobacco use: Secondary | ICD-10-CM | POA: Diagnosis not present

## 2020-05-28 DIAGNOSIS — Z79899 Other long term (current) drug therapy: Secondary | ICD-10-CM | POA: Diagnosis not present

## 2020-05-28 DIAGNOSIS — I700 Atherosclerosis of aorta: Secondary | ICD-10-CM | POA: Diagnosis not present

## 2020-05-28 DIAGNOSIS — Z1159 Encounter for screening for other viral diseases: Secondary | ICD-10-CM | POA: Diagnosis not present

## 2020-05-28 DIAGNOSIS — J432 Centrilobular emphysema: Secondary | ICD-10-CM | POA: Diagnosis not present

## 2020-05-28 DIAGNOSIS — E559 Vitamin D deficiency, unspecified: Secondary | ICD-10-CM | POA: Diagnosis not present

## 2020-05-28 DIAGNOSIS — D72829 Elevated white blood cell count, unspecified: Secondary | ICD-10-CM | POA: Diagnosis not present

## 2020-05-28 DIAGNOSIS — Z87891 Personal history of nicotine dependence: Secondary | ICD-10-CM | POA: Diagnosis not present

## 2020-05-28 DIAGNOSIS — J984 Other disorders of lung: Secondary | ICD-10-CM | POA: Diagnosis not present

## 2020-05-28 DIAGNOSIS — Z0001 Encounter for general adult medical examination with abnormal findings: Secondary | ICD-10-CM | POA: Diagnosis not present

## 2020-05-28 DIAGNOSIS — E7800 Pure hypercholesterolemia, unspecified: Secondary | ICD-10-CM | POA: Diagnosis not present

## 2020-05-28 DIAGNOSIS — I2510 Atherosclerotic heart disease of native coronary artery without angina pectoris: Secondary | ICD-10-CM | POA: Diagnosis not present

## 2020-05-31 NOTE — Progress Notes (Signed)

## 2020-06-05 DIAGNOSIS — C44722 Squamous cell carcinoma of skin of right lower limb, including hip: Secondary | ICD-10-CM | POA: Diagnosis not present

## 2020-06-07 ENCOUNTER — Other Ambulatory Visit: Payer: Self-pay | Admitting: *Deleted

## 2020-06-07 DIAGNOSIS — Z87891 Personal history of nicotine dependence: Secondary | ICD-10-CM

## 2020-06-12 DIAGNOSIS — R112 Nausea with vomiting, unspecified: Secondary | ICD-10-CM | POA: Diagnosis not present

## 2020-06-12 DIAGNOSIS — K219 Gastro-esophageal reflux disease without esophagitis: Secondary | ICD-10-CM | POA: Diagnosis not present

## 2020-06-12 DIAGNOSIS — K449 Diaphragmatic hernia without obstruction or gangrene: Secondary | ICD-10-CM | POA: Diagnosis not present

## 2020-06-13 ENCOUNTER — Other Ambulatory Visit (HOSPITAL_COMMUNITY): Payer: Self-pay | Admitting: Gastroenterology

## 2020-06-13 ENCOUNTER — Other Ambulatory Visit: Payer: Self-pay | Admitting: Gastroenterology

## 2020-06-13 DIAGNOSIS — R112 Nausea with vomiting, unspecified: Secondary | ICD-10-CM

## 2020-06-15 DIAGNOSIS — R030 Elevated blood-pressure reading, without diagnosis of hypertension: Secondary | ICD-10-CM | POA: Diagnosis not present

## 2020-06-15 DIAGNOSIS — M4712 Other spondylosis with myelopathy, cervical region: Secondary | ICD-10-CM | POA: Diagnosis not present

## 2020-07-02 ENCOUNTER — Other Ambulatory Visit: Payer: Self-pay

## 2020-07-02 ENCOUNTER — Encounter (HOSPITAL_COMMUNITY)
Admission: RE | Admit: 2020-07-02 | Discharge: 2020-07-02 | Disposition: A | Discharge status: Home / Self Care | Payer: Medicare Other | Source: Ambulatory Visit | Attending: Gastroenterology | Admitting: Gastroenterology

## 2020-07-02 DIAGNOSIS — R112 Nausea with vomiting, unspecified: Secondary | ICD-10-CM | POA: Insufficient documentation

## 2020-07-03 DIAGNOSIS — Z23 Encounter for immunization: Secondary | ICD-10-CM | POA: Diagnosis not present

## 2020-07-05 ENCOUNTER — Inpatient Hospital Stay: Admit: 2020-07-05 | Payer: Medicare Other | Admitting: Neurosurgery

## 2020-07-05 ENCOUNTER — Encounter (HOSPITAL_COMMUNITY)
Admission: RE | Admit: 2020-07-05 | Discharge: 2020-07-05 | Disposition: A | Discharge status: Home / Self Care | Payer: Medicare Other | Source: Ambulatory Visit | Attending: Gastroenterology | Admitting: Gastroenterology

## 2020-07-05 ENCOUNTER — Other Ambulatory Visit: Payer: Self-pay

## 2020-07-05 DIAGNOSIS — R112 Nausea with vomiting, unspecified: Secondary | ICD-10-CM | POA: Diagnosis not present

## 2020-07-05 SURGERY — ANTERIOR CERVICAL DECOMPRESSION/DISCECTOMY FUSION 3 LEVELS
Anesthesia: General

## 2020-07-05 MED ORDER — TECHNETIUM TC 99M MEBROFENIN IV KIT
5.0000 | PACK | Freq: Once | INTRAVENOUS | Status: AC | PRN
  Administered 2020-07-05: 5 via INTRAVENOUS

## 2020-07-25 DIAGNOSIS — Z1231 Encounter for screening mammogram for malignant neoplasm of breast: Secondary | ICD-10-CM | POA: Diagnosis not present

## 2020-07-25 DIAGNOSIS — Z01419 Encounter for gynecological examination (general) (routine) without abnormal findings: Secondary | ICD-10-CM | POA: Diagnosis not present

## 2020-07-25 DIAGNOSIS — Z6824 Body mass index (BMI) 24.0-24.9, adult: Secondary | ICD-10-CM | POA: Diagnosis not present

## 2020-07-25 DIAGNOSIS — M810 Age-related osteoporosis without current pathological fracture: Secondary | ICD-10-CM | POA: Diagnosis not present

## 2020-07-25 DIAGNOSIS — Z7689 Persons encountering health services in other specified circumstances: Secondary | ICD-10-CM | POA: Diagnosis not present

## 2020-07-31 DIAGNOSIS — L905 Scar conditions and fibrosis of skin: Secondary | ICD-10-CM | POA: Diagnosis not present

## 2020-07-31 DIAGNOSIS — L98499 Non-pressure chronic ulcer of skin of other sites with unspecified severity: Secondary | ICD-10-CM | POA: Diagnosis not present

## 2020-07-31 DIAGNOSIS — I872 Venous insufficiency (chronic) (peripheral): Secondary | ICD-10-CM | POA: Diagnosis not present

## 2020-08-15 DIAGNOSIS — H401130 Primary open-angle glaucoma, bilateral, stage unspecified: Secondary | ICD-10-CM | POA: Diagnosis not present

## 2020-09-13 DIAGNOSIS — K760 Fatty (change of) liver, not elsewhere classified: Secondary | ICD-10-CM | POA: Diagnosis not present

## 2020-10-02 DIAGNOSIS — L905 Scar conditions and fibrosis of skin: Secondary | ICD-10-CM | POA: Diagnosis not present

## 2020-10-12 ENCOUNTER — Ambulatory Visit: Payer: Medicare Other | Admitting: Cardiovascular Disease

## 2020-10-25 DIAGNOSIS — M810 Age-related osteoporosis without current pathological fracture: Secondary | ICD-10-CM | POA: Diagnosis not present

## 2020-11-05 DIAGNOSIS — L309 Dermatitis, unspecified: Secondary | ICD-10-CM | POA: Diagnosis not present

## 2020-11-05 DIAGNOSIS — L718 Other rosacea: Secondary | ICD-10-CM | POA: Diagnosis not present

## 2020-11-05 DIAGNOSIS — L821 Other seborrheic keratosis: Secondary | ICD-10-CM | POA: Diagnosis not present

## 2020-11-05 DIAGNOSIS — L814 Other melanin hyperpigmentation: Secondary | ICD-10-CM | POA: Diagnosis not present

## 2020-11-05 DIAGNOSIS — L820 Inflamed seborrheic keratosis: Secondary | ICD-10-CM | POA: Diagnosis not present

## 2020-11-05 DIAGNOSIS — L905 Scar conditions and fibrosis of skin: Secondary | ICD-10-CM | POA: Diagnosis not present

## 2020-11-05 DIAGNOSIS — D225 Melanocytic nevi of trunk: Secondary | ICD-10-CM | POA: Diagnosis not present

## 2020-11-05 DIAGNOSIS — Z85828 Personal history of other malignant neoplasm of skin: Secondary | ICD-10-CM | POA: Diagnosis not present

## 2020-12-07 ENCOUNTER — Ambulatory Visit: Payer: Self-pay

## 2020-12-07 ENCOUNTER — Ambulatory Visit (INDEPENDENT_AMBULATORY_CARE_PROVIDER_SITE_OTHER): Payer: Medicare Other | Admitting: Surgical

## 2020-12-07 ENCOUNTER — Encounter: Payer: Self-pay | Admitting: Surgical

## 2020-12-07 DIAGNOSIS — M25561 Pain in right knee: Secondary | ICD-10-CM

## 2020-12-07 DIAGNOSIS — M1712 Unilateral primary osteoarthritis, left knee: Secondary | ICD-10-CM

## 2020-12-07 DIAGNOSIS — M25562 Pain in left knee: Secondary | ICD-10-CM | POA: Diagnosis not present

## 2020-12-07 DIAGNOSIS — M1711 Unilateral primary osteoarthritis, right knee: Secondary | ICD-10-CM

## 2020-12-07 DIAGNOSIS — G8929 Other chronic pain: Secondary | ICD-10-CM | POA: Diagnosis not present

## 2020-12-07 MED ORDER — METHYLPREDNISOLONE ACETATE 40 MG/ML IJ SUSP
40.0000 mg | INTRAMUSCULAR | Status: AC | PRN
  Administered 2020-12-07: 40 mg via INTRA_ARTICULAR

## 2020-12-07 MED ORDER — BUPIVACAINE HCL 0.25 % IJ SOLN
4.0000 mL | INTRAMUSCULAR | Status: AC | PRN
  Administered 2020-12-07: 4 mL via INTRA_ARTICULAR

## 2020-12-07 MED ORDER — LIDOCAINE HCL 1 % IJ SOLN
5.0000 mL | INTRAMUSCULAR | Status: AC | PRN
  Administered 2020-12-07: 5 mL

## 2020-12-07 MED ORDER — METHYLPREDNISOLONE ACETATE 40 MG/ML IJ SUSP
40.0000 mg | INTRAMUSCULAR | Status: AC | PRN
Start: 2020-12-07 — End: 2020-12-07
  Administered 2020-12-07: 40 mg via INTRA_ARTICULAR

## 2020-12-07 NOTE — Progress Notes (Signed)
Office Visit Note   Patient: Maureen Chung           Date of Birth: 1951/03/06           MRN: [id number] Visit Date: 12/07/2020 Requested by: London Pepper, MD [facility] [address] [place],  [place] [address] PCP: London Pepper, MD  Subjective: Chief Complaint  Patient presents with  . Left Knee - Pain  . Right Knee - Pain    HPI: Maureen Chung is a 70 y.o. female who presents to the office complaining of bilateral knee pain right greater than left.  Localizes pain to the anterior aspect of the knees.  Pain is worse with stairs and kneeling.  She has tried Tylenol with some relief.  She denies any groin pain, low back pain, radicular pain, numbness/tingling.  She has no history of prior dislocation of the patella or any previous knee surgery.  Never had knee injections before.  Has walking endurance about to the end of her driveway.  No locking symptoms but occasional instability but last instability event was 4 weeks ago.  Denies any history of diabetes, smoking, recent steroid use, use of blood thinners.  She enjoys gardening in her free time..                ROS: All systems reviewed are negative as they relate to the chief complaint within the history of present illness.  Patient denies fevers or chills.  Assessment & Plan: Visit Diagnoses:  1. Chronic pain of both knees     Plan: Patient is a 70 year old female who presents complaining of bilateral knee pain.  Pain is been ongoing for several years but has been worsening recently.  She has walking endurance about to the end of her driveway but no recurring instability or locking symptoms.  No history of prior surgery or injection.  Radiographs today reveal moderate to severe patellofemoral osteoarthritis with not much wear in the medial or lateral compartments.  Discussed options available to patient.  She would like to try injections today.  Injections administered and patient tolerated the procedure well.  Plan to  follow-up with the office as needed.  Can try gel injections next time when the cortisone injections wear off.  This patient is diagnosed with osteoarthritis of the knee(s).    Radiographs show evidence of joint space narrowing, osteophytes, subchondral sclerosis and/or subchondral cysts.  This patient has knee pain which interferes with functional and activities of daily living.    This patient has experienced inadequate response, adverse effects and/or intolerance with conservative treatments such as acetaminophen, NSAIDS, topical creams, physical therapy or regular exercise, knee bracing and/or weight loss.   This patient has experienced inadequate response or has a contraindication to intra articular steroid injections for at least 3 months.   This patient is not scheduled to have a total knee replacement within 6 months of starting treatment with viscosupplementation.   Follow-Up Instructions: No follow-ups on file.   Orders:  Orders Placed This Encounter  Procedures  . XR KNEE 3 VIEW LEFT  . XR KNEE 3 VIEW RIGHT   No orders of the defined types were placed in this encounter.     Procedures: Large Joint Inj: bilateral knee on 12/07/2020 11:46 AM Indications: diagnostic evaluation, joint swelling and pain Details: 18 G 1.5 in needle, superolateral approach  Arthrogram: No  Medications (Right): 5 mL lidocaine 1 %; 4 mL bupivacaine 0.25 %; 40 mg methylPREDNISolone acetate 40 MG/ML Medications (  Left): 5 mL lidocaine 1 %; 4 mL bupivacaine 0.25 %; 40 mg methylPREDNISolone acetate 40 MG/ML Outcome: tolerated well, no immediate complications Procedure, treatment alternatives, risks and benefits explained, specific risks discussed. Consent was given by the patient. Immediately prior to procedure a time out was called to verify the correct patient, procedure, equipment, support staff and site/side marked as required. Patient was prepped and draped in the usual sterile fashion.       Clinical Data: No additional findings.  Objective: Vital Signs: There were no vitals taken for this visit.  Physical Exam:  Constitutional: Patient appears well-developed HEENT:  Head: Normocephalic Eyes:EOM are normal Neck: Normal range of motion Cardiovascular: Normal rate Pulmonary/chest: Effort normal Neurologic: Patient is alert Skin: Skin is warm Psychiatric: Patient has normal mood and affect  Ortho Exam: Ortho exam demonstrates right leg with 1+ PT pulse.  Left leg with 2+ DP pulse.  Range of motion from 0 degrees to 120 degrees bilaterally.  No effusion present bilaterally.  No pain with hip range of motion.  Able to perform straight leg raise.  Patellar crepitus noted bilaterally.  Negative Stinchfield sign.  Positive patellar grind test.  No tenderness over the medial and lateral joint lines, patellar tendon, quad tendon.  Specialty Comments:  No specialty comments available.  Imaging: No results found.   PMFS History: Patient Active Problem List   Diagnosis Date Noted  . Abnormal findings on diagnostic imaging of lung 12/13/2018  . Healthcare maintenance 12/13/2018  . Former smoker 11/16/2018  . COPD GOLD 0  11/15/2018  . Chest pain 10/18/2015  . Hyperparathyroidism, primary ([facility]) 11/27/2010   Past Medical History:  Diagnosis Date  . Anxiety   . Arthritis   . Hypercalcemia   . Hyperlipidemia   . Hyperparathyroidism   . Insomnia   . Osteoporosis   . Pericarditis     Family History  Problem Relation Age of Onset  . Heart disease Father   . Diabetes Father   . Cancer Mother        ovarian  . Coronary artery disease Brother   . Bladder Cancer Brother   . Heart disease Brother     Past Surgical History:  Procedure Laterality Date  . BREAST CYST EXCISION  1997   benign  . CARDIAC CATHETERIZATION N/A 10/18/2015   Procedure: Left Heart Cath and Coronary Angiography;  Surgeon: Josue Hector, MD;  Location: [facility];  Service:  Cardiovascular;  Laterality: N/A;  . child birth  29  . LAPAROSCOPIC ENDOMETRIOSIS FULGURATION  1989, 1995  . PARATHYROIDECTOMY  11/01/10  . TONSILECTOMY, ADENOIDECTOMY, BILATERAL MYRINGOTOMY AND TUBES  1969  . VAGINAL HYSTERECTOMY  1981   without ovaries   Social History   Occupational History  . Not on file  Tobacco Use  . Smoking status: Former    Packs/day: 1.00    Years: 41.00    Pack years: 41.00    Types: Cigarettes    Quit date: 11/12/2019    Years since quitting: 1.0  . Smokeless tobacco: Never  . Tobacco comments:    Encouraged to develop a plan or come to me for assistance when ready to set a date.  Vaping Use  . Vaping Use: Never used  Substance and Sexual Activity  . Alcohol use: Yes    Alcohol/week: 0.0 standard drinks    Comment: once per day  . Drug use: No  . Sexual activity: Not on file

## 2020-12-17 DIAGNOSIS — H401131 Primary open-angle glaucoma, bilateral, mild stage: Secondary | ICD-10-CM | POA: Diagnosis not present

## 2020-12-25 ENCOUNTER — Other Ambulatory Visit: Payer: Self-pay | Admitting: Student

## 2020-12-25 DIAGNOSIS — M4312 Spondylolisthesis, cervical region: Secondary | ICD-10-CM | POA: Diagnosis not present

## 2020-12-25 DIAGNOSIS — M542 Cervicalgia: Secondary | ICD-10-CM | POA: Diagnosis not present

## 2020-12-25 DIAGNOSIS — M4712 Other spondylosis with myelopathy, cervical region: Secondary | ICD-10-CM

## 2020-12-31 DIAGNOSIS — I129 Hypertensive chronic kidney disease with stage 1 through stage 4 chronic kidney disease, or unspecified chronic kidney disease: Secondary | ICD-10-CM | POA: Diagnosis not present

## 2020-12-31 DIAGNOSIS — N1831 Chronic kidney disease, stage 3a: Secondary | ICD-10-CM | POA: Diagnosis not present

## 2020-12-31 DIAGNOSIS — E785 Hyperlipidemia, unspecified: Secondary | ICD-10-CM | POA: Diagnosis not present

## 2020-12-31 DIAGNOSIS — M810 Age-related osteoporosis without current pathological fracture: Secondary | ICD-10-CM | POA: Diagnosis not present

## 2020-12-31 DIAGNOSIS — E210 Primary hyperparathyroidism: Secondary | ICD-10-CM | POA: Diagnosis not present

## 2020-12-31 DIAGNOSIS — I2510 Atherosclerotic heart disease of native coronary artery without angina pectoris: Secondary | ICD-10-CM | POA: Diagnosis not present

## 2021-01-01 ENCOUNTER — Other Ambulatory Visit: Payer: Self-pay | Admitting: Neurosurgery

## 2021-01-01 ENCOUNTER — Telehealth: Payer: Self-pay | Admitting: *Deleted

## 2021-01-01 DIAGNOSIS — I700 Atherosclerosis of aorta: Secondary | ICD-10-CM | POA: Diagnosis not present

## 2021-01-01 DIAGNOSIS — E213 Hyperparathyroidism, unspecified: Secondary | ICD-10-CM | POA: Diagnosis not present

## 2021-01-01 DIAGNOSIS — I2510 Atherosclerotic heart disease of native coronary artery without angina pectoris: Secondary | ICD-10-CM | POA: Diagnosis not present

## 2021-01-01 DIAGNOSIS — K449 Diaphragmatic hernia without obstruction or gangrene: Secondary | ICD-10-CM | POA: Diagnosis not present

## 2021-01-01 DIAGNOSIS — R112 Nausea with vomiting, unspecified: Secondary | ICD-10-CM | POA: Diagnosis not present

## 2021-01-01 DIAGNOSIS — M810 Age-related osteoporosis without current pathological fracture: Secondary | ICD-10-CM | POA: Diagnosis not present

## 2021-01-01 DIAGNOSIS — Z23 Encounter for immunization: Secondary | ICD-10-CM | POA: Diagnosis not present

## 2021-01-01 NOTE — Telephone Encounter (Signed)
   Name: Maureen Chung  DOB: 1950/04/21  MRN: [id number]  Primary Cardiologist: Jenkins Rouge, MD  Chart reviewed as part of pre-operative protocol coverage. Because of Maureen Chung's past medical history and time since last visit, she will require a follow-up visit in order to better assess preoperative cardiovascular risk.  Pre-op covering staff: - Please schedule appointment and call patient to inform them. If patient already had an upcoming appointment within acceptable timeframe, please add "pre-op clearance" to the appointment notes so provider is aware. - Please contact requesting surgeon's office via preferred method (i.e, phone, fax) to inform them of need for appointment prior to surgery.  If applicable, this message will also be routed to pharmacy pool and/or primary cardiologist for input on holding anticoagulant/antiplatelet agent as requested below so that this information is available to the clearing provider at time of patient's appointment.   Patient will need a earlier visit.  She is currently scheduled to see Dr. Johnsie Cancel on 04/17/2021, however her surgery is on 03/21/2021.  Please move her appointment up to prior to the surgery.  [place], [place]  01/01/2021, 1:12 PM

## 2021-01-01 NOTE — Telephone Encounter (Signed)
   Pre-operative Risk Assessment    Patient Name: Maureen Chung  DOB: [birth year] MRN: [id number]      Request for Surgical Clearance   Procedure:   CERVICAL FUSION  Date of Surgery: Clearance 02/11/21                                 Surgeon:  DR. Newman Pies Surgeon's Group or Practice Name:  [facility] Phone number:  [phone] Fax number:  [phone] ATTN: Lexine Baton   Type of Clearance Requested: - Medical    Type of Anesthesia:   General    Additional requests/questions:   Jiles Prows   01/01/2021, 12:26 PM

## 2021-01-01 NOTE — Telephone Encounter (Signed)
S/w the pt and she is agreeable to plan of care for pre op appt. Pt has been scheduled to see Melina Copa, PA 01/16/21 @ 1:30. Pt aware we will keep the appt with Dr. Johnsie Cancel so she can follow up with MD in Feb 2023. Pt is grateful for the call and the help. I will send surgeon's office pt has appt 01/16/21.

## 2021-01-11 DIAGNOSIS — Z23 Encounter for immunization: Secondary | ICD-10-CM | POA: Diagnosis not present

## 2021-01-14 ENCOUNTER — Encounter: Payer: Self-pay | Admitting: Physician Assistant
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
## 2021-01-16 ENCOUNTER — Ambulatory Visit (INDEPENDENT_AMBULATORY_CARE_PROVIDER_SITE_OTHER): Payer: Medicare Other | Admitting: Physician Assistant

## 2021-01-16 ENCOUNTER — Encounter: Payer: Self-pay | Admitting: Physician Assistant

## 2021-01-16 ENCOUNTER — Other Ambulatory Visit: Payer: Self-pay

## 2021-01-16 VITALS — BP 126/72 | HR 60 | Ht 64.0 in | Wt 133.2 lb

## 2021-01-16 DIAGNOSIS — I2510 Atherosclerotic heart disease of native coronary artery without angina pectoris: Secondary | ICD-10-CM

## 2021-01-16 DIAGNOSIS — Z01810 Encounter for preprocedural cardiovascular examination: Secondary | ICD-10-CM | POA: Diagnosis not present

## 2021-01-16 DIAGNOSIS — Z01818 Encounter for other preprocedural examination: Secondary | ICD-10-CM

## 2021-01-16 DIAGNOSIS — R0600 Dyspnea, unspecified: Secondary | ICD-10-CM | POA: Diagnosis not present

## 2021-01-16 DIAGNOSIS — R0989 Other specified symptoms and signs involving the circulatory and respiratory systems: Secondary | ICD-10-CM

## 2021-01-16 DIAGNOSIS — I5189 Other ill-defined heart diseases: Secondary | ICD-10-CM | POA: Diagnosis not present

## 2021-01-16 DIAGNOSIS — E785 Hyperlipidemia, unspecified: Secondary | ICD-10-CM | POA: Diagnosis not present

## 2021-01-16 MED ORDER — METOPROLOL TARTRATE 50 MG PO TABS: ORAL_TABLET | ORAL | 0 refills | Status: DC

## 2021-01-16 NOTE — Patient Instructions (Addendum)
Medication Instructions:  Your physician recommends that you continue on your current medications as directed. Please refer to the Current Medication list given to you today.  *If you need a refill on your cardiac medications before your next appointment, please call your pharmacy*   Lab Work: TODAY:  CMET, CBC, TSH, LIPID, & PRO BNP  If you have labs (blood work) drawn today and your tests are completely normal, you will receive your results only by: [place] (if you have MyChart) OR A paper copy in the mail If you have any lab test that is abnormal or we need to change your treatment, we will call you to review the results.   Testing/Procedures: Your physician has requested that you have an echocardiogram. Echocardiography is a painless test that uses sound waves to create images of your heart. It provides your doctor with information about the size and shape of your heart and how well your heart's chambers and valves are working. This procedure takes approximately one hour. There are no restrictions for this procedure.  Your physician has requested that you have a carotid duplex. This test is an ultrasound of the carotid arteries in your neck. It looks at blood flow through these arteries that supply the brain with blood. Allow one hour for this exam. There are no restrictions or special instructions.   Your physician has requested that you have cardiac CT. Cardiac computed tomography (CT) is a painless test that uses an x-ray machine to take clear, detailed pictures of your heart. For further information please visit [email or web address]. Please follow instruction sheet BELOW:    Your cardiac CT will be scheduled at one of the below locations:   [facility] [address] [place], [place] [address] [phone]  At [facility], please arrive at the [facility] main entrance (entrance A) of [facility] 30 minutes prior to test start time. You  can use the FREE valet parking offered at the main entrance (encouraged to control the heart rate for the test) Proceed to the [facility] Radiology Department (first floor) to check-in and test prep.   Please follow these instructions carefully (unless otherwise directed):  On the Night Before the Test: Be sure to Drink plenty of water. Do not consume any caffeinated/decaffeinated beverages or chocolate 12 hours prior to your test. Do not take any antihistamines 12 hours prior to your test.  On the Day of the Test: Drink plenty of water until 1 hour prior to the test. Do not eat any food 4 hours prior to the test. You may take your regular medications prior to the test.  Take metoprolol (Lopressor) 50 mg  two hours prior to test. This has been sent to your pharmacy.  DO NOT TAKE YOUR REGULAR METOPROLOL ON THIS DAY  FEMALES- please wear underwire-free bra if available, avoid dresses & tight clothing     After the Test: Drink plenty of water. After receiving IV contrast, you may experience a mild flushed feeling. This is normal. On occasion, you may experience a mild rash up to 24 hours after the test. This is not dangerous. If this occurs, you can take Benadryl 25 mg and increase your fluid intake. If you experience trouble breathing, this can be serious. If it is severe call 911 IMMEDIATELY. If it is mild, please call our office.   Please allow 2-4 weeks for scheduling of routine cardiac CTs. Some insurance companies require a pre-authorization which may delay scheduling of this  test.   For non-scheduling related questions, please contact the cardiac imaging nurse navigator should you have any questions/concerns: Marchia Bond, Cardiac Imaging Nurse Navigator Gordy Clement, Cardiac Imaging Nurse Navigator [facility] Direct Office Dial: [phone]   For scheduling needs, including cancellations and rescheduling, please call [place],  [phone].    Follow-Up: At [facility], you and your health needs are our priority.  As part of our continuing mission to provide you with exceptional heart care, we have created designated Provider Care Teams.  These Care Teams include your primary Cardiologist (physician) and Advanced Practice Providers (APPs -  Physician Assistants and Nurse Practitioners) who all work together to provide you with the care you need, when you need it.  We recommend signing up for the patient portal called "MyChart".  Sign up information is provided on this After Visit Summary.  MyChart is used to connect with patients for Virtual Visits (Telemedicine).  Patients are able to view lab/test results, encounter notes, upcoming appointments, etc.  Non-urgent messages can be sent to your provider as well.   To learn more about what you can do with MyChart, go to [email or web address].    Your next appointment:   AFTER TESTING WITH DAYNA DUNN, PA-C OR 1ST AVAILABLE APP   The format for your next appointment:   In Person  Provider:   Melina Copa, PA-C   Other Instructions

## 2021-01-17 LAB — COMPREHENSIVE METABOLIC PANEL
ALT: 10 IU/L (ref 0–32)
AST: 23 IU/L (ref 0–40)
Albumin/Globulin Ratio: 2.1 (ref 1.2–2.2)
Albumin: 4.7 g/dL (ref 3.8–4.8)
Alkaline Phosphatase: 75 IU/L (ref 44–121)
BUN/Creatinine Ratio: 14 (ref 12–28)
BUN: 15 mg/dL (ref 8–27)
Bilirubin Total: 0.2 mg/dL (ref 0.0–1.2)
CO2: 23 mmol/L (ref 20–29)
Calcium: 9.3 mg/dL (ref 8.7–10.3)
Chloride: 104 mmol/L (ref 96–106)
Creatinine, Ser: 1.08 mg/dL — ABNORMAL HIGH (ref 0.57–1.00)
Globulin, Total: 2.2 g/dL (ref 1.5–4.5)
Glucose: 89 mg/dL (ref 70–99)
Potassium: 4.2 mmol/L (ref 3.5–5.2)
Sodium: 142 mmol/L (ref 134–144)
Total Protein: 6.9 g/dL (ref 6.0–8.5)
eGFR: 56 mL/min/{1.73_m2} — ABNORMAL LOW (ref >59)

## 2021-01-17 LAB — PRO B NATRIURETIC PEPTIDE: NT-Pro BNP: 891 pg/mL — ABNORMAL HIGH (ref 0–301)

## 2021-01-17 LAB — CBC
Hematocrit: 39.8 % (ref 34.0–46.6)
Hemoglobin: 12.7 g/dL (ref 11.1–15.9)
MCH: 30.8 pg (ref 26.6–33.0)
MCHC: 31.9 g/dL (ref 31.5–35.7)
MCV: 97 fL (ref 79–97)
Platelets: 234 10*3/uL (ref 150–450)
RBC: 4.12 x10E6/uL (ref 3.77–5.28)
RDW: 14 % (ref 11.7–15.4)
WBC: 10 10*3/uL (ref 3.4–10.8)

## 2021-01-17 LAB — LIPID PANEL
Chol/HDL Ratio: 3 ratio (ref 0.0–4.4)
Cholesterol, Total: 193 mg/dL (ref 100–199)
HDL: 65 mg/dL (ref >39)
LDL Chol Calc (NIH): 99 mg/dL (ref 0–99)
Triglycerides: 169 mg/dL — ABNORMAL HIGH (ref 0–149)
VLDL Cholesterol Cal: 29 mg/dL (ref 5–40)

## 2021-01-17 LAB — TSH: TSH: 3.28 u[IU]/mL (ref 0.450–4.500)

## 2021-01-24 ENCOUNTER — Telehealth: Payer: Self-pay | Admitting: *Deleted

## 2021-01-24 DIAGNOSIS — Z79899 Other long term (current) drug therapy: Secondary | ICD-10-CM

## 2021-01-24 MED ORDER — ATORVASTATIN CALCIUM 40 MG PO TABS: 40.0000 mg | ORAL_TABLET | Freq: Every day | ORAL | 3 refills | Status: DC

## 2021-01-24 NOTE — Telephone Encounter (Signed)
-----   Message from Charlie Pitter, [place] sent at 01/17/2021  9:32 AM EDT ----- Please let patient know labs overall stable. Kidney function minimally abnormal but better than prior years. BNP (fluid marker) is up, but this too is better than it previously was. I will await echo and coronary CTA before advising whether we need to do anything on this level. Her cholesterol is not completely at goal, LDL 99 when we want it <70. Triglycerides slightly up as well.   Recommend increasing atorvastatin to 40mg  daily. Come fasting to her 03/19/21 OV so we can recheck lipids/CMET that day - OK to have water and black coffee that morning. I added FYI to the appt notes so that Selinda Eon is aware. Otherwise continue plan as discussed.

## 2021-01-25 ENCOUNTER — Telehealth (HOSPITAL_COMMUNITY): Payer: Self-pay | Admitting: *Deleted

## 2021-01-25 NOTE — Telephone Encounter (Signed)
Attempted to call patient regarding upcoming cardiac CT appointment. °Left message on voicemail with name and callback number ° °Nasirah Sachs RN Navigator Cardiac Imaging °[facility] °[phone] Office °[phone] Cell ° °

## 2021-01-28 ENCOUNTER — Ambulatory Visit (HOSPITAL_COMMUNITY)
Admission: RE | Admit: 2021-01-28 | Discharge: 2021-01-28 | Disposition: A | Discharge status: Home / Self Care | Payer: Medicare Other | Source: Ambulatory Visit | Attending: Physician Assistant | Admitting: Physician Assistant

## 2021-01-28 ENCOUNTER — Other Ambulatory Visit: Payer: Self-pay

## 2021-01-28 DIAGNOSIS — I2510 Atherosclerotic heart disease of native coronary artery without angina pectoris: Secondary | ICD-10-CM | POA: Diagnosis not present

## 2021-01-28 DIAGNOSIS — E785 Hyperlipidemia, unspecified: Secondary | ICD-10-CM | POA: Diagnosis not present

## 2021-01-28 DIAGNOSIS — I700 Atherosclerosis of aorta: Secondary | ICD-10-CM | POA: Diagnosis not present

## 2021-01-28 DIAGNOSIS — I5189 Other ill-defined heart diseases: Secondary | ICD-10-CM | POA: Insufficient documentation

## 2021-01-28 DIAGNOSIS — R0600 Dyspnea, unspecified: Secondary | ICD-10-CM | POA: Insufficient documentation

## 2021-01-28 DIAGNOSIS — Z01810 Encounter for preprocedural cardiovascular examination: Secondary | ICD-10-CM | POA: Diagnosis not present

## 2021-01-28 MED ORDER — IOHEXOL 350 MG/ML SOLN: 95.0000 mL | Freq: Once | INTRAVENOUS | Status: DC | PRN

## 2021-01-28 MED ORDER — IOHEXOL 350 MG/ML SOLN
100.0000 mL | Freq: Once | INTRAVENOUS | Status: AC | PRN
  Administered 2021-01-28: 100 mL via INTRAVENOUS

## 2021-01-28 MED ORDER — NITROGLYCERIN 0.4 MG SL SUBL
SUBLINGUAL_TABLET | SUBLINGUAL | Status: AC
  Filled 2021-01-28: qty 2

## 2021-01-28 MED ORDER — NITROGLYCERIN 0.4 MG SL SUBL
0.8000 mg | SUBLINGUAL_TABLET | Freq: Once | SUBLINGUAL | Status: AC
  Administered 2021-01-28: 0.8 mg via SUBLINGUAL

## 2021-01-29 ENCOUNTER — Ambulatory Visit (HOSPITAL_COMMUNITY)
Admission: RE | Admit: 2021-01-29 | Discharge: 2021-01-29 | Disposition: A | Discharge status: Home / Self Care | Payer: Medicare Other | Source: Ambulatory Visit | Attending: Cardiovascular Disease | Admitting: Cardiovascular Disease

## 2021-01-29 DIAGNOSIS — E785 Hyperlipidemia, unspecified: Secondary | ICD-10-CM

## 2021-01-29 DIAGNOSIS — I2510 Atherosclerotic heart disease of native coronary artery without angina pectoris: Secondary | ICD-10-CM

## 2021-01-29 DIAGNOSIS — Z01810 Encounter for preprocedural cardiovascular examination: Secondary | ICD-10-CM

## 2021-01-29 DIAGNOSIS — R0989 Other specified symptoms and signs involving the circulatory and respiratory systems: Secondary | ICD-10-CM | POA: Diagnosis not present

## 2021-01-29 DIAGNOSIS — R0600 Dyspnea, unspecified: Secondary | ICD-10-CM

## 2021-01-29 DIAGNOSIS — I5189 Other ill-defined heart diseases: Secondary | ICD-10-CM | POA: Diagnosis not present

## 2021-01-30 ENCOUNTER — Telehealth: Payer: Self-pay | Admitting: *Deleted

## 2021-01-30 DIAGNOSIS — Z79899 Other long term (current) drug therapy: Secondary | ICD-10-CM

## 2021-01-30 NOTE — Telephone Encounter (Signed)
-----   Message from Charlie Pitter, [place] sent at 01/29/2021  1:29 PM EST ----- Please let patient know coronary CTA overall looked OK. There is evidence of mild coronary artery disease and plaque in aorta but no obstructions/blocking of flow. She has a small hole in her heart called a PFO - this is something she was born with - given no history of stroke, we do not need to do anything special for this other than acknowledge that we know about it. I would suggest repeating her BMET in 4-5 days to make sure it's stable post CT scan. Otherwise continue plan as outlined with carotid study, echo. Once final studies are complete we can make final recommendations about surgery.

## 2021-02-01 ENCOUNTER — Telehealth: Payer: Self-pay | Admitting: Cardiovascular Disease

## 2021-02-01 ENCOUNTER — Other Ambulatory Visit: Payer: Medicare Other | Admitting: *Deleted

## 2021-02-01 ENCOUNTER — Other Ambulatory Visit: Payer: Self-pay

## 2021-02-01 ENCOUNTER — Telehealth: Payer: Self-pay | Admitting: *Deleted

## 2021-02-01 DIAGNOSIS — I739 Peripheral vascular disease, unspecified: Secondary | ICD-10-CM

## 2021-02-01 DIAGNOSIS — R0989 Other specified symptoms and signs involving the circulatory and respiratory systems: Secondary | ICD-10-CM

## 2021-02-01 DIAGNOSIS — Z79899 Other long term (current) drug therapy: Secondary | ICD-10-CM | POA: Diagnosis not present

## 2021-02-01 NOTE — Telephone Encounter (Signed)
-----   Message from Charlie Pitter, PA-C sent at 01/31/2021 10:00 AM EST ----- Please let patient know carotid duplex shows mild plaque in the left carotid artery and somewhat disturbed flow in the left subclavian artery- any symptoms of dizziness, nausea, pre-fainting when lifting left arm over head? Can she check her BP in both arms and let us know if there is a difference between the two? If no significant symptoms like this and BP is near equal, nothing to do at this time aside from watch for symptoms, otherwise if there are symptoms, need to refer to [facility].

## 2021-02-01 NOTE — Telephone Encounter (Signed)
Called pt and informed her that MD reviewed BP readings and replied look good.  Pt had no questions or concerns.

## 2021-02-01 NOTE — Telephone Encounter (Signed)
Patient is sending in her blood pressure reading from request:  122/60; 57 151/75; 58 122/72; 59 141/70; 58 145/74; 56 136/77; 58

## 2021-02-01 NOTE — Telephone Encounter (Signed)
Pt is returning call for results 

## 2021-02-02 LAB — BASIC METABOLIC PANEL
BUN/Creatinine Ratio: 18 (ref 12–28)
BUN: 20 mg/dL (ref 8–27)
CO2: 22 mmol/L (ref 20–29)
Calcium: 9.3 mg/dL (ref 8.7–10.3)
Chloride: 108 mmol/L — ABNORMAL HIGH (ref 96–106)
Creatinine, Ser: 1.09 mg/dL — ABNORMAL HIGH (ref 0.57–1.00)
Glucose: 79 mg/dL (ref 70–99)
Potassium: 4.7 mmol/L (ref 3.5–5.2)
Sodium: 146 mmol/L — ABNORMAL HIGH (ref 134–144)
eGFR: 55 mL/min/{1.73_m2} — ABNORMAL LOW (ref >59)

## 2021-02-09 ENCOUNTER — Ambulatory Visit
Admission: RE | Admit: 2021-02-09 | Discharge: 2021-02-09 | Disposition: A | Discharge status: Home / Self Care | Payer: Medicare Other | Source: Ambulatory Visit | Attending: Student | Admitting: Student

## 2021-02-09 ENCOUNTER — Other Ambulatory Visit: Payer: Self-pay

## 2021-02-09 DIAGNOSIS — M4712 Other spondylosis with myelopathy, cervical region: Secondary | ICD-10-CM

## 2021-02-09 DIAGNOSIS — M4802 Spinal stenosis, cervical region: Secondary | ICD-10-CM | POA: Diagnosis not present

## 2021-02-12 ENCOUNTER — Other Ambulatory Visit: Payer: Self-pay

## 2021-02-12 ENCOUNTER — Ambulatory Visit (HOSPITAL_COMMUNITY): Payer: Medicare Other | Attending: Cardiology

## 2021-02-12 DIAGNOSIS — Z01810 Encounter for preprocedural cardiovascular examination: Secondary | ICD-10-CM

## 2021-02-12 DIAGNOSIS — I5189 Other ill-defined heart diseases: Secondary | ICD-10-CM | POA: Diagnosis not present

## 2021-02-12 DIAGNOSIS — I2510 Atherosclerotic heart disease of native coronary artery without angina pectoris: Secondary | ICD-10-CM | POA: Diagnosis not present

## 2021-02-12 DIAGNOSIS — R0600 Dyspnea, unspecified: Secondary | ICD-10-CM

## 2021-02-12 DIAGNOSIS — E785 Hyperlipidemia, unspecified: Secondary | ICD-10-CM

## 2021-02-12 LAB — ECHOCARDIOGRAM COMPLETE
Area-P 1/2: 3.5 cm2
S' Lateral: 2.7 cm

## 2021-02-18 NOTE — Progress Notes (Signed)
Cardiology Office Note    Date:  02/19/2021   ID:  Maureen Chung, DOB 17-May-1950, MRN [id number]  PCP:  London Pepper, MD  Cardiologist:  Jenkins Rouge, MD  Electrophysiologist:  None   Chief Complaint: preop, also reports recent shortness of breath  History of Present Illness:   Maureen Chung is a 70 y.o. female with history of tobacco use, minimal CAD by cath 2017, HLD (followed by PCP), possible CKD stage III (borderline a-b), hyperparathyroidism, anxiety, arthritis, pulmonary nodules who presents for follow-up.   False positive myovue  with cath 10/2015 showing only 20% LAD otherwise normal. She also has prior history of dyspnea/fatigue with elevated BNP prompting Lasix in 06/979 (? Diastolic CHF).  Echo 10/2018 EF >65%, impaired diastolic relaxation, otherwise no significant abnormalities. She has benign pulmonary nodule stable by CT 05/2020   She was seen in the ED 04/2020 for near-syncope with workup felt reassuring. She reports this occurred in the setting of a hot shower and she couldn't catch her breath. Her WBC was elevated with recommendation for f/u primary care.  She returns for preop evaluation for cervical fusion anticipated in January. Has chronic dyspnea that seems non cardiac TTE done 02/12/21 EF 59% grade one diastolic no pulmonary HTN mild MR BNP elevated 891 BUN 20 Cr 1.1 K 4.7   No dyspnea No longer needing lasix Quit smoking a year ago No chest pain Surgical date with Dr Arnoldo Morale 03/21/21    Past Medical History:  Diagnosis Date   Anxiety    Arthritis    Chronic kidney disease, stage 3 (HCC)    Diastolic dysfunction    Hypercalcemia    Hyperlipidemia    Hyperparathyroidism    Insomnia    Mild CAD    Osteoporosis    Pericarditis     Past Surgical History:  Procedure Laterality Date   BREAST CYST EXCISION  1997   benign   CARDIAC CATHETERIZATION N/A 10/18/2015   Procedure: Left Heart Cath and Coronary Angiography;  Surgeon: Josue Hector, MD;   Location: [facility];  Service: Cardiovascular;  Laterality: N/A;   child birth  [place]  [birth year]   TONSILECTOMY, ADENOIDECTOMY, BILATERAL MYRINGOTOMY [place]   without ovaries    Current Medications: Current Meds  Medication Sig   acetaminophen (TYLENOL) 325 MG tablet Take 650 mg by mouth every 6 (six) hours as needed for mild pain.   albuterol (VENTOLIN HFA) 108 (90 Base) MCG/ACT inhaler Inhale 2 puffs into the lungs every 6 (six) hours as needed for wheezing or shortness of breath.   atorvastatin (LIPITOR) 40 MG tablet Take 1 tablet (40 mg total) by mouth daily.   baclofen (LIORESAL) 10 MG tablet Take 1/2 to 1 by mouth every 8hrs as needed for spasm (Patient taking differently: Take 5-10 mg by mouth 3 (three) times daily. Take 1/2 to 1 by mouth every 8hrs as needed for spasm)   budesonide-formoterol (SYMBICORT) 160-4.5 MCG/ACT inhaler Inhale 2 puffs into the lungs 2 (two) times daily.   cyclobenzaprine (FLEXERIL) 10 MG tablet TAKE 1 TABLET THREE TIMES DAILY AS NEEDED FOR MUSCLE SPASMS. (Patient taking differently: Take 10 mg by mouth 3 (three) times daily as needed for muscle spasms. TAKE 1 TABLET THREE TIMES DAILY AS NEEDED FOR MUSCLE SPASMS.)   fluticasone (CUTIVATE) 0.05 % cream daily.   LUMIGAN 0.01 % SOLN 1 drop  at bedtime.   Magnesium Oxide (MAG-OXIDE PO) Take 1 tablet by mouth daily.   metoprolol succinate (TOPROL-XL) 25 MG 24 hr tablet Take 25 mg by mouth daily.   metroNIDAZOLE (METROCREAM) 0.75 % cream daily.   Multiple Vitamins-Minerals (PRESERVISION AREDS 2) CAPS Take 2 capsules by mouth daily.   nitroGLYCERIN (NITROSTAT) 0.4 MG SL tablet Place 1 tablet (0.4 mg total) under the tongue every 5 (five) minutes as needed for chest pain.   pantoprazole (PROTONIX) 40 MG tablet Take 40 mg by mouth daily.   sertraline (ZOLOFT) 100 MG tablet Take 200 mg by mouth daily.    tacrolimus (PROTOPIC) 0.1 % ointment Apply 1 application topically at bedtime. Bedtime   triamcinolone cream (KENALOG) 0.1 % See admin instructions.   zoledronic acid (RECLAST) 5 MG/100ML SOLN injection See admin instructions.     Allergies:   Latex   Social History   Socioeconomic History   Marital status: Married    Spouse name: Not on file   Number of children: Not on file   Years of education: Not on file   Highest education level: Not on file  Occupational History   Not on file  Tobacco Use   Smoking status: Former    Packs/day: 1.00    Years: 41.00    Pack years: 41.00    Types: Cigarettes    Quit date: 11/12/2019    Years since quitting: 1.2   Smokeless tobacco: Never   Tobacco comments:    Encouraged to develop a plan or come to me for assistance when ready to set a date.  Vaping Use   Vaping Use: Never used  Substance and Sexual Activity   Alcohol use: Yes    Alcohol/week: 0.0 standard drinks    Comment: once per day   Drug use: No   Sexual activity: Not on file  Other Topics Concern   Not on file  Social History Narrative   Not on file   Social Determinants of Health   Financial Resource Strain: Not on file  Food Insecurity: Not on file  Transportation Needs: Not on file  Physical Activity: Not on file  Stress: Not on file  Social Connections: Not on file     Family History:  The patient's family history includes Bladder Cancer in her brother; Cancer in her mother; Coronary artery disease in her brother; Diabetes in her father; Heart disease in her brother and father.  ROS:   Please see the history of present illness. Otherwise, review of systems is positive for noticing lower BP after taking cumin seed supplement. We discussed it is difficult to know if this is safe since it is uncontrolled suppplement and no trials. All other systems are reviewed and otherwise negative.    EKGs/Labs/Other Studies Reviewed:    Studies reviewed are outlined and  summarized above. Reports included below if pertinent.  2D echo  02/12/21  eft ventricular ejection fraction by 3D volume is 59 %. The left ventricle has normal function. The left ventricle has no regional wall motion abnormalities. Left ventricular diastolic parameters are consistent with Grade I diastolic dysfunction (impaired relaxation). The average left ventricular global longitudinal strain is -20.4 %. The global longitudinal strain is normal. 1. 2. Right ventricular systolic function is normal. The right ventricular size is normal. The mitral valve is normal in structure. Mild mitral valve regurgitation. No evidence of mitral stenosis. 3. The aortic valve is normal in structure. Aortic valve regurgitation is not visualized. No aortic stenosis is  present. 4. The inferior vena cava is normal in size with  Cath 10/2015 Prox LAD to Mid LAD lesion, 20 %stenosed.   No significant CAD Mild calcification of proximal LAD Normal EF 65%    EKG:  EKG is ordered today, personally reviewed, demonstrating NSR 60bpm no acute change from prior  Recent Labs: 01/16/2021: ALT 10; Hemoglobin 12.7; NT-Pro BNP 891; Platelets 234; TSH 3.280 02/01/2021: BUN 20; Creatinine, Ser 1.09; Potassium 4.7; Sodium 146  Recent Lipid Panel    Component Value Date/Time   CHOL 193 01/16/2021 1445   TRIG 169 (H) 01/16/2021 1445   HDL 65 01/16/2021 1445   CHOLHDL 3.0 01/16/2021 1445   LDLCALC 99 01/16/2021 1445    PHYSICAL EXAM:    VS:  BP 138/68   Pulse 66   Ht 5\' 4"  (1.626 m)   Wt 133 lb (60.3 kg)   SpO2 98%   BMI 22.83 kg/m   BMI: Body mass index is 22.83 kg/m.  Affect appropriate Healthy:  appears stated age 39: normal Neck supple with no adenopathy JVP normal  bilateral bruits no thyromegaly Lungs clear with no wheezing and good diaphragmatic motion Heart:  S1/S2 SEM murmur, no rub, gallop or click PMI normal Abdomen: benighn, BS positve, no tenderness, no AAA no bruit.  No HSM or  HJR Left subclavian bruit  No edema Neuro non-focal Skin warm and dry No muscular weakness   Wt Readings from Last 3 Encounters:  02/19/21 133 lb (60.3 kg)  01/16/21 133 lb 3.2 oz (60.4 kg)  05/13/20 130 lb 4.7 oz (59.1 kg)     ASSESSMENT & PLAN:   1. Dyspnea - continue  PRN lasix for elevated BNP but echo normal EF mild MR and only diastolic relaxation abnormality Previous smoker likely some lung dx  2. Pre-op evaluation - clear to have anterior cervical fusion with Dr Arnoldo Morale 03/21/21   3. Coronary artery disease, mild - cardiac CTA 01/28/21 calcium score 116 CAD RADS 2 25-49% non obstructive dx in proximal LAD and mid circumflex   4. Hyperlipidemia - LDL 99 continue statin   5. Right carotid bruit - plaque no stenosis by duplex 01/29/21   6. Left Subclavian Stenosis: no steal symptoms or arm weakness Take BP in right arm Follow     Disposition: F/u with PA/ me in  6 months    Medication Adjustments/Labs and Tests Ordered: Current medicines are reviewed at length with the patient today.  Concerns regarding medicines are outlined above. Medication changes, Labs and Tests ordered today are summarized above and listed in the Patient Instructions accessible in Encounters.   Signed, Jenkins Rouge, MD  02/19/2021 10:46 AM    [facility] [place], [place], [place]  [address] Phone: [phone]; Fax: [phone]

## 2021-02-19 ENCOUNTER — Other Ambulatory Visit: Payer: Self-pay

## 2021-02-19 ENCOUNTER — Ambulatory Visit (INDEPENDENT_AMBULATORY_CARE_PROVIDER_SITE_OTHER): Payer: Medicare Other | Admitting: Cardiovascular Disease

## 2021-02-19 ENCOUNTER — Encounter: Payer: Self-pay | Admitting: Cardiovascular Disease

## 2021-02-19 VITALS — BP 138/68 | HR 66 | Ht 64.0 in | Wt 133.0 lb

## 2021-02-19 DIAGNOSIS — I10 Essential (primary) hypertension: Secondary | ICD-10-CM

## 2021-02-19 DIAGNOSIS — I2510 Atherosclerotic heart disease of native coronary artery without angina pectoris: Secondary | ICD-10-CM | POA: Diagnosis not present

## 2021-02-19 DIAGNOSIS — R0989 Other specified symptoms and signs involving the circulatory and respiratory systems: Secondary | ICD-10-CM

## 2021-02-19 DIAGNOSIS — Z01810 Encounter for preprocedural cardiovascular examination: Secondary | ICD-10-CM

## 2021-02-19 DIAGNOSIS — R0602 Shortness of breath: Secondary | ICD-10-CM

## 2021-02-19 MED ORDER — FUROSEMIDE 20 MG PO TABS: 20.0000 mg | ORAL_TABLET | Freq: Every day | ORAL | 3 refills | Status: DC | PRN

## 2021-02-19 NOTE — Patient Instructions (Signed)
Medication Instructions:  Your physician has recommended you make the following change in your medication:  START: furosemide (Lasix) 20 mg by mouth daily as needed for swelling  *If you need a refill on your cardiac medications before your next appointment, please call your pharmacy*   Lab Work: NONE If you have labs (blood work) drawn today and your tests are completely normal, you will receive your results only by: [place] (if you have MyChart) OR A paper copy in the mail If you have any lab test that is abnormal or we need to change your treatment, we will call you to review the results.   Testing/Procedures: NONE   Follow-Up: At [facility], you and your health needs are our priority.  As part of our continuing mission to provide you with exceptional heart care, we have created designated Provider Care Teams.  These Care Teams include your primary Cardiologist (physician) and Advanced Practice Providers (APPs -  Physician Assistants and Nurse Practitioners) who all work together to provide you with the care you need, when you need it.   Your next appointment:   6 month(s)  The format for your next appointment:   In Person  Provider:   Jenkins Rouge, MD  or Robbie Lis, PA-C or Richardson Dopp, [place]

## 2021-02-26 ENCOUNTER — Ambulatory Visit (INDEPENDENT_AMBULATORY_CARE_PROVIDER_SITE_OTHER): Payer: Medicare Other | Admitting: Cardiovascular Disease

## 2021-02-26 ENCOUNTER — Encounter: Payer: Self-pay | Admitting: Cardiovascular Disease

## 2021-02-26 ENCOUNTER — Other Ambulatory Visit: Payer: Self-pay

## 2021-02-26 VITALS — BP 142/64 | HR 73 | Ht 64.0 in | Wt 131.0 lb

## 2021-02-26 DIAGNOSIS — I2510 Atherosclerotic heart disease of native coronary artery without angina pectoris: Secondary | ICD-10-CM

## 2021-02-26 DIAGNOSIS — E785 Hyperlipidemia, unspecified: Secondary | ICD-10-CM | POA: Diagnosis not present

## 2021-02-26 DIAGNOSIS — I771 Stricture of artery: Secondary | ICD-10-CM

## 2021-02-26 DIAGNOSIS — R0989 Other specified symptoms and signs involving the circulatory and respiratory systems: Secondary | ICD-10-CM

## 2021-02-26 NOTE — Progress Notes (Signed)
Cardiology Office Note   Date:  02/26/2021   ID:  Maureen Chung, DOB 05/14/1950, MRN [id number]  PCP:  London Pepper, MD  Cardiologist:  Dr. Johnsie Cancel  No chief complaint on file.     History of Present Illness: Maureen Chung is a 70 y.o. female who was referred by Melina Copa for evaluation of left subclavian artery stenosis. She has known history of tobacco use, minimal coronary artery disease by cath in 2017, hyperlipidemia, stage III chronic kidney disease, anxiety and arthritis. The patient was found to have right carotid bruit. Carotid Doppler was done in November which showed mild nonobstructive carotid disease bilaterally.  Right vertebral artery had antegrade flow.  Left vertebral artery had atypical flow with evidence of left subclavian stenosis. She reports bilateral arm discomfort mostly in the joints.  No significant muscle pain with exertion.  No nausea, vomiting or dizziness when using left arm. No chest pain at the present time and she has stable exertional dyspnea.  She did have syncope in the past with negative work-up.  Past Medical History:  Diagnosis Date   Anxiety    Arthritis    Chronic kidney disease, stage 3 (HCC)    Diastolic dysfunction    Hypercalcemia    Hyperlipidemia    Hyperparathyroidism    Insomnia    Mild CAD    Osteoporosis    Pericarditis     Past Surgical History:  Procedure Laterality Date   BREAST CYST EXCISION  1997   benign   CARDIAC CATHETERIZATION N/A 10/18/2015   Procedure: Left Heart Cath and Coronary Angiography;  Surgeon: Josue Hector, MD;  Location: [facility];  Service: Cardiovascular;  Laterality: N/A;   child birth  [place]  [birth year]   TONSILECTOMY, ADENOIDECTOMY, BILATERAL MYRINGOTOMY [place]   without ovaries     Current Outpatient Medications  Medication Sig Dispense Refill   acetaminophen  (TYLENOL) 325 MG tablet Take 650 mg by mouth every 6 (six) hours as needed for mild pain.     albuterol (VENTOLIN HFA) 108 (90 Base) MCG/ACT inhaler Inhale 2 puffs into the lungs every 6 (six) hours as needed for wheezing or shortness of breath. 6.7 g 3   aspirin 81 MG EC tablet Take 1 tablet by mouth daily.     atorvastatin (LIPITOR) 40 MG tablet Take 1 tablet (40 mg total) by mouth daily. 90 tablet 3   baclofen (LIORESAL) 10 MG tablet Take 1/2 to 1 by mouth every 8hrs as needed for spasm (Patient taking differently: Take 5-10 mg by mouth 3 (three) times daily. Take 1/2 to 1 by mouth every 8hrs as needed for spasm) 60 tablet 1   budesonide-formoterol (SYMBICORT) 160-4.5 MCG/ACT inhaler Inhale 2 puffs into the lungs 2 (two) times daily. 10.2 g 4   cyclobenzaprine (FLEXERIL) 10 MG tablet TAKE 1 TABLET THREE TIMES DAILY AS NEEDED FOR MUSCLE SPASMS. (Patient taking differently: Take 10 mg by mouth 3 (three) times daily as needed for muscle spasms. TAKE 1 TABLET THREE TIMES DAILY AS NEEDED FOR MUSCLE SPASMS.) 30 tablet 0   fluticasone (CUTIVATE) 0.05 % cream daily.     furosemide (LASIX) 20 MG tablet Take 1 tablet (20 mg total) by mouth daily as needed for edema. 30 tablet 3   LUMIGAN 0.01 % SOLN 1 drop at bedtime.     Magnesium Oxide (MAG-OXIDE PO)  Take 1 tablet by mouth daily.     metoprolol succinate (TOPROL-XL) 25 MG 24 hr tablet Take 25 mg by mouth daily.     metroNIDAZOLE (METROCREAM) 0.75 % cream daily.     Multiple Vitamins-Minerals (PRESERVISION AREDS 2) CAPS Take 2 capsules by mouth daily.     nitroGLYCERIN (NITROSTAT) 0.4 MG SL tablet Place 1 tablet (0.4 mg total) under the tongue every 5 (five) minutes as needed for chest pain. 25 tablet 3   pantoprazole (PROTONIX) 40 MG tablet Take 40 mg by mouth daily.     sertraline (ZOLOFT) 100 MG tablet Take 200 mg by mouth daily.     tacrolimus (PROTOPIC) 0.1 % ointment Apply 1 application topically at bedtime. Bedtime     triamcinolone cream  (KENALOG) 0.1 % See admin instructions.     zoledronic acid (RECLAST) 5 MG/100ML SOLN injection See admin instructions.     No current facility-administered medications for this visit.    Allergies:   Latex    Social History:  The patient  reports that she quit smoking about 15 months ago. Her smoking use included cigarettes. She has a 41.00 pack-year smoking history. She has never used smokeless tobacco. She reports current alcohol use. She reports that she does not use drugs.   Family History:  The patient's family history includes Bladder Cancer in her brother; Cancer in her mother; Coronary artery disease in her brother; Diabetes in her father; Heart disease in her brother and father.    ROS:  Please see the history of present illness.   Otherwise, review of systems are positive for none.   All other systems are reviewed and negative.    PHYSICAL EXAM: VS:  BP (!) 142/64    Pulse 73    Ht 5\' 4"  (1.626 m)    Wt 131 lb (59.4 kg)    SpO2 97%    BMI 22.49 kg/m  , BMI Body mass index is 22.49 kg/m. GEN: Well nourished, well developed, in no acute distress  HEENT: normal  Neck: no JVD or masses.  Right carotid bruit. Cardiac: RRR; no murmurs, rubs, or gallops,no edema  Respiratory:  clear to auscultation bilaterally, normal work of breathing GI: soft, nontender, nondistended, + BS MS: no deformity or atrophy  Skin: warm and dry, no rash Neuro:  Strength and sensation are intact Psych: euthymic mood, full affect Vascular: Radial pulse is normal bilaterally.  Brachial pulse is normal bilaterally.   EKG:  EKG is not ordered today.    Recent Labs: 01/16/2021: ALT 10; Hemoglobin 12.7; NT-Pro BNP 891; Platelets 234; TSH 3.280 02/01/2021: BUN 20; Creatinine, Ser 1.09; Potassium 4.7; Sodium 146    Lipid Panel    Component Value Date/Time   CHOL 193 01/16/2021 1445   TRIG 169 (H) 01/16/2021 1445   HDL 65 01/16/2021 1445   CHOLHDL 3.0 01/16/2021 1445   LDLCALC 99 01/16/2021 1445       Wt Readings from Last 3 Encounters:  02/26/21 131 lb (59.4 kg)  02/19/21 133 lb (60.3 kg)  01/16/21 133 lb 3.2 oz (60.4 kg)       No flowsheet data found.    ASSESSMENT AND PLAN:  1.  Left subclavian artery stenosis: This does not seem to be associated with significant obstruction to flow given that there was only 10 mm systolic gradient between the right arm and the left arm.  In addition, her left arm pulses are within the normal range.  She does have bilateral arm  discomfort that seems to be arthritic and/or neuropathic.  No convincing evidence of claudication.  Thus, no indication for revascularization.  Continue treatment of risk factors. Her blood pressure should be always checked in the right arm which is more reflective of central aortic pressure.  2.  Mild nonobstructive coronary artery disease: No angina at the present time.  3.  Hyperlipidemia: Currently on atorvastatin with a target LDL of less than 70.    Disposition:   FU with me as needed  Signed,  Kathlyn Sacramento, MD  02/26/2021 11:21 AM    [place]

## 2021-02-26 NOTE — Patient Instructions (Signed)

## 2021-03-14 NOTE — Progress Notes (Signed)
Surgical Instructions    Your procedure is scheduled on Thursday, January 5th, 2023.   Report to [facility] Main Entrance "A" at 05:30 A.M., then check in with the Admitting office.  Call this number if you have problems the morning of surgery:  [phone]   If you have any questions prior to your surgery date call [phone]: Open Monday-Friday 8am-4pm    Remember:  Do not eat or drink after midnight the night before your surgery    Take these medicines the morning of surgery with A SIP OF WATER:  atorvastatin (LIPITOR) metoprolol succinate (TOPROL-XL) pantoprazole (PROTONIX) sertraline (ZOLOFT)  If needed:  acetaminophen (TYLENOL)  albuterol (VENTOLIN HFA) baclofen (LIORESAL) budesonide-formoterol (SYMBICORT) cyclobenzaprine (FLEXERIL) nitroGLYCERIN (NITROSTAT) tetrahydrozoline-zinc (RELIEF EYE DROPS)  Please bring all inhalers with you the day of surgery.    Follow your surgeon's instructions on when to stop Aspirin.  If no instructions were given by your surgeon then you will need to call the office to get those instructions.     As of today, STOP taking any Aspirin (unless otherwise instructed by your surgeon) Aleve, Naproxen, Ibuprofen, Motrin, Advil, Goody's, BC's, all herbal medications, fish oil, and all vitamins.   After your COVID test   You are not required to quarantine however you are required to wear a well-fitting mask when you are out and around people not in your household.  If your mask becomes wet or soiled, replace with a new one.  Wash your hands often with soap and water for 20 seconds or clean your hands with an alcohol-based hand sanitizer that contains at least 60% alcohol.  Do not share personal items.  Notify your provider: if you are in close contact with someone who has COVID  or if you develop a fever of 100.4 or greater, sneezing, cough, sore throat, shortness of breath or body aches.   The day of surgery:          Do not wear  jewelry or makeup Do not wear lotions, powders, perfumes, or deodorant. Do not shave 48 hours prior to surgery.   Do not bring valuables to the hospital. DO Not wear nail polish, gel polish, artificial nails, or any other type of covering on natural nails including finger and toenails. If patients have artificial nails, gel coating, etc. that need to be removed by a nail salon, please have this removed prior to surgery or surgery may need to be canceled/delayed if the surgeon/ anesthesia feels like the patient is unable to be adequately monitored.               is not responsible for any belongings or valuables.  Do NOT Smoke (Tobacco/Vaping)  24 hours prior to your procedure  If you use a CPAP at night, you may bring your mask for your overnight stay.   Contacts, glasses, hearing aids, dentures or partials may not be worn into surgery, please bring cases for these belongings   For patients admitted to the hospital, discharge time will be determined by your treatment team.   Patients discharged the day of surgery will not be allowed to drive home, and someone needs to stay with them for 24 hours.  NO VISITORS WILL BE ALLOWED IN PRE-OP WHERE PATIENTS ARE PREPPED FOR SURGERY.  ONLY 1 SUPPORT PERSON MAY BE PRESENT IN THE WAITING ROOM WHILE YOU ARE IN SURGERY.  IF YOU ARE TO BE ADMITTED, ONCE YOU ARE IN YOUR ROOM YOU WILL BE ALLOWED TWO (2) VISITORS.  1 (ONE) VISITOR MAY STAY OVERNIGHT BUT MUST ARRIVE TO THE ROOM BY 8pm.  Minor children may have two parents present. Special consideration for safety and communication needs will be reviewed on a case by case basis.  Special instructions:    Oral Hygiene is also important to reduce your risk of infection.  Remember - BRUSH YOUR TEETH THE MORNING OF SURGERY WITH YOUR REGULAR TOOTHPASTE   [place]- Preparing For Surgery  Before surgery, you can play an important role. Because skin is not sterile, your skin needs to be as free of  germs as possible. You can reduce the number of germs on your skin by washing with CHG (chlorahexidine gluconate) Soap before surgery.  CHG is an antiseptic cleaner which kills germs and bonds with the skin to continue killing germs even after washing.     Please do not use if you have an allergy to CHG or antibacterial soaps. If your skin becomes reddened/irritated stop using the CHG.  Do not shave (including legs and underarms) for at least 48 hours prior to first CHG shower. It is OK to shave your face.  Please follow these instructions carefully.     Shower the NIGHT BEFORE SURGERY and the MORNING OF SURGERY with CHG Soap.   If you chose to wash your hair, wash your hair first as usual with your normal shampoo. After you shampoo, rinse your hair and body thoroughly to remove the shampoo.  Then [employer] and genitals (private parts) with your normal soap and rinse thoroughly to remove soap.  After that Use CHG Soap as you would any other liquid soap. You can apply CHG directly to the skin and wash gently with a scrungie or a clean washcloth.   Apply the CHG Soap to your body ONLY FROM THE NECK DOWN.  Do not use on open wounds or open sores. Avoid contact with your eyes, ears, mouth and genitals (private parts). Wash Face and genitals (private parts)  with your normal soap.   Wash thoroughly, paying special attention to the area where your surgery will be performed.  Thoroughly rinse your body with warm water from the neck down.  DO NOT shower/wash with your normal soap after using and rinsing off the CHG Soap.  Pat yourself dry with a CLEAN TOWEL.  Wear CLEAN PAJAMAS to bed the night before surgery  Place CLEAN SHEETS on your bed the night before your surgery  DO NOT SLEEP WITH PETS.   Day of Surgery:  Take a shower with CHG soap. Wear Clean/Comfortable clothing the morning of surgery Do not apply any deodorants/lotions.   Remember to brush your teeth WITH YOUR REGULAR  TOOTHPASTE.   Please read over the following fact sheets that you were given.

## 2021-03-19 ENCOUNTER — Ambulatory Visit: Payer: Medicare Other | Admitting: Physician Assistant

## 2021-03-19 ENCOUNTER — Other Ambulatory Visit: Payer: Self-pay

## 2021-03-19 ENCOUNTER — Other Ambulatory Visit: Payer: Self-pay | Admitting: Neurosurgery

## 2021-03-19 ENCOUNTER — Encounter (HOSPITAL_COMMUNITY): Payer: Self-pay

## 2021-03-19 ENCOUNTER — Encounter (HOSPITAL_COMMUNITY)
Admission: RE | Admit: 2021-03-19 | Discharge: 2021-03-19 | Disposition: A | Discharge status: Home / Self Care | Payer: Medicare Other | Source: Ambulatory Visit | Attending: Neurosurgery | Admitting: Neurosurgery

## 2021-03-19 VITALS — BP 168/69 | HR 58 | Temp 98.0°F | Resp 18 | Ht 64.0 in | Wt 133.8 lb

## 2021-03-19 DIAGNOSIS — N183 Chronic kidney disease, stage 3 unspecified: Secondary | ICD-10-CM | POA: Diagnosis not present

## 2021-03-19 DIAGNOSIS — Z90710 Acquired absence of both cervix and uterus: Secondary | ICD-10-CM | POA: Diagnosis not present

## 2021-03-19 DIAGNOSIS — Z20822 Contact with and (suspected) exposure to covid-19: Secondary | ICD-10-CM | POA: Diagnosis not present

## 2021-03-19 DIAGNOSIS — I10 Essential (primary) hypertension: Secondary | ICD-10-CM | POA: Diagnosis not present

## 2021-03-19 DIAGNOSIS — Z01818 Encounter for other preprocedural examination: Secondary | ICD-10-CM

## 2021-03-19 DIAGNOSIS — Z01812 Encounter for preprocedural laboratory examination: Secondary | ICD-10-CM | POA: Insufficient documentation

## 2021-03-19 DIAGNOSIS — E213 Hyperparathyroidism, unspecified: Secondary | ICD-10-CM | POA: Diagnosis not present

## 2021-03-19 DIAGNOSIS — I129 Hypertensive chronic kidney disease with stage 1 through stage 4 chronic kidney disease, or unspecified chronic kidney disease: Secondary | ICD-10-CM | POA: Diagnosis not present

## 2021-03-19 DIAGNOSIS — Z8249 Family history of ischemic heart disease and other diseases of the circulatory system: Secondary | ICD-10-CM | POA: Diagnosis not present

## 2021-03-19 DIAGNOSIS — M4712 Other spondylosis with myelopathy, cervical region: Secondary | ICD-10-CM | POA: Diagnosis not present

## 2021-03-19 DIAGNOSIS — M5001 Cervical disc disorder with myelopathy,  high cervical region: Secondary | ICD-10-CM | POA: Diagnosis not present

## 2021-03-19 DIAGNOSIS — M4722 Other spondylosis with radiculopathy, cervical region: Secondary | ICD-10-CM | POA: Diagnosis not present

## 2021-03-19 DIAGNOSIS — M4322 Fusion of spine, cervical region: Secondary | ICD-10-CM | POA: Diagnosis not present

## 2021-03-19 DIAGNOSIS — I2510 Atherosclerotic heart disease of native coronary artery without angina pectoris: Secondary | ICD-10-CM | POA: Diagnosis not present

## 2021-03-19 DIAGNOSIS — Z79899 Other long term (current) drug therapy: Secondary | ICD-10-CM | POA: Diagnosis not present

## 2021-03-19 DIAGNOSIS — R0600 Dyspnea, unspecified: Secondary | ICD-10-CM | POA: Insufficient documentation

## 2021-03-19 DIAGNOSIS — Z7982 Long term (current) use of aspirin: Secondary | ICD-10-CM | POA: Diagnosis not present

## 2021-03-19 DIAGNOSIS — Z87442 Personal history of urinary calculi: Secondary | ICD-10-CM | POA: Diagnosis not present

## 2021-03-19 DIAGNOSIS — Z981 Arthrodesis status: Secondary | ICD-10-CM | POA: Diagnosis not present

## 2021-03-19 DIAGNOSIS — M810 Age-related osteoporosis without current pathological fracture: Secondary | ICD-10-CM | POA: Diagnosis present

## 2021-03-19 DIAGNOSIS — Z7951 Long term (current) use of inhaled steroids: Secondary | ICD-10-CM | POA: Diagnosis not present

## 2021-03-19 DIAGNOSIS — E892 Postprocedural hypoparathyroidism: Secondary | ICD-10-CM | POA: Diagnosis not present

## 2021-03-19 DIAGNOSIS — M4802 Spinal stenosis, cervical region: Secondary | ICD-10-CM | POA: Diagnosis not present

## 2021-03-19 DIAGNOSIS — Z91040 Latex allergy status: Secondary | ICD-10-CM | POA: Diagnosis not present

## 2021-03-19 DIAGNOSIS — E785 Hyperlipidemia, unspecified: Secondary | ICD-10-CM | POA: Diagnosis not present

## 2021-03-19 DIAGNOSIS — M1990 Unspecified osteoarthritis, unspecified site: Secondary | ICD-10-CM | POA: Diagnosis present

## 2021-03-19 DIAGNOSIS — Z87891 Personal history of nicotine dependence: Secondary | ICD-10-CM | POA: Diagnosis not present

## 2021-03-19 DIAGNOSIS — R5383 Other fatigue: Secondary | ICD-10-CM | POA: Insufficient documentation

## 2021-03-19 DIAGNOSIS — M5011 Cervical disc disorder with radiculopathy,  high cervical region: Secondary | ICD-10-CM | POA: Diagnosis not present

## 2021-03-19 HISTORY — DX: Depression, unspecified: F32.A

## 2021-03-19 HISTORY — DX: Personal history of urinary calculi: Z87.442

## 2021-03-19 HISTORY — DX: Essential (primary) hypertension: I10

## 2021-03-19 HISTORY — DX: Cardiac murmur, unspecified: R01.1

## 2021-03-19 HISTORY — DX: Anemia, unspecified: D64.9

## 2021-03-19 LAB — CBC
HCT: 36.4 % (ref 36.0–46.0)
Hemoglobin: 11.9 g/dL — ABNORMAL LOW (ref 12.0–15.0)
MCH: 32.3 pg (ref 26.0–34.0)
MCHC: 32.7 g/dL (ref 30.0–36.0)
MCV: 98.9 fL (ref 80.0–100.0)
Platelets: 207 10*3/uL (ref 150–400)
RBC: 3.68 MIL/uL — ABNORMAL LOW (ref 3.87–5.11)
RDW: 15.3 % (ref 11.5–15.5)
WBC: 9.3 10*3/uL (ref 4.0–10.5)
nRBC: 0 % (ref 0.0–0.2)

## 2021-03-19 LAB — BASIC METABOLIC PANEL
Anion gap: 9 (ref 5–15)
BUN: 28 mg/dL — ABNORMAL HIGH (ref 8–23)
CO2: 24 mmol/L (ref 22–32)
Calcium: 9.1 mg/dL (ref 8.9–10.3)
Chloride: 108 mmol/L (ref 98–111)
Creatinine, Ser: 0.98 mg/dL (ref 0.44–1.00)
GFR, Estimated: >60 mL/min (ref >60)
Glucose, Bld: 94 mg/dL (ref 70–99)
Potassium: 4 mmol/L (ref 3.5–5.1)
Sodium: 141 mmol/L (ref 135–145)

## 2021-03-19 LAB — TYPE AND SCREEN
ABO/RH(D): O POS
Antibody Screen: NEGATIVE

## 2021-03-19 LAB — SURGICAL PCR SCREEN
MRSA, PCR: NEGATIVE
Staphylococcus aureus: NEGATIVE

## 2021-03-19 LAB — SARS CORONAVIRUS 2 (TAT 6-24 HRS): SARS Coronavirus 2: NEGATIVE

## 2021-03-19 NOTE — Progress Notes (Signed)
PCP - London Pepper, MD Cardiologist - Jenkins Rouge, MD  PPM/ICD - denies Device Orders - n/a Rep Notified - n/a  Chest x-ray - 05/13/2020 EKG - 01/16/2021 Stress Test - 08/2015 ECHO - 02/12/2021 Cardiac Cath - 10/18/2015  Sleep Study - n/a  Fasting Blood Sugar - n/a  Blood Thinner Instructions: n/a  Aspirin Instructions: Aspirin - last dose 03/08/2021 per patient  Patient was instructed: As of today, STOP taking any Aspirin (unless otherwise instructed by your surgeon) Aleve, Naproxen, Ibuprofen, Motrin, Advil, Goody's, BC's, all herbal medications, fish oil, and all vitamins.  ERAS Protcol - n/a  COVID TEST- done in PAT on 03/19/2021   Anesthesia review: yes, cardiac history  Patient denies shortness of breath, fever, cough and chest pain at PAT appointment   All instructions explained to the patient, with a verbal understanding of the material. Patient agrees to go over the instructions while at home for a better understanding. Patient also instructed to self quarantine after being tested for COVID-19. The opportunity to ask questions was provided.

## 2021-03-20 NOTE — Progress Notes (Signed)
Anesthesia Chart Review:  Follows with cardiology for history of minimal CAD by cath 2017. False positive myovue  with cath 10/2015 showing only 20% LAD otherwise normal. She also has prior history of dyspnea/fatigue with elevated BNP prompting Lasix in 0/2585 (? Diastolic CHF).  TTE done 02/12/21 EF 59% grade one diastolic no pulmonary HTN mild MR. She has benign pulmonary nodule stable by CT 05/2020.  Last seen by Dr. Johnsie Cancel on 02/19/2021 for preop evaluation.  Per note, "cleared to have anterior cervical fusion with Dr. Arnoldo Morale 03/21/2021."  Patient was seen by Dr. Fletcher Anon on 02/26/2021 for evaluation of left subclavian stenosis.  Per note, "Left subclavian artery stenosis: This does not seem to be associated with significant obstruction to flow given that there was only 10 mm systolic gradient between the right arm and the left arm.  In addition, her left arm pulses are within the normal range.  She does have bilateral arm discomfort that seems to be arthritic and/or neuropathic.  No convincing evidence of claudication.  Thus, no indication for revascularization.  Continue treatment of risk factors. Her blood pressure should be always checked in the right arm which is more reflective of central aortic pressure."  She was advised to follow-up on an as-needed basis.  Hx of COPD, maintained on symbicort.   Hx of renal insufficiency, creatinine WNL on prerp labs.  Preop labs reviewed, unremarkable.   EKG 01/16/21: NSR. Rate 60.  TTE 02/12/21:  1. Left ventricular ejection fraction by 3D volume is 59 %. The left  ventricle has normal function. The left ventricle has no regional wall  motion abnormalities. Left ventricular diastolic parameters are consistent  with Grade I diastolic dysfunction  (impaired relaxation). The average left ventricular global longitudinal  strain is -20.4 %. The global longitudinal strain is normal.   2. Right ventricular systolic function is normal. The right ventricular   size is normal.   3. The mitral valve is normal in structure. Mild mitral valve  regurgitation. No evidence of mitral stenosis.   4. The aortic valve is normal in structure. Aortic valve regurgitation is  not visualized. No aortic stenosis is present.   5. The inferior vena cava is normal in size with greater than 50%  respiratory variability, suggesting right atrial pressure of 3 mmHg.   Comparison(s): No significant change from prior study. Prior images  reviewed side by side.   Carotid ultrasound 01/29/21: Summary:  Right Carotid: There is no evidence of stenosis in the right ICA. Non-hemodynamically significant plaque <50% noted in the CCA.  Left Carotid: Velocities in the left ICA are consistent with a 1-39% stenosis. Non-hemodynamically significant plaque <50% noted in the CCA.  Vertebrals:  Right vertebral artery demonstrates antegrade flow. Atypical antegrade flow in the left vertebral artery.  Subclavians: Left subclavian artery was stenotic. Left subclavian artery flow was disturbed. Normal flow hemodynamics were seen in the right subclavian artery.   Cath 10/18/2015: Prox LAD to Mid LAD lesion, 20 %stenosed.   No significant CAD Mild calcification of proximal LAD Normal EF 65%    Maureen Chung [facility]/Anesthesiology Phone [phone] 03/20/2021 10:13 AM

## 2021-03-20 NOTE — Anesthesia Preprocedure Evaluation (Addendum)
Anesthesia Evaluation  Patient identified by MRN, date of birth, ID band Patient awake    Reviewed: Allergy & Precautions, NPO status , Patient's Chart, lab work & pertinent test results  Airway Mallampati: II  TM Distance: >3 FB Neck ROM: Full    Dental  (+) Dental Advisory Given   Pulmonary COPD,  COPD inhaler, former smoker,    breath sounds clear to auscultation       Cardiovascular hypertension, Pt. on medications and Pt. on home beta blockers + CAD   Rhythm:Regular Rate:Normal     Neuro/Psych negative neurological ROS     GI/Hepatic negative GI ROS, Neg liver ROS,   Endo/Other  negative endocrine ROS  Renal/GU Renal disease     Musculoskeletal  (+) Arthritis ,   Abdominal   Peds  Hematology  (+) anemia ,   Anesthesia Other Findings   Reproductive/Obstetrics                            Lab Results  Component Value Date   WBC 9.3 03/19/2021   HGB 11.9 (L) 03/19/2021   HCT 36.4 03/19/2021   MCV 98.9 03/19/2021   PLT 207 03/19/2021   Lab Results  Component Value Date   CREATININE 0.98 03/19/2021   BUN 28 (H) 03/19/2021   NA 141 03/19/2021   K 4.0 03/19/2021   CL 108 03/19/2021   CO2 24 03/19/2021    Anesthesia Physical Anesthesia Plan  ASA: 3  Anesthesia Plan: General   Post-op Pain Management: Tylenol PO (pre-op)   Induction:   PONV Risk Score and Plan: 3 and Dexamethasone, Ondansetron and Treatment may vary due to age or medical condition  Airway Management Planned: Oral ETT  Additional Equipment:   Intra-op Plan:   Post-operative Plan: Extubation in OR  Informed Consent: I have reviewed the patients History and Physical, chart, labs and discussed the procedure including the risks, benefits and alternatives for the proposed anesthesia with the patient or authorized representative who has indicated his/her understanding and acceptance.     Dental advisory  given  Plan Discussed with:   Anesthesia Plan Comments: (PAT note by Karoline Caldwell, PA-C: Follows with cardiology for history of minimal CAD by cath 2017. False positive myovue with cath 10/2015 showing only 20% LAD otherwise normal. She also has prior history of dyspnea/fatigue with elevated BNP prompting Lasix in 03/8297 (? Diastolic CHF). TTE done 02/12/21 EF 59% grade one diastolic no pulmonary HTN mild MR. She has benign pulmonary nodule stable by CT 05/2020.  Last seen by Dr. Johnsie Cancel on 02/19/2021 for preop evaluation.  Per note, "cleared to have anterior cervical fusion with Dr. Arnoldo Morale 03/21/2021."  Patient was seen by Dr. Fletcher Anon on 02/26/2021 for evaluation of left subclavian stenosis.  Per note, "Left subclavian artery stenosis:This does not seem to be associated with significant obstruction to flow given that there was only 10 mm systolic gradient between the right arm and the left arm. In addition, her left arm pulses are within the normal range. She does have bilateral arm discomfort that seems to be arthritic and/or neuropathic. No convincing evidence of claudication. Thus, no indication for revascularization. Continue treatment of risk factors. Her blood pressure should be always checked in the right arm which is more reflective of central aortic pressure."  She was advised to follow-up on an as-needed basis.  Hx of COPD, maintained on symbicort.   Hx of renal insufficiency, creatinine WNL on prerp  labs.  Preop labs reviewed, unremarkable.   EKG 01/16/21: NSR. Rate 60.  TTE 02/12/21: 1. Left ventricular ejection fraction by 3D volume is 59 %. The left  ventricle has normal function. The left ventricle has no regional wall  motion abnormalities. Left ventricular diastolic parameters are consistent  with Grade I diastolic dysfunction  (impaired relaxation). The average left ventricular global longitudinal  strain is -20.4 %. The global longitudinal strain is normal.  2. Right  ventricular systolic function is normal. The right ventricular  size is normal.  3. The mitral valve is normal in structure. Mild mitral valve  regurgitation. No evidence of mitral stenosis.  4. The aortic valve is normal in structure. Aortic valve regurgitation is  not visualized. No aortic stenosis is present.  5. The inferior vena cava is normal in size with greater than 50%  respiratory variability, suggesting right atrial pressure of 3 mmHg.   Comparison(s): No significant change from prior study. Prior images  reviewed side by side.   Carotid ultrasound 01/29/21: Summary:  Right Carotid: There is no evidence of stenosis in the right ICA. Non-hemodynamically significant plaque <50% noted in the CCA.  Left Carotid: Velocities in the left ICA are consistent with a 1-39% stenosis. Non-hemodynamically significant plaque <50% noted in the CCA.  Vertebrals: Right vertebral artery demonstrates antegrade flow. Atypical antegrade flow in the left vertebral artery.  Subclavians: Left subclavian artery was stenotic. Left subclavian artery flow was disturbed. Normal flow hemodynamics were seen in the right subclavian artery.   Cath 10/18/2015:  Prox LAD to Mid LAD lesion, 20 %stenosed.  No significant CAD Mild calcification of proximal LAD Normal EF 65%   )       Anesthesia Quick Evaluation

## 2021-03-21 ENCOUNTER — Inpatient Hospital Stay (HOSPITAL_COMMUNITY): Payer: Medicare Other | Admitting: Certified Registered Nurse Anesthetist

## 2021-03-21 ENCOUNTER — Inpatient Hospital Stay (HOSPITAL_COMMUNITY)
Admission: RE | Admit: 2021-03-21 | Discharge: 2021-03-22 | DRG: 472 | Disposition: A | Discharge status: Home / Self Care | Payer: Medicare Other | Attending: Neurosurgery | Admitting: Neurosurgery

## 2021-03-21 ENCOUNTER — Encounter (HOSPITAL_COMMUNITY): Payer: Self-pay | Admitting: Neurosurgery

## 2021-03-21 ENCOUNTER — Encounter (HOSPITAL_COMMUNITY)
Admission: RE | Disposition: A | Discharge status: Home / Self Care | Payer: Self-pay | Source: Home / Self Care | Attending: Neurosurgery

## 2021-03-21 ENCOUNTER — Other Ambulatory Visit: Payer: Self-pay

## 2021-03-21 ENCOUNTER — Inpatient Hospital Stay (HOSPITAL_COMMUNITY): Payer: Medicare Other

## 2021-03-21 ENCOUNTER — Inpatient Hospital Stay (HOSPITAL_COMMUNITY): Payer: Medicare Other | Admitting: Physician Assistant

## 2021-03-21 DIAGNOSIS — N183 Chronic kidney disease, stage 3 unspecified: Secondary | ICD-10-CM | POA: Diagnosis present

## 2021-03-21 DIAGNOSIS — I2510 Atherosclerotic heart disease of native coronary artery without angina pectoris: Secondary | ICD-10-CM | POA: Diagnosis present

## 2021-03-21 DIAGNOSIS — M1990 Unspecified osteoarthritis, unspecified site: Secondary | ICD-10-CM | POA: Diagnosis present

## 2021-03-21 DIAGNOSIS — E213 Hyperparathyroidism, unspecified: Secondary | ICD-10-CM | POA: Diagnosis present

## 2021-03-21 DIAGNOSIS — M4322 Fusion of spine, cervical region: Secondary | ICD-10-CM | POA: Diagnosis not present

## 2021-03-21 DIAGNOSIS — Z8249 Family history of ischemic heart disease and other diseases of the circulatory system: Secondary | ICD-10-CM | POA: Diagnosis not present

## 2021-03-21 DIAGNOSIS — I129 Hypertensive chronic kidney disease with stage 1 through stage 4 chronic kidney disease, or unspecified chronic kidney disease: Secondary | ICD-10-CM | POA: Diagnosis present

## 2021-03-21 DIAGNOSIS — M5011 Cervical disc disorder with radiculopathy,  high cervical region: Secondary | ICD-10-CM | POA: Diagnosis present

## 2021-03-21 DIAGNOSIS — M4722 Other spondylosis with radiculopathy, cervical region: Secondary | ICD-10-CM | POA: Diagnosis present

## 2021-03-21 DIAGNOSIS — Z91040 Latex allergy status: Secondary | ICD-10-CM | POA: Diagnosis not present

## 2021-03-21 DIAGNOSIS — E785 Hyperlipidemia, unspecified: Secondary | ICD-10-CM | POA: Diagnosis present

## 2021-03-21 DIAGNOSIS — M4802 Spinal stenosis, cervical region: Secondary | ICD-10-CM | POA: Diagnosis present

## 2021-03-21 DIAGNOSIS — E892 Postprocedural hypoparathyroidism: Secondary | ICD-10-CM | POA: Diagnosis present

## 2021-03-21 DIAGNOSIS — M4712 Other spondylosis with myelopathy, cervical region: Secondary | ICD-10-CM | POA: Diagnosis present

## 2021-03-21 DIAGNOSIS — Z87442 Personal history of urinary calculi: Secondary | ICD-10-CM | POA: Diagnosis not present

## 2021-03-21 DIAGNOSIS — M810 Age-related osteoporosis without current pathological fracture: Secondary | ICD-10-CM | POA: Diagnosis present

## 2021-03-21 DIAGNOSIS — Z87891 Personal history of nicotine dependence: Secondary | ICD-10-CM

## 2021-03-21 DIAGNOSIS — Z7951 Long term (current) use of inhaled steroids: Secondary | ICD-10-CM | POA: Diagnosis not present

## 2021-03-21 DIAGNOSIS — Z20822 Contact with and (suspected) exposure to covid-19: Secondary | ICD-10-CM | POA: Diagnosis present

## 2021-03-21 DIAGNOSIS — Z7982 Long term (current) use of aspirin: Secondary | ICD-10-CM | POA: Diagnosis not present

## 2021-03-21 DIAGNOSIS — Z79899 Other long term (current) drug therapy: Secondary | ICD-10-CM

## 2021-03-21 DIAGNOSIS — I10 Essential (primary) hypertension: Secondary | ICD-10-CM | POA: Diagnosis not present

## 2021-03-21 DIAGNOSIS — Z419 Encounter for procedure for purposes other than remedying health state, unspecified: Secondary | ICD-10-CM

## 2021-03-21 DIAGNOSIS — Z90710 Acquired absence of both cervix and uterus: Secondary | ICD-10-CM | POA: Diagnosis not present

## 2021-03-21 DIAGNOSIS — M5001 Cervical disc disorder with myelopathy,  high cervical region: Secondary | ICD-10-CM | POA: Diagnosis present

## 2021-03-21 DIAGNOSIS — Z981 Arthrodesis status: Secondary | ICD-10-CM | POA: Diagnosis not present

## 2021-03-21 HISTORY — PX: ANTERIOR CERVICAL DECOMP/DISCECTOMY FUSION: SHX1161

## 2021-03-21 LAB — ABO/RH: ABO/RH(D): O POS

## 2021-03-21 SURGERY — ANTERIOR CERVICAL DECOMPRESSION/DISCECTOMY FUSION 3 LEVELS
Anesthesia: General | Site: Neck

## 2021-03-21 MED ORDER — DEXAMETHASONE SODIUM PHOSPHATE 10 MG/ML IJ SOLN
INTRAMUSCULAR | Status: AC
  Filled 2021-03-21: qty 1

## 2021-03-21 MED ORDER — CHLORHEXIDINE GLUCONATE 4 % EX LIQD: 1.0000 "application " | Freq: Once | CUTANEOUS | Status: DC

## 2021-03-21 MED ORDER — MIDAZOLAM HCL 5 MG/5ML IJ SOLN
INTRAMUSCULAR | Status: DC | PRN
Start: 2021-03-21 — End: 2021-03-21
  Administered 2021-03-21: 2 mg via INTRAVENOUS

## 2021-03-21 MED ORDER — FUROSEMIDE 20 MG PO TABS: 20.0000 mg | ORAL_TABLET | Freq: Every day | ORAL | Status: DC | PRN

## 2021-03-21 MED ORDER — THROMBIN 5000 UNITS EX SOLR: OROMUCOSAL | Status: DC | PRN

## 2021-03-21 MED ORDER — ORAL CARE MOUTH RINSE: 15.0000 mL | Freq: Once | OROMUCOSAL | Status: AC

## 2021-03-21 MED ORDER — BACITRACIN ZINC 500 UNIT/GM EX OINT
TOPICAL_OINTMENT | CUTANEOUS | Status: AC
  Filled 2021-03-21: qty 28.35

## 2021-03-21 MED ORDER — BUPIVACAINE-EPINEPHRINE 0.5% -1:200000 IJ SOLN
INTRAMUSCULAR | Status: AC
  Filled 2021-03-21: qty 1

## 2021-03-21 MED ORDER — HYDROMORPHONE HCL 1 MG/ML IJ SOLN
INTRAMUSCULAR | Status: AC
  Filled 2021-03-21: qty 0.5

## 2021-03-21 MED ORDER — ZOLPIDEM TARTRATE 5 MG PO TABS: 5.0000 mg | ORAL_TABLET | Freq: Every evening | ORAL | Status: DC | PRN

## 2021-03-21 MED ORDER — ALBUTEROL SULFATE (2.5 MG/3ML) 0.083% IN NEBU: 2.5000 mg | INHALATION_SOLUTION | RESPIRATORY_TRACT | Status: DC | PRN

## 2021-03-21 MED ORDER — LIDOCAINE 2% (20 MG/ML) 5 ML SYRINGE
INTRAMUSCULAR | Status: DC | PRN
  Administered 2021-03-21: 60 mg via INTRAVENOUS

## 2021-03-21 MED ORDER — PHENYLEPHRINE 40 MCG/ML (10ML) SYRINGE FOR IV PUSH (FOR BLOOD PRESSURE SUPPORT)
PREFILLED_SYRINGE | INTRAVENOUS | Status: DC | PRN
  Administered 2021-03-21: 80 ug via INTRAVENOUS

## 2021-03-21 MED ORDER — LACTATED RINGERS IV SOLN: INTRAVENOUS | Status: DC

## 2021-03-21 MED ORDER — ACETAMINOPHEN 500 MG PO TABS
1000.0000 mg | ORAL_TABLET | Freq: Once | ORAL | Status: AC
  Administered 2021-03-21: 1000 mg via ORAL
  Filled 2021-03-21: qty 2

## 2021-03-21 MED ORDER — DEXAMETHASONE SODIUM PHOSPHATE 4 MG/ML IJ SOLN: 4.0000 mg | Freq: Four times a day (QID) | INTRAMUSCULAR | Status: AC

## 2021-03-21 MED ORDER — ONDANSETRON HCL 4 MG/2ML IJ SOLN
INTRAMUSCULAR | Status: AC
  Filled 2021-03-21: qty 2

## 2021-03-21 MED ORDER — THROMBIN 5000 UNITS EX SOLR
CUTANEOUS | Status: AC
  Filled 2021-03-21: qty 5000

## 2021-03-21 MED ORDER — 0.9 % SODIUM CHLORIDE (POUR BTL) OPTIME
TOPICAL | Status: DC | PRN
  Administered 2021-03-21: 1000 mL

## 2021-03-21 MED ORDER — DEXAMETHASONE 4 MG PO TABS
4.0000 mg | ORAL_TABLET | Freq: Four times a day (QID) | ORAL | Status: AC
  Administered 2021-03-21 (×2): 4 mg via ORAL
  Filled 2021-03-21 (×2): qty 1

## 2021-03-21 MED ORDER — BACLOFEN 10 MG PO TABS: 5.0000 mg | ORAL_TABLET | Freq: Three times a day (TID) | ORAL | Status: DC | PRN

## 2021-03-21 MED ORDER — PHENYLEPHRINE HCL-NACL 20-0.9 MG/250ML-% IV SOLN
INTRAVENOUS | Status: DC | PRN
  Administered 2021-03-21: 40 ug/min via INTRAVENOUS

## 2021-03-21 MED ORDER — CEFAZOLIN SODIUM-DEXTROSE 2-4 GM/100ML-% IV SOLN
2.0000 g | Freq: Three times a day (TID) | INTRAVENOUS | Status: AC
  Administered 2021-03-21 (×2): 2 g via INTRAVENOUS
  Filled 2021-03-21 (×2): qty 100

## 2021-03-21 MED ORDER — ALBUTEROL SULFATE HFA 108 (90 BASE) MCG/ACT IN AERS: 2.0000 | INHALATION_SPRAY | Freq: Four times a day (QID) | RESPIRATORY_TRACT | Status: DC | PRN

## 2021-03-21 MED ORDER — MENTHOL 3 MG MT LOZG
1.0000 | LOZENGE | OROMUCOSAL | Status: DC | PRN
  Filled 2021-03-21: qty 9

## 2021-03-21 MED ORDER — MORPHINE SULFATE (PF) 4 MG/ML IV SOLN: 4.0000 mg | INTRAVENOUS | Status: DC | PRN

## 2021-03-21 MED ORDER — CHLORHEXIDINE GLUCONATE 0.12 % MT SOLN
15.0000 mL | Freq: Once | OROMUCOSAL | Status: AC
  Administered 2021-03-21: 15 mL via OROMUCOSAL
  Filled 2021-03-21: qty 15

## 2021-03-21 MED ORDER — BUPIVACAINE-EPINEPHRINE (PF) 0.5% -1:200000 IJ SOLN
INTRAMUSCULAR | Status: DC | PRN
  Administered 2021-03-21: 10 mL via PERINEURAL

## 2021-03-21 MED ORDER — OXYCODONE HCL 5 MG PO TABS
10.0000 mg | ORAL_TABLET | ORAL | Status: DC | PRN
  Administered 2021-03-21 – 2021-03-22 (×3): 10 mg via ORAL
  Filled 2021-03-21 (×3): qty 2

## 2021-03-21 MED ORDER — MIDAZOLAM HCL 2 MG/2ML IJ SOLN
INTRAMUSCULAR | Status: AC
  Filled 2021-03-21: qty 2

## 2021-03-21 MED ORDER — MOMETASONE FURO-FORMOTEROL FUM 200-5 MCG/ACT IN AERO
2.0000 | INHALATION_SPRAY | Freq: Two times a day (BID) | RESPIRATORY_TRACT | Status: DC
  Administered 2021-03-21 – 2021-03-22 (×2): 2 via RESPIRATORY_TRACT
  Filled 2021-03-21: qty 8.8

## 2021-03-21 MED ORDER — BISACODYL 10 MG RE SUPP: 10.0000 mg | Freq: Every day | RECTAL | Status: DC | PRN

## 2021-03-21 MED ORDER — PROPOFOL 10 MG/ML IV BOLUS
INTRAVENOUS | Status: AC
  Filled 2021-03-21: qty 20

## 2021-03-21 MED ORDER — NAPHAZOLINE-GLYCERIN 0.012-0.25 % OP SOLN: 1.0000 [drp] | Freq: Four times a day (QID) | OPHTHALMIC | Status: DC | PRN

## 2021-03-21 MED ORDER — DEXAMETHASONE SODIUM PHOSPHATE 10 MG/ML IJ SOLN
INTRAMUSCULAR | Status: DC | PRN
Start: 2021-03-21 — End: 2021-03-21
  Administered 2021-03-21: 10 mg via INTRAVENOUS

## 2021-03-21 MED ORDER — SUGAMMADEX SODIUM 200 MG/2ML IV SOLN
INTRAVENOUS | Status: DC | PRN
Start: 2021-03-21 — End: 2021-03-21
  Administered 2021-03-21: 400 mg via INTRAVENOUS

## 2021-03-21 MED ORDER — SERTRALINE HCL 50 MG PO TABS
200.0000 mg | ORAL_TABLET | Freq: Every day | ORAL | Status: DC
  Administered 2021-03-22: 200 mg via ORAL
  Filled 2021-03-21: qty 4

## 2021-03-21 MED ORDER — METOPROLOL SUCCINATE ER 25 MG PO TB24
25.0000 mg | ORAL_TABLET | Freq: Every day | ORAL | Status: DC
Start: 2021-03-22 — End: 2021-03-22
  Administered 2021-03-22: 25 mg via ORAL
  Filled 2021-03-21: qty 1

## 2021-03-21 MED ORDER — FENTANYL CITRATE (PF) 250 MCG/5ML IJ SOLN
INTRAMUSCULAR | Status: AC
  Filled 2021-03-21: qty 5

## 2021-03-21 MED ORDER — HYDROMORPHONE HCL 1 MG/ML IJ SOLN
INTRAMUSCULAR | Status: DC | PRN
  Administered 2021-03-21 (×2): .5 mg via INTRAVENOUS

## 2021-03-21 MED ORDER — PANTOPRAZOLE SODIUM 40 MG IV SOLR: 40.0000 mg | Freq: Every day | INTRAVENOUS | Status: DC

## 2021-03-21 MED ORDER — PHENOL 1.4 % MT LIQD: 1.0000 | OROMUCOSAL | Status: DC | PRN

## 2021-03-21 MED ORDER — ACETAMINOPHEN 325 MG PO TABS: 650.0000 mg | ORAL_TABLET | ORAL | Status: DC | PRN

## 2021-03-21 MED ORDER — PROPOFOL 10 MG/ML IV BOLUS
INTRAVENOUS | Status: DC | PRN
Start: 2021-03-21 — End: 2021-03-21
  Administered 2021-03-21: 120 mg via INTRAVENOUS

## 2021-03-21 MED ORDER — LACTATED RINGERS IV SOLN: INTRAVENOUS | Status: DC | PRN

## 2021-03-21 MED ORDER — PANTOPRAZOLE SODIUM 40 MG PO TBEC
40.0000 mg | DELAYED_RELEASE_TABLET | Freq: Every day | ORAL | Status: DC
  Administered 2021-03-22: 40 mg via ORAL
  Filled 2021-03-21: qty 1

## 2021-03-21 MED ORDER — DOXEPIN HCL 10 MG PO CAPS
10.0000 mg | ORAL_CAPSULE | Freq: Every evening | ORAL | Status: DC | PRN
  Filled 2021-03-21: qty 1

## 2021-03-21 MED ORDER — CYCLOBENZAPRINE HCL 10 MG PO TABS
10.0000 mg | ORAL_TABLET | Freq: Three times a day (TID) | ORAL | Status: DC | PRN
  Administered 2021-03-21 – 2021-03-22 (×2): 10 mg via ORAL
  Filled 2021-03-21 (×2): qty 1

## 2021-03-21 MED ORDER — DOCUSATE SODIUM 100 MG PO CAPS
100.0000 mg | ORAL_CAPSULE | Freq: Two times a day (BID) | ORAL | Status: DC
  Administered 2021-03-21 – 2021-03-22 (×3): 100 mg via ORAL
  Filled 2021-03-21 (×3): qty 1

## 2021-03-21 MED ORDER — LIDOCAINE 2% (20 MG/ML) 5 ML SYRINGE
INTRAMUSCULAR | Status: AC
  Filled 2021-03-21: qty 5

## 2021-03-21 MED ORDER — ONDANSETRON HCL 4 MG PO TABS: 4.0000 mg | ORAL_TABLET | Freq: Four times a day (QID) | ORAL | Status: DC | PRN

## 2021-03-21 MED ORDER — ROCURONIUM BROMIDE 10 MG/ML (PF) SYRINGE
PREFILLED_SYRINGE | INTRAVENOUS | Status: AC
  Filled 2021-03-21: qty 10

## 2021-03-21 MED ORDER — FENTANYL CITRATE (PF) 250 MCG/5ML IJ SOLN
INTRAMUSCULAR | Status: DC | PRN
  Administered 2021-03-21: 50 ug via INTRAVENOUS
  Administered 2021-03-21 (×2): 100 ug via INTRAVENOUS

## 2021-03-21 MED ORDER — ONDANSETRON HCL 4 MG/2ML IJ SOLN: 4.0000 mg | Freq: Four times a day (QID) | INTRAMUSCULAR | Status: DC | PRN

## 2021-03-21 MED ORDER — NITROGLYCERIN 0.4 MG SL SUBL: 0.4000 mg | SUBLINGUAL_TABLET | SUBLINGUAL | Status: DC | PRN

## 2021-03-21 MED ORDER — CYCLOBENZAPRINE HCL 10 MG PO TABS: 10.0000 mg | ORAL_TABLET | Freq: Three times a day (TID) | ORAL | Status: DC | PRN

## 2021-03-21 MED ORDER — ROCURONIUM BROMIDE 100 MG/10ML IV SOLN
INTRAVENOUS | Status: DC | PRN
Start: 2021-03-21 — End: 2021-03-21
  Administered 2021-03-21: 20 mg via INTRAVENOUS
  Administered 2021-03-21: 50 mg via INTRAVENOUS
  Administered 2021-03-21: 20 mg via INTRAVENOUS
  Administered 2021-03-21: 10 mg via INTRAVENOUS

## 2021-03-21 MED ORDER — ACETAMINOPHEN 650 MG RE SUPP: 650.0000 mg | RECTAL | Status: DC | PRN

## 2021-03-21 MED ORDER — ATORVASTATIN CALCIUM 40 MG PO TABS
40.0000 mg | ORAL_TABLET | Freq: Every day | ORAL | Status: DC
  Administered 2021-03-22: 40 mg via ORAL
  Filled 2021-03-21: qty 1

## 2021-03-21 MED ORDER — MAGNESIUM OXIDE -MG SUPPLEMENT 200 MG PO TABS: 500.0000 mg | ORAL_TABLET | Freq: Every day | ORAL | Status: DC

## 2021-03-21 MED ORDER — CEFAZOLIN SODIUM-DEXTROSE 2-4 GM/100ML-% IV SOLN
2.0000 g | INTRAVENOUS | Status: AC
  Administered 2021-03-21: 2 g via INTRAVENOUS
  Filled 2021-03-21: qty 100

## 2021-03-21 MED ORDER — BACITRACIN ZINC 500 UNIT/GM EX OINT
TOPICAL_OINTMENT | CUTANEOUS | Status: DC | PRN
  Administered 2021-03-21: 1 via TOPICAL

## 2021-03-21 MED ORDER — TACROLIMUS 0.1 % EX OINT: 1.0000 "application " | TOPICAL_OINTMENT | Freq: Every day | CUTANEOUS | Status: DC

## 2021-03-21 MED ORDER — EPHEDRINE SULFATE-NACL 50-0.9 MG/10ML-% IV SOSY
PREFILLED_SYRINGE | INTRAVENOUS | Status: DC | PRN
  Administered 2021-03-21 (×2): 10 mg via INTRAVENOUS
  Administered 2021-03-21: 5 mg via INTRAVENOUS

## 2021-03-21 MED ORDER — OXYCODONE HCL 5 MG PO TABS
5.0000 mg | ORAL_TABLET | ORAL | Status: DC | PRN
  Administered 2021-03-21: 5 mg via ORAL
  Filled 2021-03-21: qty 1

## 2021-03-21 MED ORDER — MAGNESIUM OXIDE -MG SUPPLEMENT 400 (240 MG) MG PO TABS
400.0000 mg | ORAL_TABLET | Freq: Every day | ORAL | Status: DC
  Administered 2021-03-21 – 2021-03-22 (×2): 400 mg via ORAL
  Filled 2021-03-21 (×2): qty 1

## 2021-03-21 MED ORDER — ACETAMINOPHEN 500 MG PO TABS
1000.0000 mg | ORAL_TABLET | Freq: Four times a day (QID) | ORAL | Status: AC
  Administered 2021-03-21 – 2021-03-22 (×4): 1000 mg via ORAL
  Filled 2021-03-21 (×4): qty 2

## 2021-03-21 MED ORDER — ALUM & MAG HYDROXIDE-SIMETH 200-200-20 MG/5ML PO SUSP: 30.0000 mL | Freq: Four times a day (QID) | ORAL | Status: DC | PRN

## 2021-03-21 MED ORDER — LATANOPROST 0.005 % OP SOLN
1.0000 [drp] | Freq: Every day | OPHTHALMIC | Status: DC
  Filled 2021-03-21: qty 2.5

## 2021-03-21 SURGICAL SUPPLY — 58 items
APL SKNCLS STERI-STRIP NONHPOA (GAUZE/BANDAGES/DRESSINGS) ×2
BAG COUNTER SPONGE SURGICOUNT (BAG) ×3 IMPLANT
BAG SPNG CNTER NS LX DISP (BAG) ×1
BAND INSRT 18 STRL LF DISP RB (MISCELLANEOUS)
BAND RUBBER #18 3X1/16 STRL (MISCELLANEOUS) IMPLANT
BENZOIN TINCTURE PRP APPL 2/3 (GAUZE/BANDAGES/DRESSINGS) ×6 IMPLANT
BIT DRILL NEURO 2X3.1 SFT TUCH (MISCELLANEOUS) ×2 IMPLANT
BIT DRILL SPINE QC 14 (BIT) ×1 IMPLANT
BLADE SURG 15 STRL LF DISP TIS (BLADE) ×2 IMPLANT
BLADE SURG 15 STRL SS (BLADE) ×2
BLADE ULTRA TIP 2M (BLADE) ×3 IMPLANT
BUR BARREL STRAIGHT FLUTE 4.0 (BURR) ×3 IMPLANT
BUR MATCHSTICK NEURO 3.0 LAGG (BURR) ×3 IMPLANT
CAGE PEEK VISTAS 11X14X6 (Cage) ×2 IMPLANT
CANISTER SUCT 3000ML PPV (MISCELLANEOUS) ×3 IMPLANT
CARTRIDGE OIL MAESTRO DRILL (MISCELLANEOUS) ×2 IMPLANT
COVER MAYO STAND STRL (DRAPES) ×3 IMPLANT
DIFFUSER DRILL AIR PNEUMATIC (MISCELLANEOUS) ×3 IMPLANT
DRAIN JACKSON PRATT 10MM FLAT (MISCELLANEOUS) ×1 IMPLANT
DRAPE LAPAROTOMY 100X72 PEDS (DRAPES) ×3 IMPLANT
DRAPE MICROSCOPE LEICA (MISCELLANEOUS) IMPLANT
DRAPE SURG 17X23 STRL (DRAPES) ×6 IMPLANT
DRILL NEURO 2X3.1 SOFT TOUCH (MISCELLANEOUS) ×4
DRSG OPSITE POSTOP 3X4 (GAUZE/BANDAGES/DRESSINGS) ×3 IMPLANT
ELECT REM PT RETURN 9FT ADLT (ELECTROSURGICAL) ×2
ELECTRODE REM PT RTRN 9FT ADLT (ELECTROSURGICAL) ×2 IMPLANT
GAUZE 4X4 16PLY ~~LOC~~+RFID DBL (SPONGE) IMPLANT
GLOVE EXAM NITRILE XL STR (GLOVE) IMPLANT
GLOVE SURG ENC MOIS LTX SZ8 (GLOVE) ×3 IMPLANT
GLOVE SURG ENC MOIS LTX SZ8.5 (GLOVE) ×3 IMPLANT
GOWN STRL REUS W/ TWL LRG LVL3 (GOWN DISPOSABLE) IMPLANT
GOWN STRL REUS W/ TWL XL LVL3 (GOWN DISPOSABLE) ×2 IMPLANT
GOWN STRL REUS W/TWL LRG LVL3 (GOWN DISPOSABLE)
GOWN STRL REUS W/TWL XL LVL3 (GOWN DISPOSABLE) ×2
HEMOSTAT POWDER KIT SURGIFOAM (HEMOSTASIS) ×3 IMPLANT
KIT BASIN OR (CUSTOM PROCEDURE TRAY) ×3 IMPLANT
KIT TURNOVER KIT B (KITS) ×3 IMPLANT
MARKER SKIN DUAL TIP RULER LAB (MISCELLANEOUS) ×3 IMPLANT
NDL SPNL 18GX3.5 QUINCKE PK (NEEDLE) ×2 IMPLANT
NEEDLE HYPO 22GX1.5 SAFETY (NEEDLE) ×3 IMPLANT
NEEDLE SPNL 18GX3.5 QUINCKE PK (NEEDLE) ×2 IMPLANT
NS IRRIG 1000ML POUR BTL (IV SOLUTION) ×3 IMPLANT
OIL CARTRIDGE MAESTRO DRILL (MISCELLANEOUS) ×2
PACK LAMINECTOMY NEURO (CUSTOM PROCEDURE TRAY) ×3 IMPLANT
PEEK S VISTA 7X11X14 (Peek) ×1 IMPLANT
PIN DISTRACTION 14MM (PIN) ×6 IMPLANT
PLATE ANT CERV XTEND 3 LV 42 (Plate) ×1 IMPLANT
PUTTY DBM 5CC CALC GRAN (Putty) ×1 IMPLANT
SCREW XTD VAR 4.2 SELF TAP 12 (Screw) ×8 IMPLANT
SPONGE INTESTINAL PEANUT (DISPOSABLE) ×6 IMPLANT
SPONGE SURGIFOAM ABS GEL 100 (HEMOSTASIS) IMPLANT
STRIP CLOSURE SKIN 1/2X4 (GAUZE/BANDAGES/DRESSINGS) ×3 IMPLANT
SUT VIC AB 0 CT1 27 (SUTURE) ×2
SUT VIC AB 0 CT1 27XBRD ANTBC (SUTURE) ×2 IMPLANT
SUT VIC AB 3-0 SH 8-18 (SUTURE) ×3 IMPLANT
TOWEL GREEN STERILE (TOWEL DISPOSABLE) ×3 IMPLANT
TOWEL GREEN STERILE FF (TOWEL DISPOSABLE) ×3 IMPLANT
WATER STERILE IRR 1000ML POUR (IV SOLUTION) ×3 IMPLANT

## 2021-03-21 NOTE — Anesthesia Postprocedure Evaluation (Addendum)
Anesthesia Post Note  Patient: Stephens Shire  Procedure(s) Performed: ACDF, IP, PLATE/SCREWS C45, C56, C67 (Neck)     Patient location during evaluation: PACU Anesthesia Type: General Level of consciousness: awake and alert Pain management: pain level controlled Vital Signs Assessment: post-procedure vital signs reviewed and stable Respiratory status: spontaneous breathing, nonlabored ventilation, respiratory function stable and patient connected to nasal cannula oxygen Cardiovascular status: blood pressure returned to baseline and stable Postop Assessment: no apparent nausea or vomiting Anesthetic complications: no   No notable events documented.  Last Vitals:  Vitals:   03/21/21 1221 03/21/21 1322  BP: (!) 119/55 (!) 167/47  Pulse: 61 73  Resp: 11 18  Temp: 36.6 C 36.7 C  SpO2: 94% 96%    Last Pain:  Vitals:   03/21/21 1322  TempSrc: Oral  PainSc: 0-No pain                 Tiajuana Amass

## 2021-03-21 NOTE — Anesthesia Procedure Notes (Signed)
Procedure Name: Intubation Date/Time: 03/21/2021 7:51 AM Performed by: Jonna Munro, CRNA Pre-anesthesia Checklist: Patient identified, Emergency Drugs available, Suction available, Timeout performed and Patient being monitored Patient Re-evaluated:Patient Re-evaluated prior to induction Oxygen Delivery Method: Circle system utilized Preoxygenation: Pre-oxygenation with 100% oxygen Induction Type: IV induction Ventilation: Mask ventilation without difficulty Laryngoscope Size: Glidescope and 3 Grade View: Grade I Tube type: Oral Tube size: 7.0 mm Number of attempts: 1 Airway Equipment and Method: Stylet Placement Confirmation: ETT inserted through vocal cords under direct vision, positive ETCO2 and breath sounds checked- equal and bilateral Secured at: 22 cm Tube secured with: Tape Dental Injury: Teeth and Oropharynx as per pre-operative assessment

## 2021-03-21 NOTE — Transfer of Care (Signed)
Immediate Anesthesia Transfer of Care Note  Patient: Stephens Shire  Procedure(s) Performed: ACDF, IP, PLATE/SCREWS C45, C56, C67 (Neck)  Patient Location: PACU  Anesthesia Type:General  Level of Consciousness: awake, alert , oriented and patient cooperative  Airway & Oxygen Therapy: Patient Spontanous Breathing and Patient connected to face mask oxygen  Post-op Assessment: Report given to RN, Post -op Vital signs reviewed and stable and Patient moving all extremities X 4  Post vital signs: Reviewed and stable  Last Vitals:  Vitals Value Taken Time  BP 173/74 03/21/21 1052  Temp    Pulse 86 03/21/21 1054  Resp 15 03/21/21 1054  SpO2 96 % 03/21/21 1054  Vitals shown include unvalidated device data.  Last Pain:  Vitals:   03/21/21 0613  TempSrc:   PainSc: 6       Patients Stated Pain Goal: 0 (18/28/83 3744)  Complications: No notable events documented.

## 2021-03-21 NOTE — Op Note (Signed)
Brief history: The patient is a 71 year old white female who has complained of neck and bilateral arm pain consistent with a cervical radiculopathy.  She has failed medical management and was worked up with a cervical MRI which demonstrated spondylosis and foraminal stenosis most prominent at C4-5, C5-6 and C6-7.  I discussed the various treatment options with her.  She has decided proceed with surgery.  Preoperative diagnosis: C4-5, C5-6 and C6-7 disc degeneration, spondylosis, stenosis, cervicalgia, cervical radiculopathy, cervical myelopathy  Postoperative diagnosis: The same  Procedure: C4-5, C5-6 and C6-7 anterior cervical discectomy/decompression; C4-5, C5-6 and C6-7 interbody arthrodesis with local morcellized autograft bone and Zimmer DBM; insertion of interbody prosthesis at C4-5, C5-6 and C6-7 (Zimmer peek interbody prosthesis); anterior cervical plating from C4-C7 with globus titanium plate  Surgeon: Dr. Earle Gell  Asst.: Arnetha Massy, NP  Anesthesia: Gen. endotracheal  Estimated blood loss: 125 cc  Drains: Jackson-Pratt drain in the prevertebral space  Complications: None  Description of procedure: The patient was brought to the operating room by the anesthesia team. General endotracheal anesthesia was induced. A roll was placed under the patient's shoulders to keep the neck in the neutral position. The patient's anterior cervical region was then prepared with Betadine scrub and Betadine solution. Sterile drapes were applied.  The area to be incised was then injected with Marcaine with epinephrine solution. I then used a scalpel to make a transverse incision in the patient's left anterior neck. I used the Metzenbaum scissors to divide the platysmal muscle and then to dissect medial to the sternocleidomastoid muscle, jugular vein, and carotid artery. I carefully dissected down towards the anterior cervical spine identifying the esophagus and retracting it medially. Then using  Kitner swabs to clear soft tissue from the anterior cervical spine. We then inserted a bent spinal needle into the upper exposed intervertebral disc space. We then obtained intraoperative radiographs confirm our location.  I then used electrocautery to detach the medial border of the longus colli muscle bilaterally from the C4-5, C5-6 and C6-7 intervertebral disc spaces. I then inserted the Caspar self-retaining retractor underneath the longus colli muscle bilaterally to provide exposure.  We then incised the intervertebral disc at C4-5. We then performed a partial intervertebral discectomy with a pituitary forceps and the Karlin curettes. I then inserted distraction screws into the vertebral bodies at C4-5. We then distracted the interspace. We then used the high-speed drill to decorticate the vertebral endplates at U9-3, to drill away the remainder of the intervertebral disc, to drill away some posterior spondylosis, and to thin out the posterior longitudinal ligament. I then incised ligament with the arachnoid knife. We then removed the ligament with a Kerrison punches undercutting the vertebral endplates and decompressing the thecal sac. We then performed foraminotomies about the bilateral C5 nerve roots. This completed the decompression at this level.  We then repeated this procedure in analogous fashion at C5-6 and C6-7 decompressing the thecal sac and the bilateral C6 and C7 nerve roots  We now turned our to attention to the interbody fusion. We used the trial spacers to determine the appropriate size for the interbody prosthesis. We then pre-filled prosthesis with a combination of local morcellized autograft bone that we obtained during decompression as well as Zimmer DBM. We then inserted the prosthesis into the distracted interspace at C4-5, C5-6 and C6-7. We then removed the distraction screws. There was a good snug fit of the prosthesis in the interspace.  Having completed the fusion we now  turned attention to the  anterior spinal instrumentation. We used the high-speed drill to drill away some anterior spondylosis at the disc spaces so that the plate lay down flat. We selected the appropriate length titanium anterior cervical plate. We laid it along the anterior aspect of the vertebral bodies from C4-C7. We then drilled 12 mm holes at C4, C5, C6 and C7. We then secured the plate to the vertebral bodies by placing two 12 mm self-tapping screws at C4, C5, C6 and C7. We then obtained intraoperative radiograph. The demonstrating good position of the upper instrumentation.  The construct looked good in vivo.  We therefore secured the screws the plate the locking each cam. This completed the instrumentation.  We then obtained hemostasis using bipolar electrocautery. We irrigated the wound out with bacitracin solution. We then removed the retractor. We inspected the esophagus for any damage. There was none apparent.  We placed a 10 mm flat Jackson-Pratt drain in the prevertebral space and tunneled it out through a separate stab wound.  We then reapproximated patient's platysmal muscle with interrupted 3-0 Vicryl suture. We then reapproximated the subcutaneous tissue with interrupted 3-0 Vicryl suture. The skin was reapproximated with Steri-Strips and benzoin. The wound was then covered with bacitracin ointment. A sterile dressing was applied. The drapes were removed. Patient was subsequently extubated by the anesthesia team and transported to the post anesthesia care unit in stable condition. All sponge instrument and needle counts were reportedly correct at the end of this case.

## 2021-03-21 NOTE — Progress Notes (Signed)
Orthopedic Tech Progress Note Patient Details:  Maureen Chung January 18, 1951 [id number]   Ortho Devices Type of Ortho Device: Aspen cervical collar Ortho Device/Splint Location: at bedside in PACU Ortho Device/Splint Interventions: Ordered      Carin Primrose 03/21/2021, 12:17 PM

## 2021-03-21 NOTE — H&P (Signed)
Subjective: The patient is a 71 year old white female who is complained of neck and bilateral shoulder and arm pain consistent with a cervical radiculopathy.  She has failed medical management and was worked up with a cervical MRI which demonstrated spondylosis and foraminal stenosis at C4-5, C5-6 and C6-7.  I discussed the various treatment options with her.  She has decided to proceed with surgery.  Past Medical History:  Diagnosis Date   Anemia    Anxiety    Arthritis    Chronic kidney disease, stage 3 (HCC)    Depression    Diastolic dysfunction    Heart murmur    History of kidney stones    Hypercalcemia    Hyperlipidemia    Hyperparathyroidism    Hypertension    Insomnia    Mild CAD    Osteoporosis    Pericarditis     Past Surgical History:  Procedure Laterality Date   BREAST CYST EXCISION  03/18/1995   benign   CARDIAC CATHETERIZATION N/A 10/18/2015   Procedure: Left Heart Cath and Coronary Angiography;  Surgeon: Josue Hector, MD;  Location: [facility];  Service: Cardiovascular;  Laterality: N/A;   child birth  03/17/1988   EYE SURGERY     LAPAROSCOPIC ENDOMETRIOSIS FULGURATION  1989, 1995   PARATHYROIDECTOMY  11/01/2010   TONSILECTOMY, ADENOIDECTOMY, BILATERAL MYRINGOTOMY AND TUBES  03/18/1967   TONSILLECTOMY     VAGINAL HYSTERECTOMY  03/18/1979   without ovaries    Allergies  Allergen Reactions   Latex Rash    Social History   Tobacco Use   Smoking status: Former    Packs/day: 1.00    Years: 41.00    Pack years: 41.00    Types: Cigarettes    Quit date: 11/12/2019    Years since quitting: 1.3   Smokeless tobacco: Never   Tobacco comments:    Encouraged to develop a plan or come to me for assistance when ready to set a date.  Substance Use Topics   Alcohol use: Yes    Alcohol/week: 0.0 standard drinks    Comment: once per day    Family History  Problem Relation Age of Onset   Heart disease Father    Diabetes Father    Cancer Mother         ovarian   Coronary artery disease Brother    Bladder Cancer Brother    Heart disease Brother    Prior to Admission medications   Medication Sig Start Date End Date Taking? Authorizing Provider  acetaminophen (TYLENOL) 325 MG tablet Take 650 mg by mouth every 6 (six) hours as needed for mild pain.   Yes [provider]  aspirin 81 MG EC tablet Take 81 mg by mouth daily.   Yes [provider]  atorvastatin (LIPITOR) 40 MG tablet Take 1 tablet (40 mg total) by mouth daily. 01/24/21 04/24/21 Yes Dunn, Dayna N, PA-C  baclofen (LIORESAL) 10 MG tablet Take 1/2 to 1 by mouth every 8hrs as needed for spasm Patient taking differently: Take 5-10 mg by mouth 3 (three) times daily as needed for muscle spasms. Take 1/2 to 1 by mouth every 8hrs as needed for spasm 02/13/20  Yes Magnus Sinning, MD  cyclobenzaprine (FLEXERIL) 10 MG tablet TAKE 1 TABLET THREE TIMES DAILY AS NEEDED FOR MUSCLE SPASMS. Patient taking differently: Take 10 mg by mouth 3 (three) times daily as needed for muscle spasms. TAKE 1 TABLET THREE TIMES DAILY AS NEEDED FOR MUSCLE SPASMS. 03/05/20  Yes  Magnus Sinning, MD  doxepin (SINEQUAN) 10 MG capsule Take 10 mg by mouth at bedtime as needed (sleep). 03/01/21  Yes [provider]  fluticasone (CUTIVATE) 0.05 % cream Apply 1 application topically daily as needed (irritation).   Yes [provider]  furosemide (LASIX) 20 MG tablet Take 1 tablet (20 mg total) by mouth daily as needed for edema. 02/19/21 05/20/21 Yes Imogene Burn, PA-C  LUMIGAN 0.01 % SOLN Place 1 drop into both eyes at bedtime. 11/28/20  Yes [provider]  Magnesium Oxide (MAG-OXIDE PO) Take 500 mg by mouth daily.   Yes [provider]  metoprolol succinate (TOPROL-XL) 25 MG 24 hr tablet Take 25 mg by mouth daily. 10/14/18  Yes [provider]  metroNIDAZOLE (METROCREAM) 0.75 % cream Apply 1 application topically daily.   Yes [provider]  Multiple  Vitamins-Minerals (PRESERVISION AREDS 2) CAPS Take 1 capsule by mouth in the morning and at bedtime.   Yes [provider]  nicotine (NICODERM CQ - DOSED IN MG/24 HOURS) 21 mg/24hr patch Place 21 mg onto the skin daily as needed (nicotine).   Yes [provider]  pantoprazole (PROTONIX) 40 MG tablet Take 40 mg by mouth daily. 02/01/20  Yes [provider]  sertraline (ZOLOFT) 100 MG tablet Take 200 mg by mouth daily. 08/01/15  Yes [provider]  tacrolimus (PROTOPIC) 0.1 % ointment Apply 1 application topically at bedtime. 02/23/20  Yes [provider]  tetrahydrozoline-zinc (RELIEF EYE DROPS) 0.05-0.25 % ophthalmic solution Place 1 drop into both eyes 3 (three) times daily as needed (dry eyes).   Yes [provider]  triamcinolone cream (KENALOG) 0.1 % Apply 1 application topically daily as needed (irritation).   Yes [provider]  albuterol (VENTOLIN HFA) 108 (90 Base) MCG/ACT inhaler Inhale 2 puffs into the lungs every 6 (six) hours as needed for wheezing or shortness of breath. 12/13/18   Lauraine Rinne, NP  budesonide-formoterol (SYMBICORT) 160-4.5 MCG/ACT inhaler Inhale 2 puffs into the lungs 2 (two) times daily. Patient taking differently: Inhale 2 puffs into the lungs 2 (two) times daily as needed (asthma). 02/03/20   Lauraine Rinne, NP  nitroGLYCERIN (NITROSTAT) 0.4 MG SL tablet Place 1 tablet (0.4 mg total) under the tongue every 5 (five) minutes as needed for chest pain. 09/12/15   Josue Hector, MD  zoledronic acid (RECLAST) 5 MG/100ML SOLN injection See admin instructions.    [provider]     Review of Systems  Positive ROS: As above  All other systems have been reviewed and were otherwise negative with the exception of those mentioned in the HPI and as above.  Objective: Vital signs in last 24 hours: Temp:  [98.8 F (37.1 C)] 98.8 F (37.1 C) (01/05 0608) Pulse Rate:  [62] 62 (01/05 0608) Resp:  [17] 17  (01/05 0608) BP: (174)/(74) 174/74 (01/05 0608) SpO2:  [97 %] 97 % (01/05 0608) Weight:  [60.3 kg] 60.3 kg (01/05 0608) Estimated body mass index is 22.83 kg/m as calculated from the following:   Height as of this encounter: 5\' 4"  (1.626 m).   Weight as of this encounter: 60.3 kg.   General Appearance: Alert Head: Normocephalic, without obvious abnormality, atraumatic Eyes: PERRL, conjunctiva/corneas clear, EOM's intact,    Ears: Normal  Throat: Normal  Neck: Limited cervical range of motion.  Spurling's testing is positive. Back: unremarkable Lungs: Clear to auscultation bilaterally, respirations unlabored Heart: Regular rate and rhythm, no murmur, rub or  gallop Abdomen: Soft, non-tender Extremities: Extremities normal, atraumatic, no cyanosis or edema Skin: unremarkable  NEUROLOGIC:   Mental status: alert and oriented,Motor Exam - grossly normal Sensory Exam - grossly normal Reflexes:  Coordination - grossly normal Gait - grossly normal Balance - grossly normal Cranial Nerves: I: smell Not tested  II: visual acuity  OS: Normal  OD: Normal   II: visual fields Full to confrontation  II: pupils Equal, round, reactive to light  III,VII: ptosis None  III,IV,VI: extraocular muscles  Full ROM  V: mastication Normal  V: facial light touch sensation  Normal  V,VII: corneal reflex  Present  VII: facial muscle function - upper  Normal  VII: facial muscle function - lower Normal  VIII: hearing Not tested  IX: soft palate elevation  Normal  IX,X: gag reflex Present  XI: trapezius strength  5/5  XI: sternocleidomastoid strength 5/5  XI: neck flexion strength  5/5  XII: tongue strength  Normal    Data Review Lab Results  Component Value Date   WBC 9.3 03/19/2021   HGB 11.9 (L) 03/19/2021   HCT 36.4 03/19/2021   MCV 98.9 03/19/2021   PLT 207 03/19/2021   Lab Results  Component Value Date   NA 141 03/19/2021   K 4.0 03/19/2021   CL 108 03/19/2021   CO2 24 03/19/2021    BUN 28 (H) 03/19/2021   CREATININE 0.98 03/19/2021   GLUCOSE 94 03/19/2021   Lab Results  Component Value Date   INR 0.9 10/09/2015    Assessment/Plan: Cervical spondylosis, cervicalgia, cervical radiculopathy: I have discussed the situation with the patient.  I have reviewed her imaging studies with her and pointed out the abnormalities.  We have discussed the various treatment options including surgery.  I have described the surgical treatment option of a C4-5, C5-6 and C6-7 anterior cervical discectomy, fusion and plating.  I have shown her surgical models.  I have given her a surgical pamphlet.  We have discussed the risk, benefits, alternatives, expected postoperative course, and likelihood of achieving our goals with surgery.  I have answered all her questions.  She has decided proceed with surgery.   Ophelia Charter 03/21/2021 7:24 AM

## 2021-03-22 ENCOUNTER — Encounter (HOSPITAL_COMMUNITY): Payer: Self-pay | Admitting: Neurosurgery

## 2021-03-22 MED ORDER — CYCLOBENZAPRINE HCL 10 MG PO TABS: 10.0000 mg | ORAL_TABLET | Freq: Three times a day (TID) | ORAL | 0 refills | Status: AC | PRN

## 2021-03-22 MED ORDER — DOCUSATE SODIUM 100 MG PO CAPS: 100.0000 mg | ORAL_CAPSULE | Freq: Two times a day (BID) | ORAL | 0 refills | Status: AC

## 2021-03-22 MED ORDER — OXYCODONE-ACETAMINOPHEN 5-325 MG PO TABS: 1.0000 | ORAL_TABLET | ORAL | Status: DC | PRN

## 2021-03-22 MED ORDER — OXYCODONE-ACETAMINOPHEN 5-325 MG PO TABS
1.0000 | ORAL_TABLET | ORAL | 0 refills | Status: DC | PRN
Start: 2021-03-22 — End: 2023-02-23

## 2021-03-22 NOTE — Discharge Summary (Signed)
Physician Discharge Summary  Patient ID: Maureen Chung MRN: [id number] DOB/AGE: [birth year] 70 y.o.  Admit date: 03/21/2021 Discharge date: 03/22/2021  Admission Diagnoses: Cervical spondylosis with myelopathy and radiculopathy, cervicalgia  Discharge Diagnoses: The same Principal Problem:   Cervical spondylosis with myelopathy and radiculopathy   Discharged Condition: good  Hospital Course: I performed a C4-5, C5-6 and C6-7 anterior cervical discectomy, fusion and plating on the patient on 03/21/2021.  The surgery went well.  The patient's postoperative course was unremarkable.  On postoperative day #1 she requested discharge home.  The patient, and her husband, were given written and discharge instructions.  All her questions were answered.  Consults: PT, OT, care management Significant Diagnostic Studies: None Treatments: C4-5, C5-6 and C6-7 anterior cervical discectomy, fusion and plating. Discharge Exam: Blood pressure (!) 143/59, pulse 63, temperature 98.7 F (37.1 C), temperature source Oral, resp. rate 18, height 5\' 4"  (1.626 m), weight 60.3 kg, SpO2 92 %. The patient is alert and pleasant.  She looks well.  Her dressing has a bloodstain.  There is no hematoma or shift.  Her strength is normal.  Disposition: Home  Discharge Instructions     Call MD for:  difficulty breathing, headache or visual disturbances   Complete by: As directed    Call MD for:  extreme fatigue   Complete by: As directed    Call MD for:  hives   Complete by: As directed    Call MD for:  persistant dizziness or light-headedness   Complete by: As directed    Call MD for:  persistant nausea and vomiting   Complete by: As directed    Call MD for:  redness, tenderness, or signs of infection (pain, swelling, redness, odor or green/yellow discharge around incision site)   Complete by: As directed    Call MD for:  severe uncontrolled pain   Complete by: As directed    Call MD for:  temperature >100.4    Complete by: As directed    Diet - low sodium heart healthy   Complete by: As directed    Discharge instructions   Complete by: As directed    Call [phone] for a followup appointment. Take a stool softener while you are using pain medications.   Driving Restrictions   Complete by: As directed    Do not drive for 2 weeks.   Increase activity slowly   Complete by: As directed    Lifting restrictions   Complete by: As directed    Do not lift more than 5 pounds. No excessive bending or twisting.   May shower / Bathe   Complete by: As directed    Remove the dressing for 3 days after surgery.  You may shower, but leave the incision alone.   Remove dressing in 48 hours   Complete by: As directed       Allergies as of 03/22/2021       Reactions   Latex Rash        Medication List     STOP taking these medications    acetaminophen 325 MG tablet Commonly known as: TYLENOL       TAKE these medications    albuterol 108 (90 Base) MCG/ACT inhaler Commonly known as: VENTOLIN HFA Inhale 2 puffs into the lungs every 6 (six) hours as needed for wheezing or shortness of breath.   aspirin 81 MG EC tablet Take 81 mg by mouth daily.   atorvastatin 40 MG tablet Commonly known as: LIPITOR  Take 1 tablet (40 mg total) by mouth daily.   baclofen 10 MG tablet Commonly known as: LIORESAL Take 1/2 to 1 by mouth every 8hrs as needed for spasm What changed:  how much to take how to take this when to take this reasons to take this   budesonide-formoterol 160-4.5 MCG/ACT inhaler Commonly known as: Symbicort Inhale 2 puffs into the lungs 2 (two) times daily. What changed:  when to take this reasons to take this   cyclobenzaprine 10 MG tablet Commonly known as: FLEXERIL Take 1 tablet (10 mg total) by mouth 3 (three) times daily as needed for muscle spasms. TAKE 1 TABLET THREE TIMES DAILY AS NEEDED FOR MUSCLE SPASMS. What changed: See the new instructions.   docusate sodium  100 MG capsule Commonly known as: COLACE Take 1 capsule (100 mg total) by mouth 2 (two) times daily.   doxepin 10 MG capsule Commonly known as: SINEQUAN Take 10 mg by mouth at bedtime as needed (sleep).   fluticasone 0.05 % cream Commonly known as: CUTIVATE Apply 1 application topically daily as needed (irritation).   furosemide 20 MG tablet Commonly known as: LASIX Take 1 tablet (20 mg total) by mouth daily as needed for edema.   Lumigan 0.01 % Soln Generic drug: bimatoprost Place 1 drop into both eyes at bedtime.   MAG-OXIDE PO Take 500 mg by mouth daily.   metoprolol succinate 25 MG 24 hr tablet Commonly known as: TOPROL-XL Take 25 mg by mouth daily.   metroNIDAZOLE 0.75 % cream Commonly known as: METROCREAM Apply 1 application topically daily.   nicotine 21 mg/24hr patch Commonly known as: NICODERM CQ - dosed in mg/24 hours Place 21 mg onto the skin daily as needed (nicotine).   nitroGLYCERIN 0.4 MG SL tablet Commonly known as: Nitrostat Place 1 tablet (0.4 mg total) under the tongue every 5 (five) minutes as needed for chest pain.   oxyCODONE-acetaminophen 5-325 MG tablet Commonly known as: PERCOCET/ROXICET Take 1-2 tablets by mouth every 4 (four) hours as needed for moderate pain.   pantoprazole 40 MG tablet Commonly known as: PROTONIX Take 40 mg by mouth daily.   PreserVision AREDS 2 Caps Take 1 capsule by mouth in the morning and at bedtime.   Relief Eye Drops 0.05-0.25 % ophthalmic solution Generic drug: tetrahydrozoline-zinc Place 1 drop into both eyes 3 (three) times daily as needed (dry eyes).   sertraline 100 MG tablet Commonly known as: ZOLOFT Take 200 mg by mouth daily.   tacrolimus 0.1 % ointment Commonly known as: PROTOPIC Apply 1 application topically at bedtime.   triamcinolone cream 0.1 % Commonly known as: KENALOG Apply 1 application topically daily as needed (irritation).   zoledronic acid 5 MG/100ML Soln injection Commonly  known as: RECLAST See admin instructions.         Signed: Ophelia Charter 03/22/2021, 11:20 AM

## 2021-03-22 NOTE — Evaluation (Signed)
Occupational Therapy Evaluation Patient Details Name: Maureen Chung MRN: [id number] DOB: 1950/11/09 Today's Date: 03/22/2021   History of Present Illness 71 yo F s/p ACDF, IP, PLATE/SCREWS C45, C56, C67.  PMH includes CAD and HTN.   Clinical Impression   Patient admitted for the above procedure.  Patient is experiencing mild discomfort, but otherwise at or near her baseline for mobility and self care.  No further acute needs, and recommend home when cleared medically.  All precautions reviewed, good teach back, and all questions answered.        Recommendations for follow up therapy are one component of a multi-disciplinary discharge planning process, led by the attending physician.  Recommendations may be updated based on patient status, additional functional criteria and insurance authorization.   Follow Up Recommendations  No OT follow up    Assistance Recommended at Discharge None  Patient can return home with the following      Functional Status Assessment  Patient has not had a recent decline in their functional status  Equipment Recommendations  None recommended by OT    Recommendations for Other Services       Precautions / Restrictions Precautions Precautions: Cervical Precaution Booklet Issued: Yes (comment) Required Braces or Orthoses: Cervical Brace Cervical Brace: Hard collar;For comfort Restrictions Weight Bearing Restrictions: No      Mobility Bed Mobility Overal bed mobility: Modified Independent                  Transfers Overall transfer level: Modified independent                        Balance Overall balance assessment: No apparent balance deficits (not formally assessed)                                         ADL either performed or assessed with clinical judgement   ADL Overall ADL's : At baseline                                             Vision Baseline Vision/History: 1 Wears  glasses Patient Visual Report: No change from baseline       Perception Perception Perception: Not tested   Praxis Praxis Praxis: Not tested    Pertinent Vitals/Pain Pain Assessment: Faces Faces Pain Scale: Hurts a little bit Pain Location: upper traps and neck Pain Descriptors / Indicators: Aching Pain Intervention(s): Monitored during session     Hand Dominance Right   Extremity/Trunk Assessment Upper Extremity Assessment Upper Extremity Assessment: Overall WFL for tasks assessed   Lower Extremity Assessment Lower Extremity Assessment: Overall WFL for tasks assessed   Cervical / Trunk Assessment Cervical / Trunk Assessment: Neck Surgery   Communication Communication Communication: No difficulties   Cognition Arousal/Alertness: Awake/alert Behavior During Therapy: WFL for tasks assessed/performed Overall Cognitive Status: Within Functional Limits for tasks assessed                                       General Comments       Exercises     Shoulder Instructions      Home Living Family/patient expects to be discharged to:: Private  residence Living Arrangements: Spouse/significant other Available Help at Discharge: Family;Available 24 hours/day Type of Home: House Home Access: Stairs to enter [employer] of Steps: 3   Home Layout: One level     Bathroom Shower/Tub: Tub/shower unit;Walk-in shower   Bathroom Toilet: Standard Bathroom Accessibility: No   Home Equipment: None          Prior Functioning/Environment Prior Level of Function : Independent/Modified Independent                        OT Problem List: Pain      OT Treatment/Interventions:      OT Goals(Current goals can be found in the care plan section) Acute Rehab OT Goals Patient Stated Goal: Return home OT Goal Formulation: With patient Time For Goal Achievement: 03/23/21 Potential to Achieve Goals: Good  OT Frequency:      Co-evaluation               AM-PAC OT "6 Clicks" Daily Activity     Outcome Measure Help from another person eating meals?: None Help from another person taking care of personal grooming?: None Help from another person toileting, which includes using toliet, bedpan, or urinal?: None Help from another person bathing (including washing, rinsing, drying)?: None Help from another person to put on and taking off regular upper body clothing?: None Help from another person to put on and taking off regular lower body clothing?: None 6 Click Score: 24   End of Session    Activity Tolerance: Patient tolerated treatment well Patient left: in chair;with call bell/phone within reach  OT Visit Diagnosis: Pain                Time: 0100-7121 OT Time Calculation (min): 26 min Charges:  OT General Charges $OT Visit: 1 Visit OT Evaluation $OT Eval Moderate Complexity: 1 Mod OT Treatments $Self Care/Home Management : 8-22 mins  03/22/2021  RP, OTR/L  Acute Rehabilitation Services  Office:  [phone]   Maureen Chung 03/22/2021, 9:27 AM

## 2021-03-22 NOTE — Progress Notes (Signed)
Patient awaiting transport via wheelchair by RN for discharge home; in no acute distress nor complaints of pain nor discomfort; incision on his neck with honeycomb d dressing recently changed and is clean, dry and intact with Aspen collar on; room was checked and accounted for all her belongings; discharge instructions concerning her medications, incision care, follow up appointment and when to call the doctor as needed were all discussed with patient and her husband by RN and both expressed understanding on the instructions given.

## 2021-03-22 NOTE — Plan of Care (Signed)

## 2021-03-25 DIAGNOSIS — I10 Essential (primary) hypertension: Secondary | ICD-10-CM | POA: Diagnosis not present

## 2021-03-25 DIAGNOSIS — Z7982 Long term (current) use of aspirin: Secondary | ICD-10-CM | POA: Diagnosis not present

## 2021-03-25 DIAGNOSIS — L209 Atopic dermatitis, unspecified: Secondary | ICD-10-CM | POA: Diagnosis not present

## 2021-03-25 DIAGNOSIS — K219 Gastro-esophageal reflux disease without esophagitis: Secondary | ICD-10-CM | POA: Diagnosis not present

## 2021-03-25 DIAGNOSIS — H409 Unspecified glaucoma: Secondary | ICD-10-CM | POA: Diagnosis not present

## 2021-03-25 DIAGNOSIS — Z48811 Encounter for surgical aftercare following surgery on the nervous system: Secondary | ICD-10-CM | POA: Diagnosis not present

## 2021-03-25 DIAGNOSIS — F32A Depression, unspecified: Secondary | ICD-10-CM | POA: Diagnosis not present

## 2021-03-25 DIAGNOSIS — E785 Hyperlipidemia, unspecified: Secondary | ICD-10-CM | POA: Diagnosis not present

## 2021-03-27 DIAGNOSIS — E785 Hyperlipidemia, unspecified: Secondary | ICD-10-CM | POA: Diagnosis not present

## 2021-03-27 DIAGNOSIS — F32A Depression, unspecified: Secondary | ICD-10-CM | POA: Diagnosis not present

## 2021-03-27 DIAGNOSIS — H409 Unspecified glaucoma: Secondary | ICD-10-CM | POA: Diagnosis not present

## 2021-03-27 DIAGNOSIS — I10 Essential (primary) hypertension: Secondary | ICD-10-CM | POA: Diagnosis not present

## 2021-03-27 DIAGNOSIS — Z48811 Encounter for surgical aftercare following surgery on the nervous system: Secondary | ICD-10-CM | POA: Diagnosis not present

## 2021-03-27 DIAGNOSIS — K219 Gastro-esophageal reflux disease without esophagitis: Secondary | ICD-10-CM | POA: Diagnosis not present

## 2021-03-29 DIAGNOSIS — Z48811 Encounter for surgical aftercare following surgery on the nervous system: Secondary | ICD-10-CM | POA: Diagnosis not present

## 2021-03-29 DIAGNOSIS — K219 Gastro-esophageal reflux disease without esophagitis: Secondary | ICD-10-CM | POA: Diagnosis not present

## 2021-03-29 DIAGNOSIS — F32A Depression, unspecified: Secondary | ICD-10-CM | POA: Diagnosis not present

## 2021-03-29 DIAGNOSIS — E785 Hyperlipidemia, unspecified: Secondary | ICD-10-CM | POA: Diagnosis not present

## 2021-03-29 DIAGNOSIS — I10 Essential (primary) hypertension: Secondary | ICD-10-CM | POA: Diagnosis not present

## 2021-03-29 DIAGNOSIS — H409 Unspecified glaucoma: Secondary | ICD-10-CM | POA: Diagnosis not present

## 2021-04-02 DIAGNOSIS — Z48811 Encounter for surgical aftercare following surgery on the nervous system: Secondary | ICD-10-CM | POA: Diagnosis not present

## 2021-04-02 DIAGNOSIS — H409 Unspecified glaucoma: Secondary | ICD-10-CM | POA: Diagnosis not present

## 2021-04-02 DIAGNOSIS — F32A Depression, unspecified: Secondary | ICD-10-CM | POA: Diagnosis not present

## 2021-04-02 DIAGNOSIS — E785 Hyperlipidemia, unspecified: Secondary | ICD-10-CM | POA: Diagnosis not present

## 2021-04-02 DIAGNOSIS — I10 Essential (primary) hypertension: Secondary | ICD-10-CM | POA: Diagnosis not present

## 2021-04-02 DIAGNOSIS — K219 Gastro-esophageal reflux disease without esophagitis: Secondary | ICD-10-CM | POA: Diagnosis not present

## 2021-04-03 DIAGNOSIS — F32A Depression, unspecified: Secondary | ICD-10-CM | POA: Diagnosis not present

## 2021-04-03 DIAGNOSIS — K219 Gastro-esophageal reflux disease without esophagitis: Secondary | ICD-10-CM | POA: Diagnosis not present

## 2021-04-03 DIAGNOSIS — E785 Hyperlipidemia, unspecified: Secondary | ICD-10-CM | POA: Diagnosis not present

## 2021-04-03 DIAGNOSIS — H409 Unspecified glaucoma: Secondary | ICD-10-CM | POA: Diagnosis not present

## 2021-04-03 DIAGNOSIS — Z48811 Encounter for surgical aftercare following surgery on the nervous system: Secondary | ICD-10-CM | POA: Diagnosis not present

## 2021-04-03 DIAGNOSIS — I10 Essential (primary) hypertension: Secondary | ICD-10-CM | POA: Diagnosis not present

## 2021-04-05 DIAGNOSIS — F32A Depression, unspecified: Secondary | ICD-10-CM | POA: Diagnosis not present

## 2021-04-05 DIAGNOSIS — K219 Gastro-esophageal reflux disease without esophagitis: Secondary | ICD-10-CM | POA: Diagnosis not present

## 2021-04-05 DIAGNOSIS — I10 Essential (primary) hypertension: Secondary | ICD-10-CM | POA: Diagnosis not present

## 2021-04-05 DIAGNOSIS — H409 Unspecified glaucoma: Secondary | ICD-10-CM | POA: Diagnosis not present

## 2021-04-05 DIAGNOSIS — Z48811 Encounter for surgical aftercare following surgery on the nervous system: Secondary | ICD-10-CM | POA: Diagnosis not present

## 2021-04-05 DIAGNOSIS — E785 Hyperlipidemia, unspecified: Secondary | ICD-10-CM | POA: Diagnosis not present

## 2021-04-12 DIAGNOSIS — M542 Cervicalgia: Secondary | ICD-10-CM | POA: Diagnosis not present

## 2021-04-12 DIAGNOSIS — M4712 Other spondylosis with myelopathy, cervical region: Secondary | ICD-10-CM | POA: Diagnosis not present

## 2021-04-17 ENCOUNTER — Ambulatory Visit: Payer: Medicare Other | Admitting: Cardiovascular Disease

## 2021-04-22 DIAGNOSIS — H401131 Primary open-angle glaucoma, bilateral, mild stage: Secondary | ICD-10-CM | POA: Diagnosis not present

## 2021-05-17 DIAGNOSIS — R269 Unspecified abnormalities of gait and mobility: Secondary | ICD-10-CM | POA: Diagnosis not present

## 2021-05-17 DIAGNOSIS — M542 Cervicalgia: Secondary | ICD-10-CM | POA: Diagnosis not present

## 2021-05-23 DIAGNOSIS — M542 Cervicalgia: Secondary | ICD-10-CM | POA: Diagnosis not present

## 2021-05-23 DIAGNOSIS — R269 Unspecified abnormalities of gait and mobility: Secondary | ICD-10-CM | POA: Diagnosis not present

## 2021-05-31 DIAGNOSIS — R269 Unspecified abnormalities of gait and mobility: Secondary | ICD-10-CM | POA: Diagnosis not present

## 2021-05-31 DIAGNOSIS — M542 Cervicalgia: Secondary | ICD-10-CM | POA: Diagnosis not present

## 2021-06-12 DIAGNOSIS — E7800 Pure hypercholesterolemia, unspecified: Secondary | ICD-10-CM | POA: Diagnosis not present

## 2021-06-12 DIAGNOSIS — M542 Cervicalgia: Secondary | ICD-10-CM | POA: Diagnosis not present

## 2021-06-12 DIAGNOSIS — R269 Unspecified abnormalities of gait and mobility: Secondary | ICD-10-CM | POA: Diagnosis not present

## 2021-06-12 DIAGNOSIS — K219 Gastro-esophageal reflux disease without esophagitis: Secondary | ICD-10-CM | POA: Diagnosis not present

## 2021-06-14 DIAGNOSIS — M542 Cervicalgia: Secondary | ICD-10-CM | POA: Diagnosis not present

## 2021-06-14 DIAGNOSIS — R269 Unspecified abnormalities of gait and mobility: Secondary | ICD-10-CM | POA: Diagnosis not present

## 2021-06-28 DIAGNOSIS — M542 Cervicalgia: Secondary | ICD-10-CM | POA: Diagnosis not present

## 2021-06-28 DIAGNOSIS — R269 Unspecified abnormalities of gait and mobility: Secondary | ICD-10-CM | POA: Diagnosis not present

## 2021-07-03 DIAGNOSIS — M542 Cervicalgia: Secondary | ICD-10-CM | POA: Diagnosis not present

## 2021-07-03 DIAGNOSIS — R269 Unspecified abnormalities of gait and mobility: Secondary | ICD-10-CM | POA: Diagnosis not present

## 2021-07-05 DIAGNOSIS — R269 Unspecified abnormalities of gait and mobility: Secondary | ICD-10-CM | POA: Diagnosis not present

## 2021-07-05 DIAGNOSIS — M542 Cervicalgia: Secondary | ICD-10-CM | POA: Diagnosis not present

## 2021-07-09 DIAGNOSIS — R269 Unspecified abnormalities of gait and mobility: Secondary | ICD-10-CM | POA: Diagnosis not present

## 2021-07-09 DIAGNOSIS — M4712 Other spondylosis with myelopathy, cervical region: Secondary | ICD-10-CM | POA: Diagnosis not present

## 2021-07-09 DIAGNOSIS — M542 Cervicalgia: Secondary | ICD-10-CM | POA: Diagnosis not present

## 2021-07-11 ENCOUNTER — Telehealth: Payer: Self-pay | Admitting: Acute Care

## 2021-07-11 NOTE — Telephone Encounter (Signed)
Left voicemail with call back number for patient to call to schedule annual LDCT  

## 2021-07-16 ENCOUNTER — Telehealth: Payer: Self-pay | Admitting: Physician Assistant

## 2021-07-16 NOTE — Telephone Encounter (Signed)
? ?  Please let pt know we received a letter in the mail from [place] letting us know that one of her prescriptions, atorvastatin, was part of a recall issued by a company called "[employer]" that makes this medicine. The recall states there were deviations from Current Good Manufacturing Practices identified during an FDA inspection, but provides no other information. Since this is a pharmacy problem, she should call her pharmacy to find out if they have rectified the issue with the supplier to ensure the version they filled does not need to be replaced. It was not clear to me from the letter whether they were also informing the patient at point of sale as well.  ? ?Melina Copa PA-C ? ?

## 2021-07-16 NOTE — Telephone Encounter (Signed)
Left message for the patient to contact the office. 

## 2021-07-18 DIAGNOSIS — R269 Unspecified abnormalities of gait and mobility: Secondary | ICD-10-CM | POA: Diagnosis not present

## 2021-07-18 DIAGNOSIS — M542 Cervicalgia: Secondary | ICD-10-CM | POA: Diagnosis not present

## 2021-07-18 NOTE — Telephone Encounter (Signed)
Left detailed message with Dayna's recommendations about recall on medication. Advised the patient to contact her pharmacy with questions or concerns.  ?

## 2021-07-25 DIAGNOSIS — R269 Unspecified abnormalities of gait and mobility: Secondary | ICD-10-CM | POA: Diagnosis not present

## 2021-07-25 DIAGNOSIS — M542 Cervicalgia: Secondary | ICD-10-CM | POA: Diagnosis not present

## 2021-08-02 ENCOUNTER — Encounter: Payer: Self-pay | Admitting: Surgical

## 2021-08-02 ENCOUNTER — Ambulatory Visit (INDEPENDENT_AMBULATORY_CARE_PROVIDER_SITE_OTHER): Payer: Medicare Other

## 2021-08-02 ENCOUNTER — Ambulatory Visit (INDEPENDENT_AMBULATORY_CARE_PROVIDER_SITE_OTHER): Payer: Medicare Other | Admitting: Surgical

## 2021-08-02 DIAGNOSIS — M25562 Pain in left knee: Secondary | ICD-10-CM | POA: Diagnosis not present

## 2021-08-02 DIAGNOSIS — M1711 Unilateral primary osteoarthritis, right knee: Secondary | ICD-10-CM

## 2021-08-02 DIAGNOSIS — M1712 Unilateral primary osteoarthritis, left knee: Secondary | ICD-10-CM

## 2021-08-02 DIAGNOSIS — M170 Bilateral primary osteoarthritis of knee: Secondary | ICD-10-CM | POA: Diagnosis not present

## 2021-08-02 NOTE — Progress Notes (Signed)
Office Visit Note   Patient: Maureen Chung           Date of Birth: 02-Jul-1950           MRN: [id number] Visit Date: 08/02/2021 Requested by: London Pepper, MD [address],  [place] [id number] PCP: London Pepper, MD  Subjective: Chief Complaint  Patient presents with   Left Knee - Pain    HPI: Maureen Chung is a 71 y.o. female who presents to the office complaining of bilateral knee pain, left greater than right.  Patient fell in February onto her anterior left knee while chasing after.  Since then she has had 1 spot that has been tender to the touch.  No increased pain with walking.  No groin pain.  No numbness or tingling.  Pain does not wake her up at night.  She also has history of bilateral knee osteoarthritis primarily in the patellofemoral compartments which she has received bilateral knee injections for in the past.  She would like to repeat these injections today.              ROS: All systems reviewed are negative as they relate to the chief complaint within the history of present illness.  Patient denies fevers or chills.  Assessment & Plan: Visit Diagnoses:  1. Left knee pain, unspecified chronicity   2. Unilateral primary osteoarthritis, left knee   3. Unilateral primary osteoarthritis, right knee     Plan: Patient is a 71 year old female who presents for evaluation of bilateral knee pain.  Has history of bilateral knee patellofemoral arthritis.  Would like to repeat knee injections today.  No history of diabetes.  Tolerated both injections well.  Radiographs of the left knee show no acute injury from fall in February 2023 when she was chasing a cat.  She does have a tender small rubbery nodule that is felt on exam.  Ultrasound probe showed that it is superficial to the patella and does not appear to be a cyst.  Since her symptoms are improving as she gets further and further out from injury and the nodule is becoming less noticeable,  recommended she try applying Voltaren gel topically to this area several times per day.  Follow-up in 1 month for clinical recheck with Dr. Marlou Sa for his evaluation.  1 possibility would be traumatic neuroma from her fall.  Follow-Up Instructions: No follow-ups on file.   Orders:  Orders Placed This Encounter  Procedures   XR KNEE 3 VIEW LEFT   No orders of the defined types were placed in this encounter.     Procedures: Large Joint Inj: bilateral knee on 08/02/2021 1:40 PM Indications: diagnostic evaluation, joint swelling and pain Details: 18 G 1.5 in needle, superolateral approach  Arthrogram: No  Medications (Right): 5 mL lidocaine 1 %; 4 mL bupivacaine 0.25 %; 40 mg methylPREDNISolone acetate 40 MG/ML Medications (Left): 5 mL lidocaine 1 %; 4 mL bupivacaine 0.25 %; 40 mg methylPREDNISolone acetate 40 MG/ML Outcome: tolerated well, no immediate complications Procedure, treatment alternatives, risks and benefits explained, specific risks discussed. Consent was given by the patient. Immediately prior to procedure a time out was called to verify the correct patient, procedure, equipment, support staff and site/side marked as required. Patient was prepped and draped in the usual sterile fashion.      Clinical Data: No additional findings.  Objective: Vital Signs: There were no vitals taken for this visit.  Physical Exam:  Constitutional: Patient appears  well-developed HEENT:  Head: Normocephalic Eyes:EOM are normal Neck: Normal range of motion Cardiovascular: Normal rate Pulmonary/chest: Effort normal Neurologic: Patient is alert Skin: Skin is warm Psychiatric: Patient has normal mood and affect  Ortho Exam: Ortho exam demonstrates bilateral knees without effusion.  No cellulitis or skin changes noted.  No tenderness over the medial or lateral joint lines of either knee.  Positive patellar grind test.  No fluctuance noted in the prepatellar bursa bilaterally.  No calf  tenderness.  Negative Homans' sign.  Patient does have a small mobile rubbery mass superficial to the inferior patella that is appreciated.  She has tenderness with mobility and compression of this mass.  There is no tenderness over the underlying patella when the mass is mobilized out of the way.  She is able to perform straight leg raise with both legs.  Specialty Comments:  No specialty comments available.  Imaging: No results found.   PMFS History: Patient Active Problem List   Diagnosis Date Noted   Cervical spondylosis with myelopathy and radiculopathy 03/21/2021   Abnormal findings on diagnostic imaging of lung 12/13/2018   Healthcare maintenance 12/13/2018   Former smoker 11/16/2018   COPD GOLD 0  11/15/2018   Chest pain 10/18/2015   Hyperparathyroidism, primary ([place]) 11/27/2010   Past Medical History:  Diagnosis Date   Anemia    Anxiety    Arthritis    Chronic kidney disease, stage 3 (HCC)    Depression    Diastolic dysfunction    Heart murmur    History of kidney stones    Hypercalcemia    Hyperlipidemia    Hyperparathyroidism    Hypertension    Insomnia    Mild CAD    Osteoporosis    Pericarditis     Family History  Problem Relation Age of Onset   Heart disease Father    Diabetes Father    Cancer Mother        ovarian   Coronary artery disease Brother    Bladder Cancer Brother    Heart disease Brother     Past Surgical History:  Procedure Laterality Date   ANTERIOR CERVICAL DECOMP/DISCECTOMY FUSION N/A 03/21/2021   Procedure: ACDF, IP, PLATE/SCREWS C45, C56, C67;  Surgeon: Newman Pies, MD;  Location: [facility];  Service: Neurosurgery;  Laterality: N/A;   BREAST CYST EXCISION  03/18/1995   benign   CARDIAC CATHETERIZATION N/A 10/18/2015   Procedure: Left Heart Cath and Coronary Angiography;  Surgeon: Josue Hector, MD;  Location: [facility];  Service: Cardiovascular;  Laterality: N/A;   child birth  [birth year]/17[birth year]1989   EYE SURGERY     LAPAROSCOPIC  ENDOMETRIOSIS FULGURATION  1989, 1995   PARATHYROIDECTOMY  11/01/2010   TONSILECTOMY, ADENOIDECTOMY, BILATERAL MYRINGOTOMY AND TUBES  03/18/1967   TONSILLECTOMY     VAGINAL HYSTERECTOMY  03/18/1979   without ovaries   Social History   Occupational History   Not on file  Tobacco Use   Smoking status: Former    Packs/day: 1.00    Years: 41.00    Pack years: 41.00    Types: Cigarettes    Quit date: 11/12/2019    Years since quitting: 1.7   Smokeless tobacco: Never   Tobacco comments:    Encouraged to develop a plan or come to me for assistance when ready to set a date.  Vaping Use   Vaping Use: Never used  Substance and Sexual Activity   Alcohol use: Yes    Alcohol/week: 0.0 standard drinks  Comment: once per day   Drug use: No   Sexual activity: Not on file

## 2021-08-07 MED ORDER — METHYLPREDNISOLONE ACETATE 40 MG/ML IJ SUSP
40.0000 mg | INTRAMUSCULAR | Status: AC | PRN
  Administered 2021-08-02: 40 mg via INTRA_ARTICULAR

## 2021-08-07 MED ORDER — BUPIVACAINE HCL 0.25 % IJ SOLN
4.0000 mL | INTRAMUSCULAR | Status: AC | PRN
  Administered 2021-08-02: 4 mL via INTRA_ARTICULAR

## 2021-08-07 MED ORDER — LIDOCAINE HCL 1 % IJ SOLN
5.0000 mL | INTRAMUSCULAR | Status: AC | PRN
  Administered 2021-08-02: 5 mL

## 2021-08-08 DIAGNOSIS — R269 Unspecified abnormalities of gait and mobility: Secondary | ICD-10-CM | POA: Diagnosis not present

## 2021-08-08 DIAGNOSIS — M542 Cervicalgia: Secondary | ICD-10-CM | POA: Diagnosis not present

## 2021-08-14 DIAGNOSIS — R269 Unspecified abnormalities of gait and mobility: Secondary | ICD-10-CM | POA: Diagnosis not present

## 2021-08-14 DIAGNOSIS — M542 Cervicalgia: Secondary | ICD-10-CM | POA: Diagnosis not present

## 2021-08-21 DIAGNOSIS — H401131 Primary open-angle glaucoma, bilateral, mild stage: Secondary | ICD-10-CM | POA: Diagnosis not present

## 2021-08-22 DIAGNOSIS — M542 Cervicalgia: Secondary | ICD-10-CM | POA: Diagnosis not present

## 2021-08-22 DIAGNOSIS — R269 Unspecified abnormalities of gait and mobility: Secondary | ICD-10-CM | POA: Diagnosis not present

## 2021-08-29 DIAGNOSIS — M542 Cervicalgia: Secondary | ICD-10-CM | POA: Diagnosis not present

## 2021-08-29 DIAGNOSIS — R269 Unspecified abnormalities of gait and mobility: Secondary | ICD-10-CM | POA: Diagnosis not present

## 2021-08-30 NOTE — Progress Notes (Unsigned)
Cardiology Office Note    Date:  08/30/2021   ID:  CAMERYN SCHUM, DOB 1950/05/14, MRN [id number]  PCP:  London Pepper, MD  Cardiologist:  Jenkins Rouge, MD  Electrophysiologist:  None   Chief Complaint: preop, also reports recent shortness of breath  History of Present Illness:   Maureen Chung is a 71 y.o. female with history of tobacco use, minimal CAD by cath 2017, HLD (followed by PCP), possible CKD stage III (borderline a-b), hyperparathyroidism, anxiety, arthritis, pulmonary nodules who presents for follow-up.   False positive myovue  with cath 10/2015 showing only 20% LAD otherwise normal. She also has prior history of dyspnea/fatigue with elevated BNP prompting Lasix in 06/6268 (? Diastolic CHF).  Echo 10/2018 EF >65%, impaired diastolic relaxation, otherwise no significant abnormalities. She has benign pulmonary nodule stable by CT 05/2020   She was seen in the ED 04/2020 for near-syncope with workup felt reassuring. She reports this occurred in the setting of a hot shower and she couldn't catch her breath. Her WBC was elevated with recommendation for f/u primary care.  Cardiac CTA 01/28/21 CAD RADS 2 proximal LAD/LCX 25-49% with calcium score 116 which was 76 th percentile for age/sex   She has known left subclavian stenosis without symptoms Peak velocity 4.96 cm/sec with atypical but not reversed vertebral flow on [place] 01/30/21 Only 10 mm Hg gradient between the arms No intervention needed   Had complicated multi level C spine surgery with Dr Arnoldo Morale on 03/21/21  Mechanical fall left knee and had injection by [facility] on 08/02/21  ***  Past Medical History:  Diagnosis Date   Anemia    Anxiety    Arthritis    Chronic kidney disease, stage 3 ([place])    Depression    Diastolic dysfunction    Heart murmur    History of kidney stones    Hypercalcemia    Hyperlipidemia    Hyperparathyroidism    Hypertension    Insomnia    Mild CAD    Osteoporosis    Pericarditis      Past Surgical History:  Procedure Laterality Date   ANTERIOR CERVICAL DECOMP/DISCECTOMY FUSION N/A 03/21/2021   Procedure: ACDF, IP, PLATE/SCREWS C45, C56, C67;  Surgeon: Newman Pies, MD;  Location: [facility];  Service: Neurosurgery;  Laterality: N/A;   BREAST CYST EXCISION  03/18/1995   benign   CARDIAC CATHETERIZATION N/A 10/18/2015   Procedure: Left Heart Cath and Coronary Angiography;  Surgeon: Josue Hector, MD;  Location: [facility];  Service: Cardiovascular;  Laterality: N/A;   child birth  [birth year]1989   EYE SURGERY     LAPAROSCOPIC ENDOMETRIOSIS FULGURATION  1989, 1995   PARATHYROIDECTOMY  11/01/2010   TONSILECTOMY, ADENOIDECTOMY, BILATERAL MYRINGOTOMY AND TUBES  03/18/1967   TONSILLECTOMY     VAGINAL HYSTERECTOMY  03/18/1979   without ovaries    Current Medications: No outpatient medications have been marked as taking for the 09/10/21 encounter (Appointment) with Josue Hector, MD.     Allergies:   Latex   Social History   Socioeconomic History   Marital status: Married    Spouse name: Not on file   Number of children: Not on file   Years of education: Not on file   Highest education level: Not on file  Occupational History   Not on file  Tobacco Use   Smoking status: Former    Packs/day: 1.00    Years: 41.00    Total pack years: 41.00  Types: Cigarettes    Quit date: 11/12/2019    Years since quitting: 1.8   Smokeless tobacco: Never   Tobacco comments:    Encouraged to develop a plan or come to me for assistance when ready to set a date.  Vaping Use   Vaping Use: Never used  Substance and Sexual Activity   Alcohol use: Yes    Alcohol/week: 0.0 standard drinks of alcohol    Comment: once per day   Drug use: No   Sexual activity: Not on file  Other Topics Concern   Not on file  Social History Narrative   Not on file   Social Determinants of Health   Financial Resource Strain: Not on file  Food Insecurity: Not on file  Transportation  Needs: Not on file  Physical Activity: Not on file  Stress: Not on file  Social Connections: Not on file     Family History:  The patient's family history includes Bladder Cancer in her brother; Cancer in her mother; Coronary artery disease in her brother; Diabetes in her father; Heart disease in her brother and father.  ROS:   Please see the history of present illness. Otherwise, review of systems is positive for noticing lower BP after taking cumin seed supplement. We discussed it is difficult to know if this is safe since it is uncontrolled suppplement and no trials. All other systems are reviewed and otherwise negative.    EKGs/Labs/Other Studies Reviewed:    Studies reviewed are outlined and summarized above. Reports included below if pertinent.  2D echo  02/12/21  eft ventricular ejection fraction by 3D volume is 59 %. The left ventricle has normal function. The left ventricle has no regional wall motion abnormalities. Left ventricular diastolic parameters are consistent with Grade I diastolic dysfunction (impaired relaxation). The average left ventricular global longitudinal strain is -20.4 %. The global longitudinal strain is normal. 1. 2. Right ventricular systolic function is normal. The right ventricular size is normal. The mitral valve is normal in structure. Mild mitral valve regurgitation. No evidence of mitral stenosis. 3. The aortic valve is normal in structure. Aortic valve regurgitation is not visualized. No aortic stenosis is present. 4. The inferior vena cava is normal in size with  Cath 10/2015 Prox LAD to Mid LAD lesion, 20 %stenosed.   No significant CAD Mild calcification of proximal LAD Normal EF 65%    EKG:  EKG is ordered today, personally reviewed, demonstrating NSR 60bpm no acute change from prior  Recent Labs: 01/16/2021: ALT 10; NT-Pro BNP 891; TSH 3.280 03/19/2021: BUN 28; Creatinine, Ser 0.98; Hemoglobin 11.9; Platelets 207; Potassium 4.0;  Sodium 141  Recent Lipid Panel    Component Value Date/Time   CHOL 193 01/16/2021 1445   TRIG 169 (H) 01/16/2021 1445   HDL 65 01/16/2021 1445   CHOLHDL 3.0 01/16/2021 1445   LDLCALC 99 01/16/2021 1445    PHYSICAL EXAM:    VS:  There were no vitals taken for this visit.  BMI: There is no height or weight on file to calculate BMI.  Affect appropriate Healthy:  appears stated age 16: normal Neck post anterior fusion  JVP normal  bilateral bruits no thyromegaly Lungs clear with no wheezing and good diaphragmatic motion Heart:  S1/S2 SEM murmur, no rub, gallop or click PMI normal left subclavian bruit  Abdomen: benighn, BS positve, no tenderness, no AAA no bruit.  No HSM or HJR Left subclavian bruit  No edema Neuro non-focal Skin warm and dry No  muscular weakness   Wt Readings from Last 3 Encounters:  03/21/21 133 lb (60.3 kg)  03/19/21 133 lb 12.8 oz (60.7 kg)  02/26/21 131 lb (59.4 kg)     ASSESSMENT & PLAN:   1. Dyspnea - continue  PRN lasix for elevated BNP but echo normal EF mild MR and only diastolic relaxation abnormality Previous smoker likely some lung dx   2  Coronary artery disease, mild - cardiac CTA 01/28/21 calcium score 116 CAD RADS 2 25-49% non obstructive dx in proximal LAD and mid circumflex   4. Hyperlipidemia - LDL 99 continue statin   5. Right carotid bruit - plaque no stenosis by duplex 01/29/21    6. Left Subclavian Stenosis: no steal symptoms or arm weakness Take BP in right arm Follow   7. Ortho:  left knee arthritis post injection   8. Neuro:  post anterior cervical fusion f/u [facility]     Disposition: F/u with PA/ me in  6 months    Medication Adjustments/Labs and Tests Ordered: Current medicines are reviewed at length with the patient today.  Concerns regarding medicines are outlined above. Medication changes, Labs and Tests ordered today are summarized above and listed in the Patient Instructions accessible in Encounters.    Signed, Jenkins Rouge, MD  08/30/2021 6:10 PM    [facility] [address], [place], [place]  [address] Phone: [phone]; Fax: [phone]

## 2021-09-02 ENCOUNTER — Ambulatory Visit: Payer: Medicare Other | Admitting: Orthopedic Surgery

## 2021-09-05 DIAGNOSIS — R269 Unspecified abnormalities of gait and mobility: Secondary | ICD-10-CM | POA: Diagnosis not present

## 2021-09-05 DIAGNOSIS — M542 Cervicalgia: Secondary | ICD-10-CM | POA: Diagnosis not present

## 2021-09-06 DIAGNOSIS — G4700 Insomnia, unspecified: Secondary | ICD-10-CM | POA: Diagnosis not present

## 2021-09-06 DIAGNOSIS — M810 Age-related osteoporosis without current pathological fracture: Secondary | ICD-10-CM | POA: Diagnosis not present

## 2021-09-06 DIAGNOSIS — F322 Major depressive disorder, single episode, severe without psychotic features: Secondary | ICD-10-CM | POA: Diagnosis not present

## 2021-09-06 DIAGNOSIS — E7800 Pure hypercholesterolemia, unspecified: Secondary | ICD-10-CM | POA: Diagnosis not present

## 2021-09-06 DIAGNOSIS — K219 Gastro-esophageal reflux disease without esophagitis: Secondary | ICD-10-CM | POA: Diagnosis not present

## 2021-09-06 DIAGNOSIS — I2510 Atherosclerotic heart disease of native coronary artery without angina pectoris: Secondary | ICD-10-CM | POA: Diagnosis not present

## 2021-09-06 DIAGNOSIS — E785 Hyperlipidemia, unspecified: Secondary | ICD-10-CM | POA: Diagnosis not present

## 2021-09-06 DIAGNOSIS — I10 Essential (primary) hypertension: Secondary | ICD-10-CM | POA: Diagnosis not present

## 2021-09-10 ENCOUNTER — Ambulatory Visit (INDEPENDENT_AMBULATORY_CARE_PROVIDER_SITE_OTHER): Payer: Medicare Other | Admitting: Cardiovascular Disease

## 2021-09-10 ENCOUNTER — Encounter: Payer: Self-pay | Admitting: Cardiovascular Disease

## 2021-09-10 VITALS — BP 120/72 | HR 63 | Ht 64.0 in | Wt 128.0 lb

## 2021-09-10 DIAGNOSIS — E785 Hyperlipidemia, unspecified: Secondary | ICD-10-CM | POA: Diagnosis not present

## 2021-09-10 DIAGNOSIS — I771 Stricture of artery: Secondary | ICD-10-CM | POA: Diagnosis not present

## 2021-09-10 DIAGNOSIS — I2510 Atherosclerotic heart disease of native coronary artery without angina pectoris: Secondary | ICD-10-CM

## 2021-09-10 DIAGNOSIS — I5189 Other ill-defined heart diseases: Secondary | ICD-10-CM

## 2021-09-10 MED ORDER — FUROSEMIDE 20 MG PO TABS: 20.0000 mg | ORAL_TABLET | Freq: Every day | ORAL | 3 refills | Status: DC | PRN

## 2021-09-12 DIAGNOSIS — M542 Cervicalgia: Secondary | ICD-10-CM | POA: Diagnosis not present

## 2021-09-12 DIAGNOSIS — R269 Unspecified abnormalities of gait and mobility: Secondary | ICD-10-CM | POA: Diagnosis not present

## 2021-09-16 ENCOUNTER — Ambulatory Visit: Payer: Medicare Other | Admitting: Orthopedic Surgery

## 2021-09-25 DIAGNOSIS — R269 Unspecified abnormalities of gait and mobility: Secondary | ICD-10-CM | POA: Diagnosis not present

## 2021-09-25 DIAGNOSIS — M542 Cervicalgia: Secondary | ICD-10-CM | POA: Diagnosis not present

## 2021-09-30 DIAGNOSIS — M542 Cervicalgia: Secondary | ICD-10-CM | POA: Diagnosis not present

## 2021-09-30 DIAGNOSIS — R269 Unspecified abnormalities of gait and mobility: Secondary | ICD-10-CM | POA: Diagnosis not present

## 2021-10-07 DIAGNOSIS — M542 Cervicalgia: Secondary | ICD-10-CM | POA: Diagnosis not present

## 2021-10-07 DIAGNOSIS — R269 Unspecified abnormalities of gait and mobility: Secondary | ICD-10-CM | POA: Diagnosis not present

## 2021-10-10 DIAGNOSIS — I10 Essential (primary) hypertension: Secondary | ICD-10-CM | POA: Diagnosis not present

## 2021-10-10 DIAGNOSIS — I2510 Atherosclerotic heart disease of native coronary artery without angina pectoris: Secondary | ICD-10-CM | POA: Diagnosis not present

## 2021-10-10 DIAGNOSIS — M810 Age-related osteoporosis without current pathological fracture: Secondary | ICD-10-CM | POA: Diagnosis not present

## 2021-10-10 DIAGNOSIS — F322 Major depressive disorder, single episode, severe without psychotic features: Secondary | ICD-10-CM | POA: Diagnosis not present

## 2021-10-10 DIAGNOSIS — K219 Gastro-esophageal reflux disease without esophagitis: Secondary | ICD-10-CM | POA: Diagnosis not present

## 2021-10-10 DIAGNOSIS — E7800 Pure hypercholesterolemia, unspecified: Secondary | ICD-10-CM | POA: Diagnosis not present

## 2021-10-10 DIAGNOSIS — E785 Hyperlipidemia, unspecified: Secondary | ICD-10-CM | POA: Diagnosis not present

## 2021-10-30 DIAGNOSIS — Z1151 Encounter for screening for human papillomavirus (HPV): Secondary | ICD-10-CM | POA: Diagnosis not present

## 2021-10-30 DIAGNOSIS — Z1231 Encounter for screening mammogram for malignant neoplasm of breast: Secondary | ICD-10-CM | POA: Diagnosis not present

## 2021-10-30 DIAGNOSIS — M8580 Other specified disorders of bone density and structure, unspecified site: Secondary | ICD-10-CM | POA: Diagnosis not present

## 2021-10-30 DIAGNOSIS — Z8262 Family history of osteoporosis: Secondary | ICD-10-CM | POA: Diagnosis not present

## 2021-10-30 DIAGNOSIS — N958 Other specified menopausal and perimenopausal disorders: Secondary | ICD-10-CM | POA: Diagnosis not present

## 2021-10-30 DIAGNOSIS — M816 Localized osteoporosis [Lequesne]: Secondary | ICD-10-CM | POA: Diagnosis not present

## 2021-10-30 DIAGNOSIS — F17210 Nicotine dependence, cigarettes, uncomplicated: Secondary | ICD-10-CM | POA: Diagnosis not present

## 2021-10-30 DIAGNOSIS — Z6822 Body mass index (BMI) 22.0-22.9, adult: Secondary | ICD-10-CM | POA: Diagnosis not present

## 2021-10-30 DIAGNOSIS — Z124 Encounter for screening for malignant neoplasm of cervix: Secondary | ICD-10-CM | POA: Diagnosis not present

## 2021-11-05 DIAGNOSIS — L718 Other rosacea: Secondary | ICD-10-CM | POA: Diagnosis not present

## 2021-11-05 DIAGNOSIS — Z85828 Personal history of other malignant neoplasm of skin: Secondary | ICD-10-CM | POA: Diagnosis not present

## 2021-11-05 DIAGNOSIS — L814 Other melanin hyperpigmentation: Secondary | ICD-10-CM | POA: Diagnosis not present

## 2021-11-05 DIAGNOSIS — L570 Actinic keratosis: Secondary | ICD-10-CM | POA: Diagnosis not present

## 2021-11-05 DIAGNOSIS — Z08 Encounter for follow-up examination after completed treatment for malignant neoplasm: Secondary | ICD-10-CM | POA: Diagnosis not present

## 2021-11-05 DIAGNOSIS — L218 Other seborrheic dermatitis: Secondary | ICD-10-CM | POA: Diagnosis not present

## 2021-11-05 DIAGNOSIS — D235 Other benign neoplasm of skin of trunk: Secondary | ICD-10-CM | POA: Diagnosis not present

## 2021-11-05 DIAGNOSIS — L821 Other seborrheic keratosis: Secondary | ICD-10-CM | POA: Diagnosis not present

## 2021-11-06 DIAGNOSIS — R413 Other amnesia: Secondary | ICD-10-CM | POA: Diagnosis not present

## 2021-11-06 DIAGNOSIS — E785 Hyperlipidemia, unspecified: Secondary | ICD-10-CM | POA: Diagnosis not present

## 2021-11-06 DIAGNOSIS — I2510 Atherosclerotic heart disease of native coronary artery without angina pectoris: Secondary | ICD-10-CM | POA: Diagnosis not present

## 2021-11-06 DIAGNOSIS — E559 Vitamin D deficiency, unspecified: Secondary | ICD-10-CM | POA: Diagnosis not present

## 2021-11-06 DIAGNOSIS — Z0000 Encounter for general adult medical examination without abnormal findings: Secondary | ICD-10-CM | POA: Diagnosis not present

## 2021-11-06 DIAGNOSIS — G4700 Insomnia, unspecified: Secondary | ICD-10-CM | POA: Diagnosis not present

## 2021-12-09 DIAGNOSIS — K219 Gastro-esophageal reflux disease without esophagitis: Secondary | ICD-10-CM | POA: Diagnosis not present

## 2021-12-09 DIAGNOSIS — M810 Age-related osteoporosis without current pathological fracture: Secondary | ICD-10-CM | POA: Diagnosis not present

## 2021-12-09 DIAGNOSIS — G4700 Insomnia, unspecified: Secondary | ICD-10-CM | POA: Diagnosis not present

## 2021-12-09 DIAGNOSIS — E785 Hyperlipidemia, unspecified: Secondary | ICD-10-CM | POA: Diagnosis not present

## 2021-12-09 DIAGNOSIS — I10 Essential (primary) hypertension: Secondary | ICD-10-CM | POA: Diagnosis not present

## 2021-12-09 DIAGNOSIS — F322 Major depressive disorder, single episode, severe without psychotic features: Secondary | ICD-10-CM | POA: Diagnosis not present

## 2021-12-17 DIAGNOSIS — H401131 Primary open-angle glaucoma, bilateral, mild stage: Secondary | ICD-10-CM | POA: Diagnosis not present

## 2021-12-20 IMAGING — DX DG CHEST 1V PORT
1 series · 1 of 1 positions shown · non-contrast
Comparison: chest x-ray 11/15/2018, CT chest 01/14/2019

CLINICAL DATA: Near syncope

EXAM:
PORTABLE CHEST 1 VIEW

[chest]
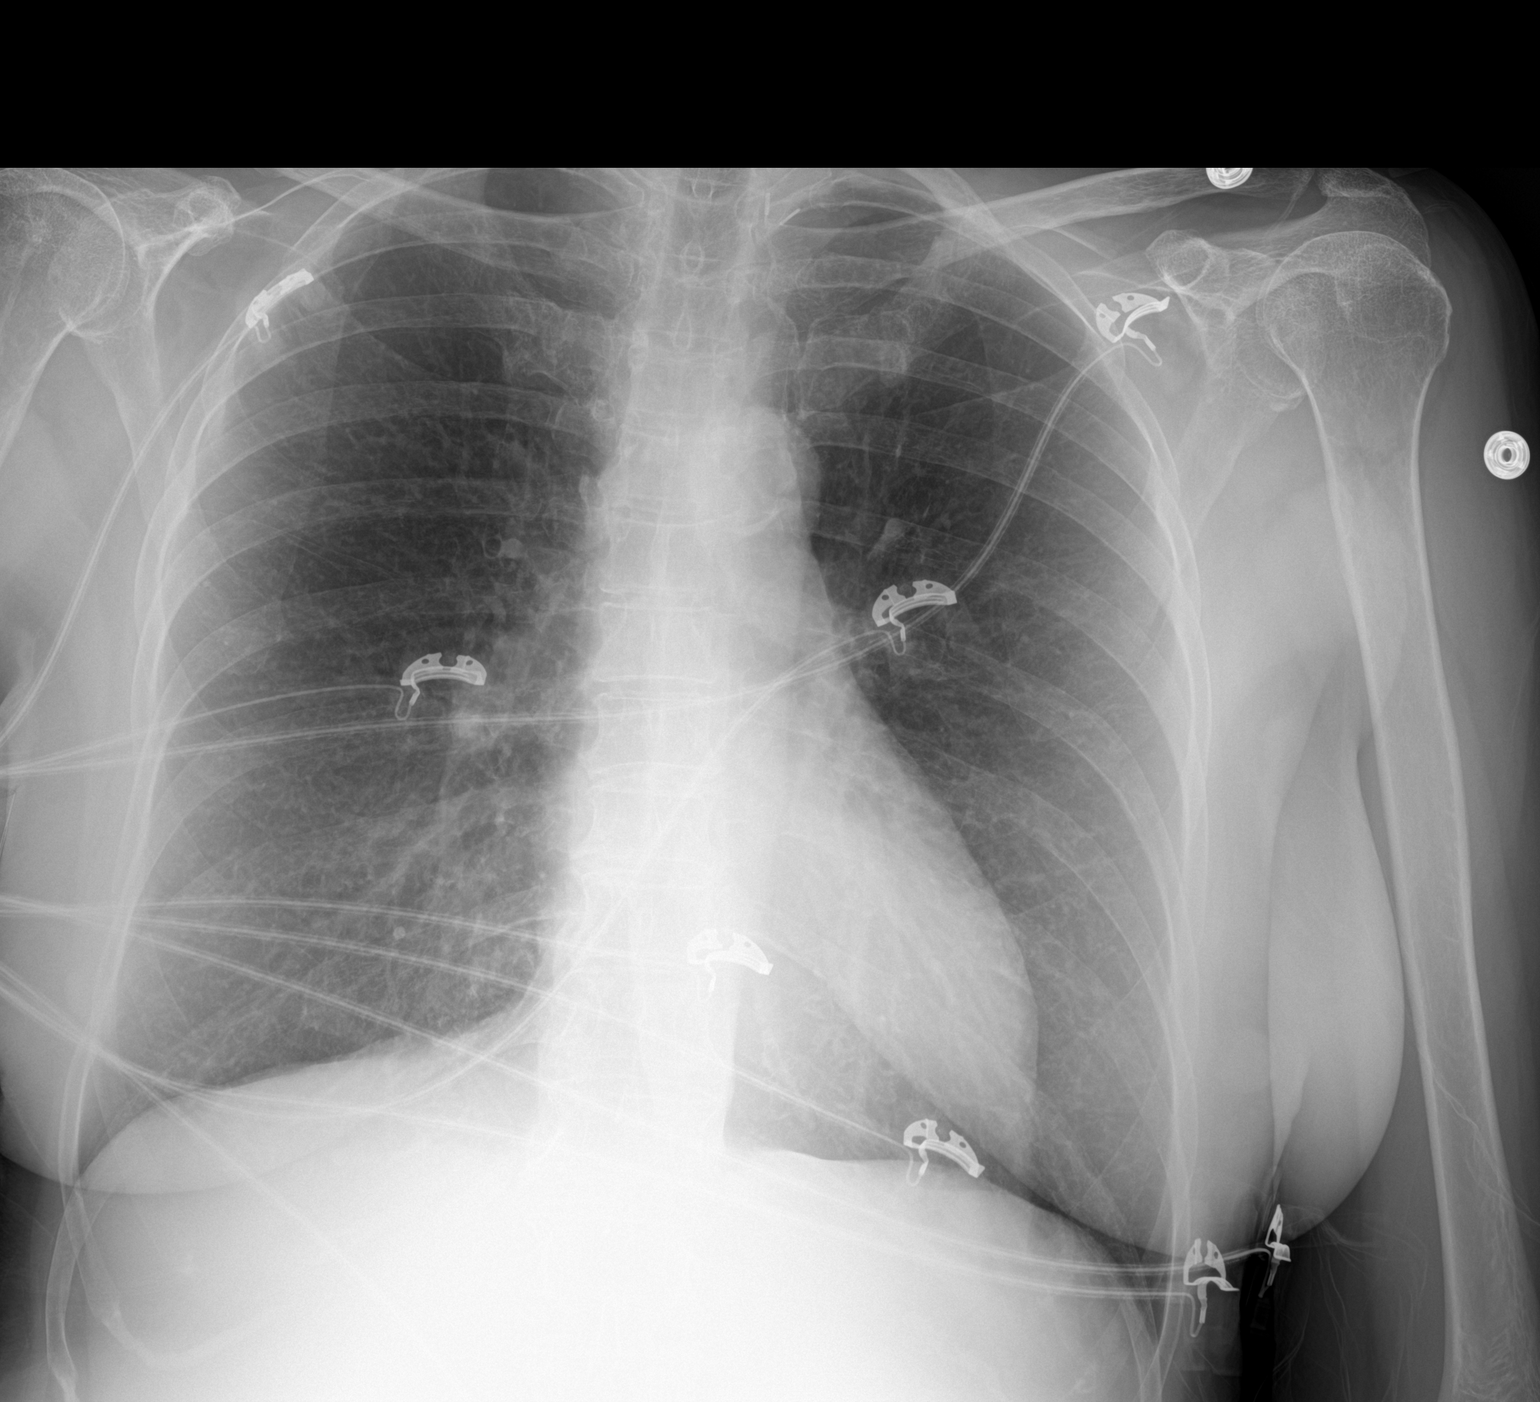

[1 of 1 positions shown; findings below may reference images not displayed]

FINDINGS: The heart size and mediastinal contours are within normal limits.
Aortic arch calcification.

Suggestion of an 8 mm paramediastinal right mid lung zone nodule
that likely represents a vessel en face. No focal consolidation. No
pulmonary edema. No pleural effusion. No pneumothorax.

No acute osseous abnormality.
IMPRESSION: Suggestion of an 8 mm paramediastinal right mid lung zone nodule
that likely represents a vessel en face. Consider repeat PA and
lateral chest x-ray.

## 2021-12-25 DIAGNOSIS — N1831 Chronic kidney disease, stage 3a: Secondary | ICD-10-CM | POA: Diagnosis not present

## 2022-01-01 ENCOUNTER — Encounter: Payer: Self-pay | Admitting: *Deleted

## 2022-01-06 DIAGNOSIS — M810 Age-related osteoporosis without current pathological fracture: Secondary | ICD-10-CM | POA: Diagnosis not present

## 2022-01-06 DIAGNOSIS — E210 Primary hyperparathyroidism: Secondary | ICD-10-CM | POA: Diagnosis not present

## 2022-01-06 DIAGNOSIS — N1831 Chronic kidney disease, stage 3a: Secondary | ICD-10-CM | POA: Diagnosis not present

## 2022-01-06 DIAGNOSIS — E785 Hyperlipidemia, unspecified: Secondary | ICD-10-CM | POA: Diagnosis not present

## 2022-01-06 DIAGNOSIS — I129 Hypertensive chronic kidney disease with stage 1 through stage 4 chronic kidney disease, or unspecified chronic kidney disease: Secondary | ICD-10-CM | POA: Diagnosis not present

## 2022-01-06 DIAGNOSIS — I2510 Atherosclerotic heart disease of native coronary artery without angina pectoris: Secondary | ICD-10-CM | POA: Diagnosis not present

## 2022-01-07 DIAGNOSIS — M4712 Other spondylosis with myelopathy, cervical region: Secondary | ICD-10-CM | POA: Diagnosis not present

## 2022-01-13 ENCOUNTER — Emergency Department (HOSPITAL_COMMUNITY): Payer: Medicare Other

## 2022-01-13 ENCOUNTER — Encounter (HOSPITAL_COMMUNITY): Payer: Self-pay

## 2022-01-13 ENCOUNTER — Emergency Department (HOSPITAL_COMMUNITY)
Admission: EM | Admit: 2022-01-13 | Discharge: 2022-01-14 | Discharge status: Against Medical Advice | Payer: Medicare Other | Attending: Student | Admitting: Student

## 2022-01-13 ENCOUNTER — Other Ambulatory Visit: Payer: Self-pay

## 2022-01-13 DIAGNOSIS — M542 Cervicalgia: Secondary | ICD-10-CM | POA: Insufficient documentation

## 2022-01-13 DIAGNOSIS — G8929 Other chronic pain: Secondary | ICD-10-CM | POA: Diagnosis not present

## 2022-01-13 DIAGNOSIS — T1490XA Injury, unspecified, initial encounter: Secondary | ICD-10-CM | POA: Diagnosis not present

## 2022-01-13 DIAGNOSIS — Z5321 Procedure and treatment not carried out due to patient leaving prior to being seen by health care provider: Secondary | ICD-10-CM | POA: Insufficient documentation

## 2022-01-13 NOTE — ED Triage Notes (Signed)
Bilateral neck pain that radiates down into left back.  No recent trauma.  No thinners.  Neck surgery in jan.  Reports has been lifting and moving things over the last couple months and thinks that is what caused the pain.

## 2022-01-13 NOTE — ED Provider Triage Note (Signed)
Emergency Medicine Provider Triage Evaluation Note  Maureen Chung , a 71 y.o. female  was evaluated in triage.  Pt complains of bilateral neck pain for the past couple of days.  Reports having an cervical fusion in January that went without complications.  Says that she has been moving her family items out of storage unit excessively over the past few weeks and she may have upset her neck that way  Review of Systems  Positive:  Negative:   Physical Exam  BP (!) 160/63 (BP Location: Right Arm)   Pulse 61   Temp 99.3 F (37.4 C) (Oral)   Resp 16   Ht '5\' 4"'$  (1.626 m)   Wt 58.1 kg   SpO2 99%   BMI 21.97 kg/m  Gen:   Awake, no distress   Resp:  Normal effort  MSK:   Moves extremities without difficulty  Other:  Full range of motion of the cervical spine.  No midline tenderness  Medical Decision Making  Medically screening exam initiated at 7:14 PM.  Appropriate orders placed.  JORDON BOURQUIN was informed that the remainder of the evaluation will be completed by another provider, this initial triage assessment does not replace that evaluation, and the importance of remaining in the ED until their evaluation is complete.     Rhae Hammock, [place] 01/13/22 1914

## 2022-01-14 NOTE — ED Notes (Signed)
Pt stated she is leaving and would like to be taking out the system

## 2022-01-16 DIAGNOSIS — G4700 Insomnia, unspecified: Secondary | ICD-10-CM | POA: Diagnosis not present

## 2022-01-16 DIAGNOSIS — F322 Major depressive disorder, single episode, severe without psychotic features: Secondary | ICD-10-CM | POA: Diagnosis not present

## 2022-01-16 DIAGNOSIS — I2510 Atherosclerotic heart disease of native coronary artery without angina pectoris: Secondary | ICD-10-CM | POA: Diagnosis not present

## 2022-01-16 DIAGNOSIS — K219 Gastro-esophageal reflux disease without esophagitis: Secondary | ICD-10-CM | POA: Diagnosis not present

## 2022-01-16 DIAGNOSIS — E7800 Pure hypercholesterolemia, unspecified: Secondary | ICD-10-CM | POA: Diagnosis not present

## 2022-01-16 DIAGNOSIS — M810 Age-related osteoporosis without current pathological fracture: Secondary | ICD-10-CM | POA: Diagnosis not present

## 2022-01-16 DIAGNOSIS — I10 Essential (primary) hypertension: Secondary | ICD-10-CM | POA: Diagnosis not present

## 2022-01-23 DIAGNOSIS — Z23 Encounter for immunization: Secondary | ICD-10-CM | POA: Diagnosis not present

## 2022-02-12 ENCOUNTER — Other Ambulatory Visit: Payer: Self-pay | Admitting: Physician Assistant

## 2022-03-12 DIAGNOSIS — L718 Other rosacea: Secondary | ICD-10-CM | POA: Diagnosis not present

## 2022-03-12 DIAGNOSIS — I788 Other diseases of capillaries: Secondary | ICD-10-CM | POA: Diagnosis not present

## 2022-03-12 DIAGNOSIS — L218 Other seborrheic dermatitis: Secondary | ICD-10-CM | POA: Diagnosis not present

## 2022-04-28 DIAGNOSIS — H401131 Primary open-angle glaucoma, bilateral, mild stage: Secondary | ICD-10-CM | POA: Diagnosis not present

## 2022-05-09 DIAGNOSIS — M810 Age-related osteoporosis without current pathological fracture: Secondary | ICD-10-CM | POA: Diagnosis not present

## 2022-05-09 DIAGNOSIS — J439 Emphysema, unspecified: Secondary | ICD-10-CM | POA: Diagnosis not present

## 2022-05-09 DIAGNOSIS — N1831 Chronic kidney disease, stage 3a: Secondary | ICD-10-CM | POA: Diagnosis not present

## 2022-05-09 DIAGNOSIS — I2510 Atherosclerotic heart disease of native coronary artery without angina pectoris: Secondary | ICD-10-CM | POA: Diagnosis not present

## 2022-05-09 DIAGNOSIS — F322 Major depressive disorder, single episode, severe without psychotic features: Secondary | ICD-10-CM | POA: Diagnosis not present

## 2022-05-09 DIAGNOSIS — Z79899 Other long term (current) drug therapy: Secondary | ICD-10-CM | POA: Diagnosis not present

## 2022-05-20 ENCOUNTER — Other Ambulatory Visit: Payer: Self-pay | Admitting: Physician Assistant

## 2022-06-30 DIAGNOSIS — H401131 Primary open-angle glaucoma, bilateral, mild stage: Secondary | ICD-10-CM | POA: Diagnosis not present

## 2022-07-08 DIAGNOSIS — M4712 Other spondylosis with myelopathy, cervical region: Secondary | ICD-10-CM | POA: Diagnosis not present

## 2022-08-15 DIAGNOSIS — C44529 Squamous cell carcinoma of skin of other part of trunk: Secondary | ICD-10-CM | POA: Diagnosis not present

## 2022-08-15 DIAGNOSIS — L298 Other pruritus: Secondary | ICD-10-CM | POA: Diagnosis not present

## 2022-08-15 DIAGNOSIS — L538 Other specified erythematous conditions: Secondary | ICD-10-CM | POA: Diagnosis not present

## 2022-08-15 DIAGNOSIS — D485 Neoplasm of uncertain behavior of skin: Secondary | ICD-10-CM | POA: Diagnosis not present

## 2022-08-22 ENCOUNTER — Other Ambulatory Visit: Payer: Self-pay | Admitting: Cardiovascular Disease

## 2022-08-27 ENCOUNTER — Ambulatory Visit (INDEPENDENT_AMBULATORY_CARE_PROVIDER_SITE_OTHER): Payer: Medicare Other | Admitting: Orthopedic Surgery

## 2022-08-27 ENCOUNTER — Encounter: Payer: Self-pay | Admitting: Orthopedic Surgery

## 2022-08-27 DIAGNOSIS — M1712 Unilateral primary osteoarthritis, left knee: Secondary | ICD-10-CM

## 2022-08-27 DIAGNOSIS — M1711 Unilateral primary osteoarthritis, right knee: Secondary | ICD-10-CM | POA: Diagnosis not present

## 2022-08-27 MED ORDER — METHYLPREDNISOLONE ACETATE 40 MG/ML IJ SUSP
40.0000 mg | INTRAMUSCULAR | Status: AC | PRN
Start: 2022-08-27 — End: 2022-08-27
  Administered 2022-08-27: 40 mg via INTRA_ARTICULAR

## 2022-08-27 MED ORDER — BUPIVACAINE HCL 0.25 % IJ SOLN
4.0000 mL | INTRAMUSCULAR | Status: AC | PRN
  Administered 2022-08-27: 4 mL via INTRA_ARTICULAR

## 2022-08-27 MED ORDER — BUPIVACAINE HCL 0.25 % IJ SOLN
4.0000 mL | INTRAMUSCULAR | Status: AC | PRN
Start: 2022-08-27 — End: 2022-08-27
  Administered 2022-08-27: 4 mL via INTRA_ARTICULAR

## 2022-08-27 MED ORDER — LIDOCAINE HCL 1 % IJ SOLN
5.0000 mL | INTRAMUSCULAR | Status: AC | PRN
Start: 2022-08-27 — End: 2022-08-27
  Administered 2022-08-27: 5 mL

## 2022-08-27 NOTE — Progress Notes (Signed)
Office Visit Note   Patient: Maureen Chung           Date of Birth: 1950/03/27           MRN: [id number] Visit Date: 08/27/2022 Requested by: Farris Has, MD [address] [place],  [place] [address] PCP: Farris Has, MD  Subjective: Chief Complaint  Patient presents with   Right Knee - Pain   Left Knee - Pain    HPI: Maureen Chung is a 72 y.o. female who presents to the office reporting bilateral knee pain.  She had bilateral cortisone injections done in May of last year which helped her.  Lasted about 6 months.  Since she was last seen she has had some neck surgery which was a 3 level fusion.  She does report decrease standing and walking endurance but not much in the way of mechanical symptoms.  Takes over-the-counter medication for her pain..                ROS: All systems reviewed are negative as they relate to the chief complaint within the history of present illness.  Patient denies fevers or chills.  Assessment & Plan: Visit Diagnoses:  1. Unilateral primary osteoarthritis, left knee   2. Unilateral primary osteoarthritis, right knee     Plan: Impression is bilateral knee arthritis with fairly well-maintained range of motion and only trace effusion in the right knee.  Bilateral knee cortisone injections performed today.  Continue with nonweightbearing quad strengthening exercises and follow-up as needed.  Follow-Up Instructions: No follow-ups on file.   Orders:  No orders of the defined types were placed in this encounter.  No orders of the defined types were placed in this encounter.     Procedures: Large Joint Inj: bilateral knee on 08/27/2022 2:15 PM Indications: diagnostic evaluation, joint swelling and pain Details: 18 G 1.5 in needle, superolateral approach  Arthrogram: No  Medications (Right): 5 mL lidocaine 1 %; 4 mL bupivacaine 0.25 %; 40 mg methylPREDNISolone acetate 40 MG/ML Medications (Left): 5 mL lidocaine 1 %; 4 mL  bupivacaine 0.25 %; 40 mg methylPREDNISolone acetate 40 MG/ML Outcome: tolerated well, no immediate complications Procedure, treatment alternatives, risks and benefits explained, specific risks discussed. Consent was given by the patient. Immediately prior to procedure a time out was called to verify the correct patient, procedure, equipment, support staff and site/side marked as required. Patient was prepped and draped in the usual sterile fashion.       Clinical Data: No additional findings.  Objective: Vital Signs: There were no vitals taken for this visit.  Physical Exam:  Constitutional: Patient appears well-developed HEENT:  Head: Normocephalic Eyes:EOM are normal Neck: Normal range of motion Cardiovascular: Normal rate Pulmonary/chest: Effort normal Neurologic: Patient is alert Skin: Skin is warm Psychiatric: Patient has normal mood and affect  Ortho Exam: Ortho exam demonstrates full active and passive range of motion of the hips and ankles.  Pedal pulses palpable.  Trace effusion right knee no effusion left knee.  Extensor mechanism intact.  Patient has generally full range of motion with significant patellofemoral crepitus.  Specialty Comments:  No specialty comments available.  Imaging: No results found.   PMFS History: Patient Active Problem List   Diagnosis Date Noted   Cervical spondylosis with myelopathy and radiculopathy 03/21/2021   Abnormal findings on diagnostic imaging of lung 12/13/2018   Healthcare maintenance 12/13/2018   Former smoker 11/16/2018   COPD GOLD 0  11/15/2018   Chest  pain 10/18/2015   Hyperparathyroidism, primary (HCC) 11/27/2010   Past Medical History:  Diagnosis Date   Anemia    Anxiety    Arthritis    Chronic kidney disease, stage 3 (HCC)    Depression    Diastolic dysfunction    Heart murmur    History of kidney stones    Hypercalcemia    Hyperlipidemia    Hyperparathyroidism    Hypertension    Insomnia    Mild CAD     Osteoporosis    Pericarditis     Family History  Problem Relation Age of Onset   Heart disease Father    Diabetes Father    Cancer Mother        ovarian   Coronary artery disease Brother    Bladder Cancer Brother    Heart disease Brother     Past Surgical History:  Procedure Laterality Date   ANTERIOR CERVICAL DECOMP/DISCECTOMY FUSION N/A 03/21/2021   Procedure: ACDF, IP, PLATE/SCREWS Z61, C56, C67;  Surgeon: Tressie Stalker, MD;  Location: [facility] OR;  Service: Neurosurgery;  Laterality: N/A;   BREAST CYST EXCISION  03/18/1995   benign   CARDIAC CATHETERIZATION N/A 10/18/2015   Procedure: Left Heart Cath and Coronary Angiography;  Surgeon: Wendall Stade, MD;  Location: [facility] INVASIVE CV LAB;  Service: Cardiovascular;  Laterality: N/A;   child birth  [birth year]/17/1988   EYE SURGERY     LAPAROSCOPIC ENDOMETRIOSIS FULGURATION  1989, 1995   PARATHYROIDECTOMY  11/01/2010   TONSILECTOMY, ADENOIDECTOMY, BILATERAL MYRINGOTOMY AND TUBES  03/18/1967   TONSILLECTOMY     VAGINAL HYSTERECTOMY  03/18/1979   without ovaries   Social History   Occupational History   Not on file  Tobacco Use   Smoking status: Former    Packs/day: 1.00    Years: 41.00    Additional pack years: 0.00    Total pack years: 41.00    Types: Cigarettes    Quit date: 11/12/2019    Years since quitting: 2.7   Smokeless tobacco: Never   Tobacco comments:    Encouraged to develop a plan or come to me for assistance when ready to set a date.  Vaping Use   Vaping Use: Never used  Substance and Sexual Activity   Alcohol use: Yes    Alcohol/week: 0.0 standard drinks of alcohol    Comment: once per day   Drug use: No   Sexual activity: Not on file

## 2022-08-28 DIAGNOSIS — H401131 Primary open-angle glaucoma, bilateral, mild stage: Secondary | ICD-10-CM | POA: Diagnosis not present

## 2022-09-18 IMAGING — MR MR CERVICAL SPINE W/O CM
4 of 5 series · 27 of 48 positions shown · non-contrast
Comparison: MRI from 09/02/2019.

CLINICAL DATA: Initial evaluation for pain and weakness and neck as
well as the bilateral shoulders and arms, decreased range of motion.

EXAM:
MRI CERVICAL SPINE WITHOUT CONTRAST
TECHNIQUE: Multiplanar, multisequence MR imaging of the cervical spine was
performed. No intravenous contrast was administered.

[Series 3: T2 · sagittal · 3.0mm · 0.66mm/px · 7 of 18 slices shown (1 of 2)]
[im 1/18]
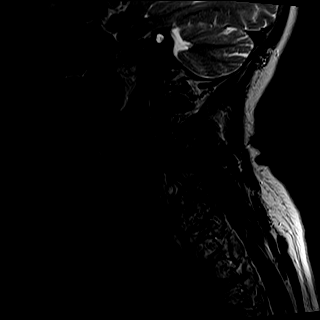
[im 3/18]
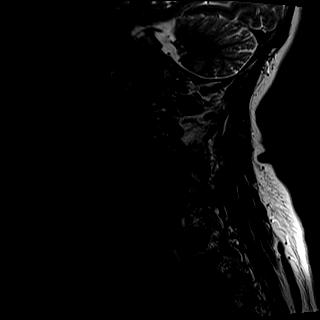
[im 6/18]
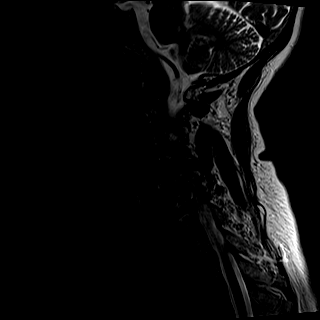
[im 9/18]
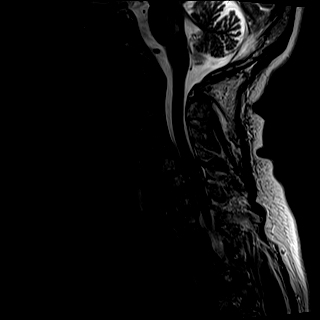
[im 12/18]
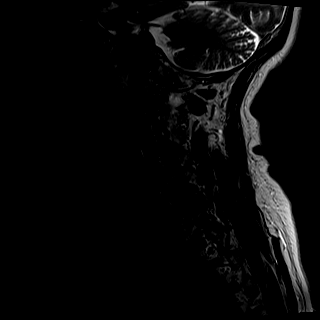
[im 15/18]
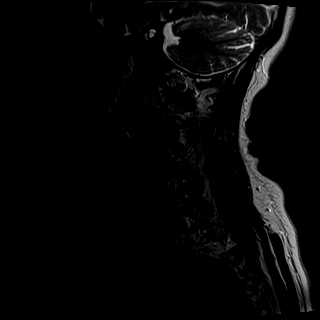
[im 18/18]
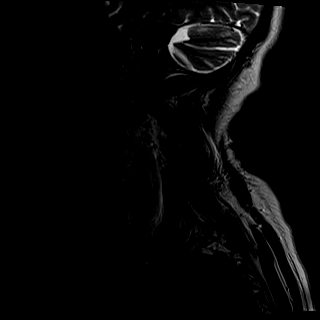

[Series 4: T1 · sagittal · 3.0mm · 0.41mm/px · 7 of 18 slices shown]
[im 1/18]
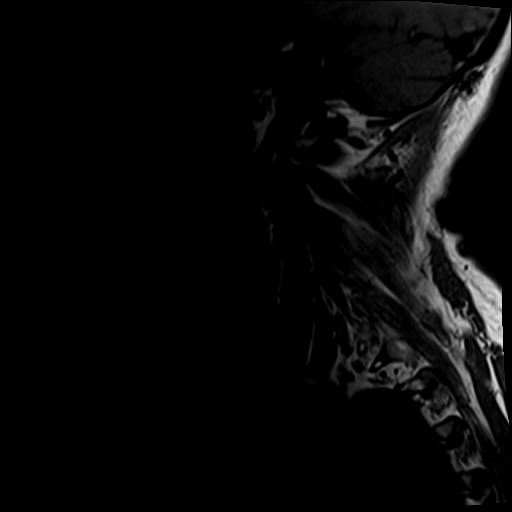
[im 3/18]
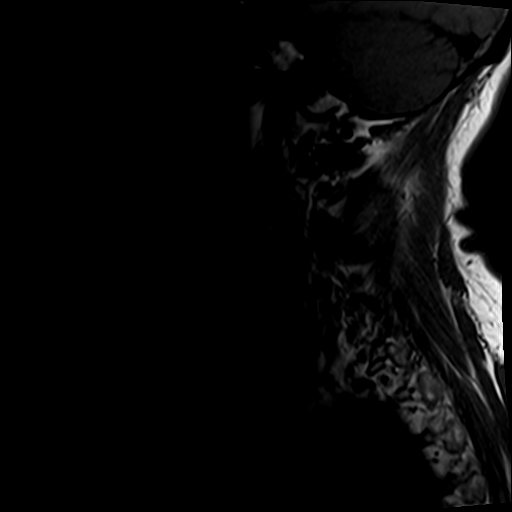
[im 6/18]
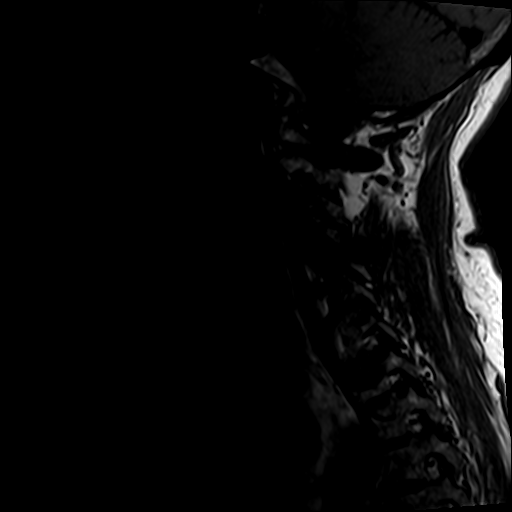
[im 9/18]
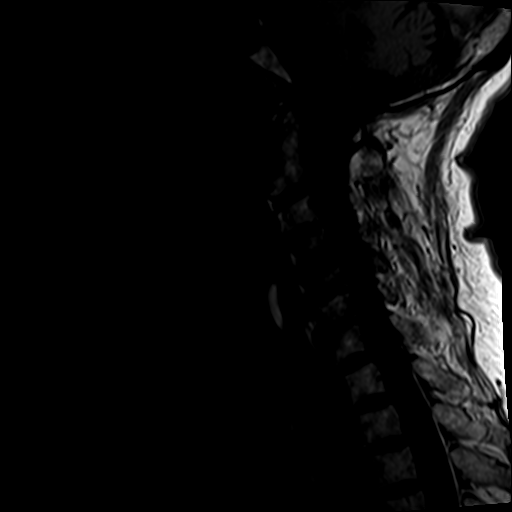
[im 12/18]
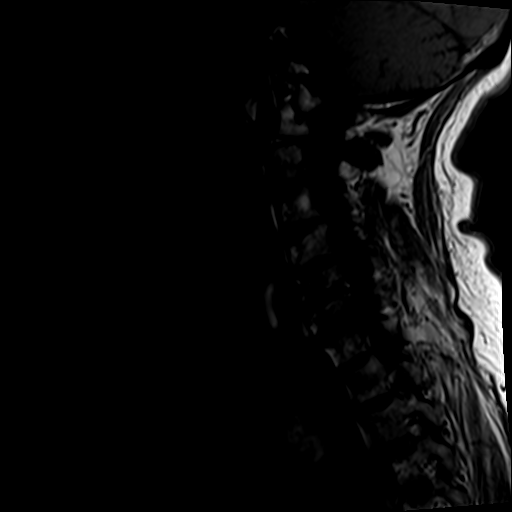
[im 15/18]
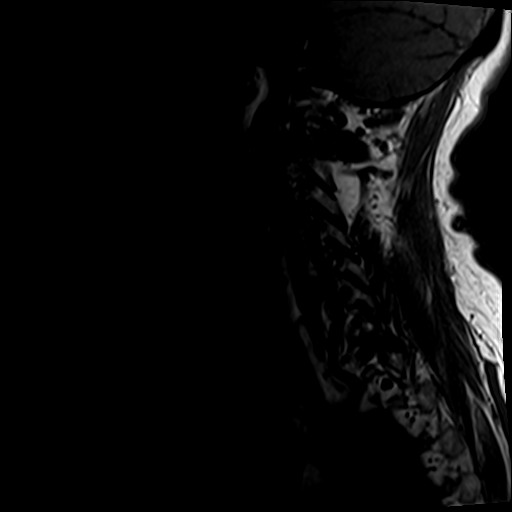
[im 18/18]
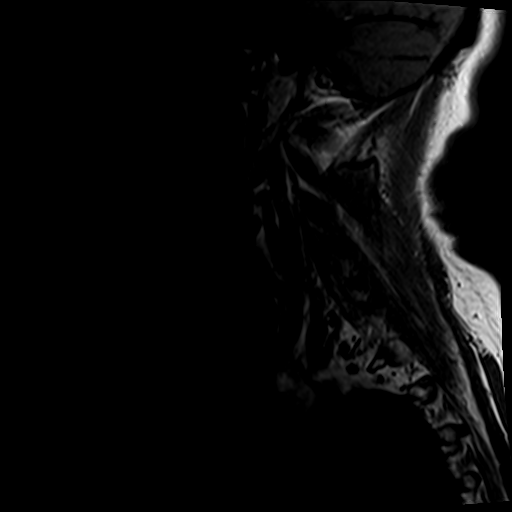

[Series 5: tir sag · sagittal · 3.0mm · 0.41mm/px · 4 of 18 slices shown]
[im 1/18]
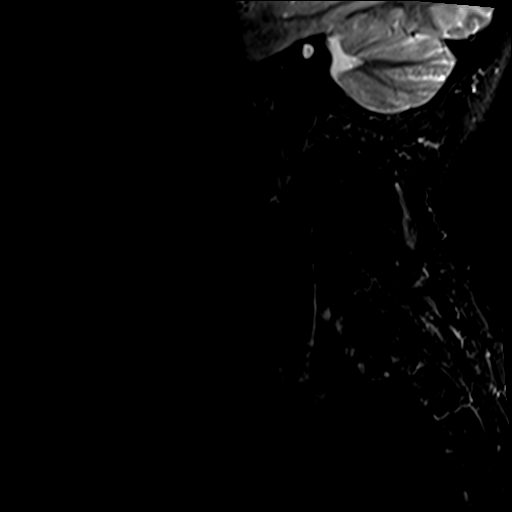
[im 3/18]
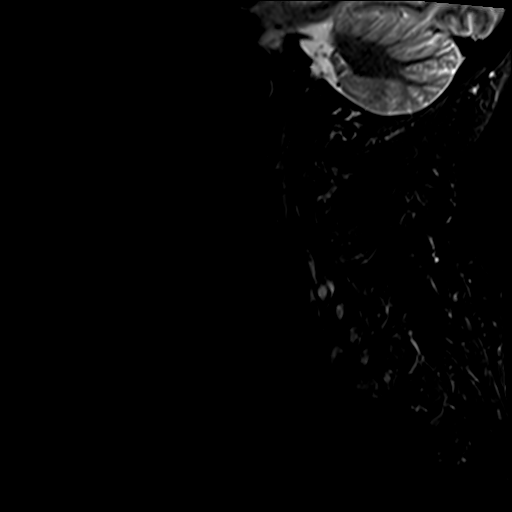
[im 10/18]
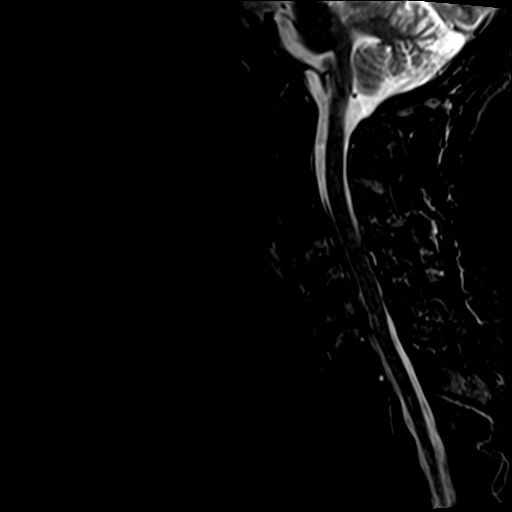
[im 15/18]
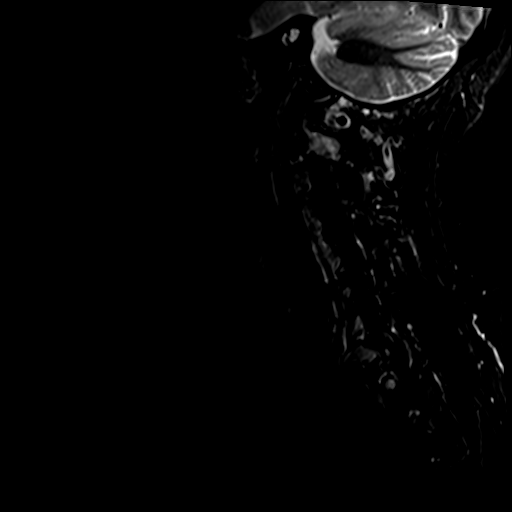

[Series 7: T2 · axial · 3.0mm · 0.70mm/px · z∈[-98,+8]mm · 9 of 30 slices shown (2 of 2)]
[im 1/30]
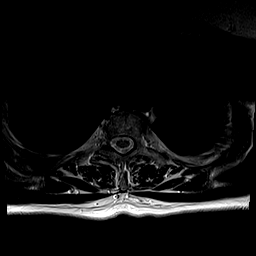
[im 5/30]
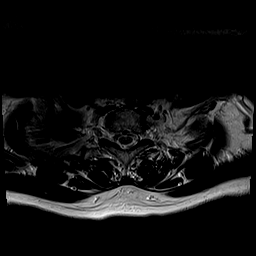
[im 10/30]
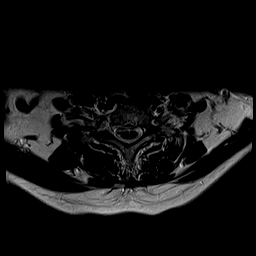
[im 13/30]
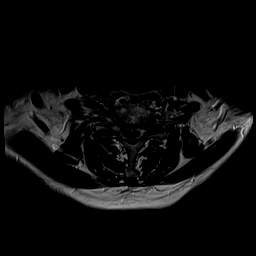
[im 15/30]
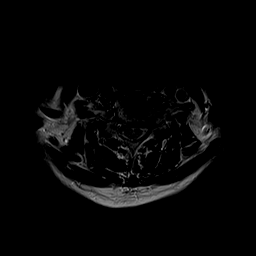
[im 17/30]
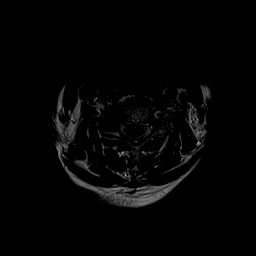
[im 20/30]
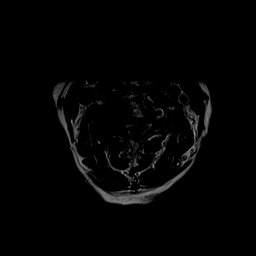
[im 25/30]
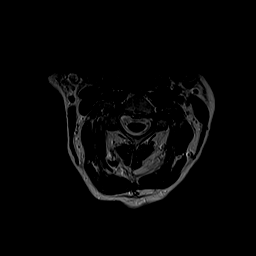
[im 30/30]
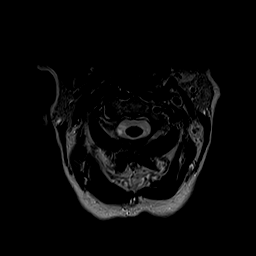

[27 of 48 positions shown; findings below may reference images not displayed]

FINDINGS: Alignment: Straightening of the normal cervical lordosis with
underlying levoscoliosis. Trace anterolisthesis of C3 on C4, C4 on
C5, and C7 on T1. Findings chronic and facet mediated.

Vertebrae: Vertebral body height maintained without acute or chronic
fracture. Bone marrow signal intensity heterogeneous but overall
within normal limits. No worrisome osseous lesions. Prominent
discogenic reactive endplate change present about the C4-5 through
C6-7 interspaces with associated marrow edema. Additional reactive
marrow edema present about the right C4-5 facet due to facet
arthritis.

Cord: Normal signal morphology.

Posterior Fossa, vertebral arteries, paraspinal tissues: Patchy
signal abnormality within the visualized pons, most characteristic
of chronic small vessel ischemic disease. Visualized brain and
posterior fossa otherwise unremarkable. Craniocervical junction
within normal limits. Paraspinous soft tissues normal. Normal flow
voids seen within the vertebral arteries bilaterally.

Disc levels:

C2-C3: Negative interspace. Moderate right with mild left facet
hypertrophy. No spinal stenosis. Mild right C3 foraminal narrowing.
Appearance is relatively stable.

C3-C4: Disc bulge with bilateral uncovertebral spurring. Flattening
and partial effacement of the ventral thecal sac. Right worse than
left facet arthrosis. No significant spinal stenosis. Moderate
bilateral C4 foraminal narrowing. Appearance is relatively similar.

C4-C5: Anterolisthesis. Diffuse disc bulge, asymmetric to the right.
Associated right-sided uncovertebral hypertrophy with exuberant
right-sided facet arthrosis. Posterior disc osteophyte flattens and
partially effaces the ventral thecal sac with resultant mild spinal
stenosis. Severe right with moderate left C5 foraminal narrowing.
Appearance is mildly progressed from prior.

C5-C6: A moderate degenerative intervertebral disc space narrowing
with diffuse disc osteophyte complex. Flattening and partial
effacement of the ventral thecal sac with resultant mild spinal
stenosis. Severe left with moderate right C6 foraminal stenosis.
Appearance is mildly progressed

C6-C7: Moderate degenerative intervertebral disc space narrowing
with diffuse disc osteophyte complex. Flattening and partial
effacement of the ventral thecal sac with resultant mild spinal
stenosis. Severe left with mild right C7 foraminal narrowing.
Appearance is mildly progressed from prior. From prior.

C7-T1: Negative interspace. Left worse than right facet hypertrophy.
No spinal stenosis. Mild left C8 foraminal narrowing. Right neural
foramina remains patent. Appearance is similar.

T1-2: Right paracentral disc extrusion with superior migration
(series 7, image 27). Minimal flattening of the right ventral cord
without cord signal changes or significant stenosis. Foramina remain
patent.
IMPRESSION: 1. Multilevel cervical spondylosis with resultant mild spinal
stenosis at C4-5 through C6-7. Overall, appearance is mildly
progressed as compared to 1715.
2. Multifactorial degenerative changes with resultant multilevel
foraminal narrowing as above. Notable findings include moderate
bilateral C4 foraminal stenosis, severe right with moderate left C5
foraminal narrowing, severe left with moderate right C6 foraminal
stenosis, with severe left C7 foraminal narrowing.
3. Right paracentral disc extrusion at T1-2 without significant
stenosis.

## 2022-09-22 ENCOUNTER — Telehealth: Payer: Self-pay | Admitting: Cardiovascular Disease

## 2022-09-22 MED ORDER — ATORVASTATIN CALCIUM 40 MG PO TABS: 40.0000 mg | ORAL_TABLET | Freq: Every day | ORAL | 1 refills | Status: DC

## 2022-09-22 NOTE — Telephone Encounter (Signed)
 *  STAT* If patient is at the pharmacy, call can be transferred to refill team.   1. Which medications need to be refilled? (please list name of each medication and dose if known)   atorvastatin (LIPITOR) 40 MG tablet   2. Which pharmacy/location (including street and city if local pharmacy) is medication to be sent to?  [facility] [place], [place] - [address]    3. Do they need a 30 day or 90 day supply?  90 days

## 2022-09-22 NOTE — Telephone Encounter (Signed)
Pt's medication was sent to pt's pharmacy as requested. Confirmation received.  °

## 2022-10-24 DIAGNOSIS — R112 Nausea with vomiting, unspecified: Secondary | ICD-10-CM | POA: Diagnosis not present

## 2022-10-24 DIAGNOSIS — U071 COVID-19: Secondary | ICD-10-CM | POA: Diagnosis not present

## 2022-10-28 IMAGING — CR DG CERVICAL SPINE 2 OR 3 VIEWS
2 series · 2 of 2 positions shown · non-contrast
Comparison: None.

CLINICAL DATA: Radiograph done for intraoperative localization

EXAM:
CERVICAL SPINE - 2-3 VIEW

[AP (1 of 2)]
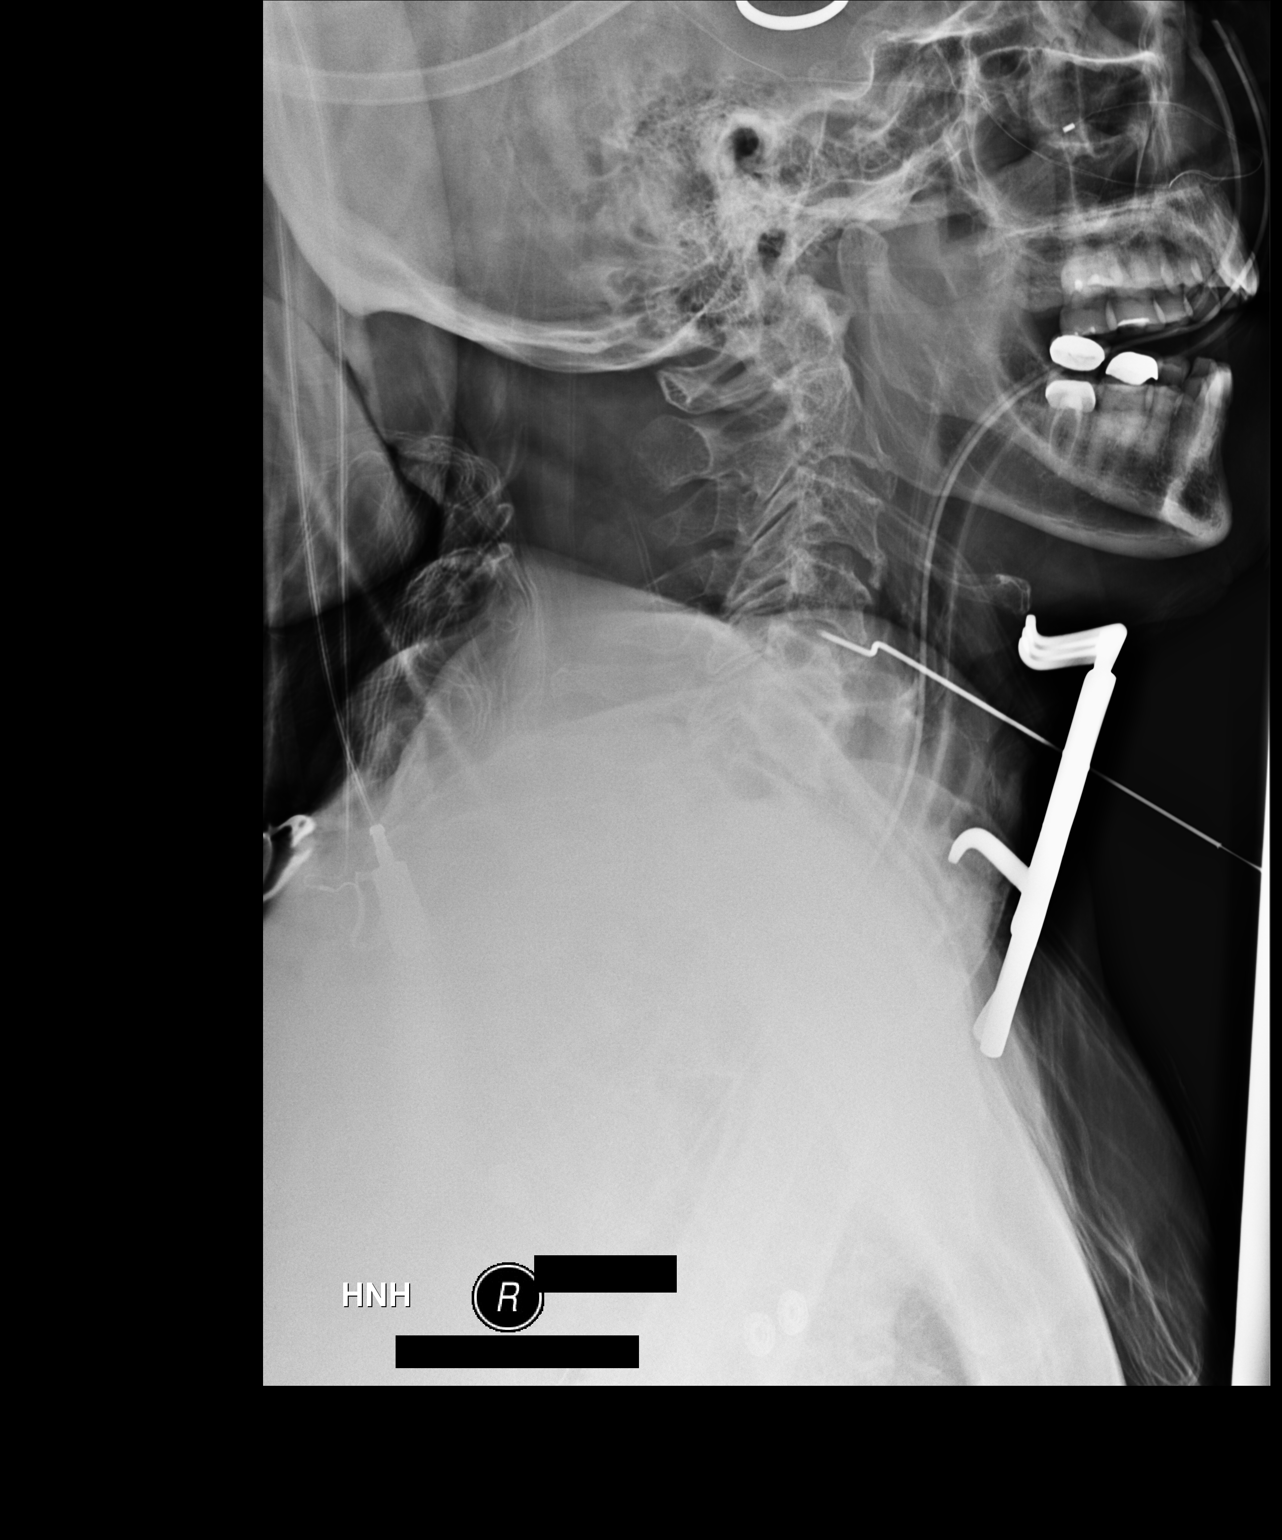

[AP (2 of 2)]
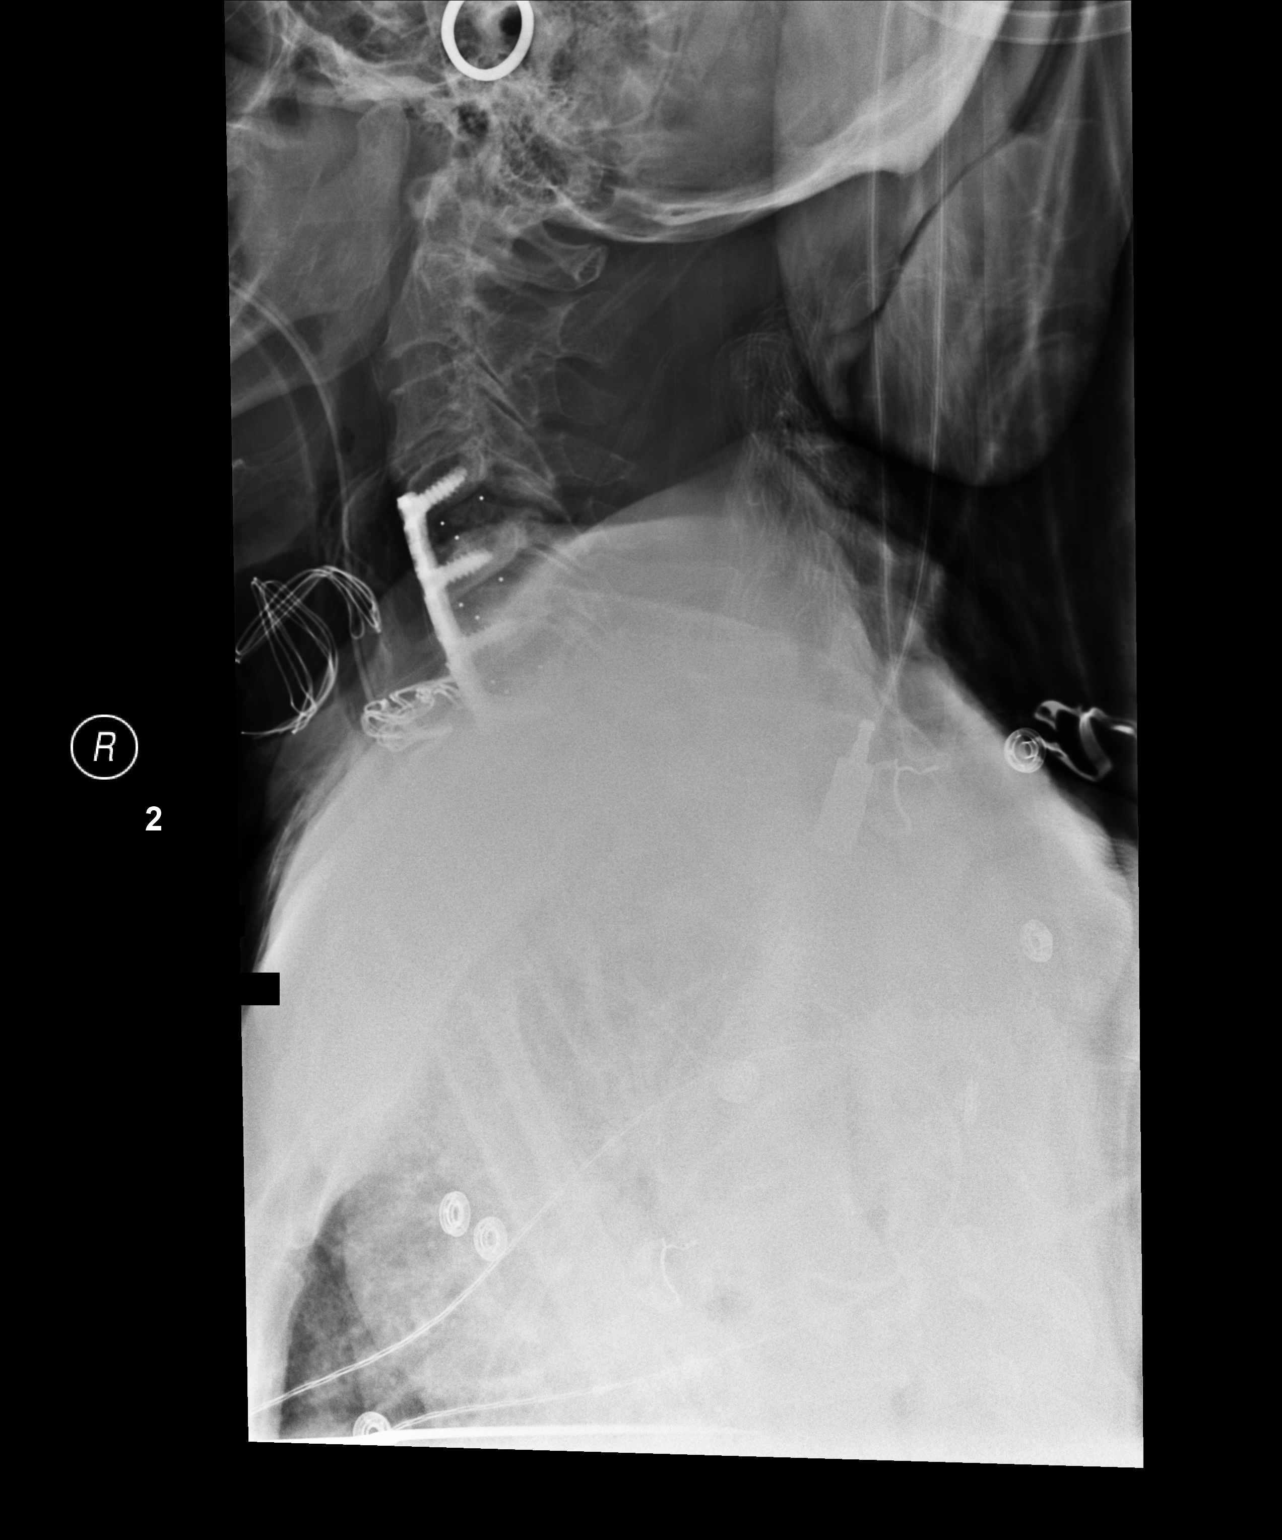

[2 of 2 positions shown; findings below may reference images not displayed]

FINDINGS: Cross-table lateral view of cervical spine shows tip of localizing
probe in the C4-C5 disc space. There is anterior surgical fusion
from C4-C7 levels.
IMPRESSION: Status post anterior cervical spinal fusion.

## 2022-10-31 DIAGNOSIS — R5383 Other fatigue: Secondary | ICD-10-CM | POA: Diagnosis not present

## 2022-10-31 DIAGNOSIS — U071 COVID-19: Secondary | ICD-10-CM | POA: Diagnosis not present

## 2022-11-06 DIAGNOSIS — R5383 Other fatigue: Secondary | ICD-10-CM | POA: Diagnosis not present

## 2022-11-06 DIAGNOSIS — R7309 Other abnormal glucose: Secondary | ICD-10-CM | POA: Diagnosis not present

## 2022-11-11 DIAGNOSIS — D225 Melanocytic nevi of trunk: Secondary | ICD-10-CM | POA: Diagnosis not present

## 2022-11-11 DIAGNOSIS — L814 Other melanin hyperpigmentation: Secondary | ICD-10-CM | POA: Diagnosis not present

## 2022-11-11 DIAGNOSIS — L821 Other seborrheic keratosis: Secondary | ICD-10-CM | POA: Diagnosis not present

## 2022-11-11 DIAGNOSIS — L570 Actinic keratosis: Secondary | ICD-10-CM | POA: Diagnosis not present

## 2022-11-11 DIAGNOSIS — D485 Neoplasm of uncertain behavior of skin: Secondary | ICD-10-CM | POA: Diagnosis not present

## 2022-11-11 DIAGNOSIS — L718 Other rosacea: Secondary | ICD-10-CM | POA: Diagnosis not present

## 2022-11-11 DIAGNOSIS — L723 Sebaceous cyst: Secondary | ICD-10-CM | POA: Diagnosis not present

## 2022-12-03 DIAGNOSIS — J439 Emphysema, unspecified: Secondary | ICD-10-CM | POA: Diagnosis not present

## 2022-12-03 DIAGNOSIS — I700 Atherosclerosis of aorta: Secondary | ICD-10-CM | POA: Diagnosis not present

## 2022-12-03 DIAGNOSIS — I2510 Atherosclerotic heart disease of native coronary artery without angina pectoris: Secondary | ICD-10-CM | POA: Diagnosis not present

## 2022-12-03 DIAGNOSIS — Z0000 Encounter for general adult medical examination without abnormal findings: Secondary | ICD-10-CM | POA: Diagnosis not present

## 2022-12-03 DIAGNOSIS — E559 Vitamin D deficiency, unspecified: Secondary | ICD-10-CM | POA: Diagnosis not present

## 2022-12-03 DIAGNOSIS — R296 Repeated falls: Secondary | ICD-10-CM | POA: Diagnosis not present

## 2022-12-03 DIAGNOSIS — Z23 Encounter for immunization: Secondary | ICD-10-CM | POA: Diagnosis not present

## 2022-12-03 DIAGNOSIS — E785 Hyperlipidemia, unspecified: Secondary | ICD-10-CM | POA: Diagnosis not present

## 2022-12-03 DIAGNOSIS — Z87891 Personal history of nicotine dependence: Secondary | ICD-10-CM | POA: Diagnosis not present

## 2022-12-03 DIAGNOSIS — F321 Major depressive disorder, single episode, moderate: Secondary | ICD-10-CM | POA: Diagnosis not present

## 2022-12-08 DIAGNOSIS — H35363 Drusen (degenerative) of macula, bilateral: Secondary | ICD-10-CM | POA: Diagnosis not present

## 2022-12-10 ENCOUNTER — Other Ambulatory Visit: Payer: Self-pay | Admitting: Family Medicine

## 2022-12-10 DIAGNOSIS — Z87891 Personal history of nicotine dependence: Secondary | ICD-10-CM

## 2022-12-16 DIAGNOSIS — H353211 Exudative age-related macular degeneration, right eye, with active choroidal neovascularization: Secondary | ICD-10-CM | POA: Diagnosis not present

## 2022-12-16 DIAGNOSIS — H401132 Primary open-angle glaucoma, bilateral, moderate stage: Secondary | ICD-10-CM | POA: Diagnosis not present

## 2022-12-16 DIAGNOSIS — H353122 Nonexudative age-related macular degeneration, left eye, intermediate dry stage: Secondary | ICD-10-CM | POA: Diagnosis not present

## 2022-12-16 DIAGNOSIS — H43811 Vitreous degeneration, right eye: Secondary | ICD-10-CM | POA: Diagnosis not present

## 2022-12-31 DIAGNOSIS — H353211 Exudative age-related macular degeneration, right eye, with active choroidal neovascularization: Secondary | ICD-10-CM | POA: Diagnosis not present

## 2023-01-05 DIAGNOSIS — N1831 Chronic kidney disease, stage 3a: Secondary | ICD-10-CM | POA: Diagnosis not present

## 2023-01-08 DIAGNOSIS — N1831 Chronic kidney disease, stage 3a: Secondary | ICD-10-CM | POA: Diagnosis not present

## 2023-01-08 DIAGNOSIS — I2510 Atherosclerotic heart disease of native coronary artery without angina pectoris: Secondary | ICD-10-CM | POA: Diagnosis not present

## 2023-01-08 DIAGNOSIS — I129 Hypertensive chronic kidney disease with stage 1 through stage 4 chronic kidney disease, or unspecified chronic kidney disease: Secondary | ICD-10-CM | POA: Diagnosis not present

## 2023-01-08 DIAGNOSIS — E785 Hyperlipidemia, unspecified: Secondary | ICD-10-CM | POA: Diagnosis not present

## 2023-01-08 DIAGNOSIS — E210 Primary hyperparathyroidism: Secondary | ICD-10-CM | POA: Diagnosis not present

## 2023-01-24 DIAGNOSIS — Z23 Encounter for immunization: Secondary | ICD-10-CM | POA: Diagnosis not present

## 2023-01-28 DIAGNOSIS — H43813 Vitreous degeneration, bilateral: Secondary | ICD-10-CM | POA: Diagnosis not present

## 2023-01-28 DIAGNOSIS — H353211 Exudative age-related macular degeneration, right eye, with active choroidal neovascularization: Secondary | ICD-10-CM | POA: Diagnosis not present

## 2023-01-28 DIAGNOSIS — H353122 Nonexudative age-related macular degeneration, left eye, intermediate dry stage: Secondary | ICD-10-CM | POA: Diagnosis not present

## 2023-01-28 DIAGNOSIS — H401132 Primary open-angle glaucoma, bilateral, moderate stage: Secondary | ICD-10-CM | POA: Diagnosis not present
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
## 2023-02-23 ENCOUNTER — Ambulatory Visit: Payer: Medicare Other | Attending: Cardiovascular Disease | Admitting: Cardiovascular Disease

## 2023-02-23 ENCOUNTER — Other Ambulatory Visit: Payer: Self-pay

## 2023-02-23 ENCOUNTER — Encounter: Payer: Self-pay | Admitting: Cardiovascular Disease

## 2023-02-23 VITALS — BP 154/72 | HR 64 | Ht 64.0 in | Wt 133.0 lb

## 2023-02-23 DIAGNOSIS — I2510 Atherosclerotic heart disease of native coronary artery without angina pectoris: Secondary | ICD-10-CM | POA: Diagnosis not present

## 2023-02-23 DIAGNOSIS — R0600 Dyspnea, unspecified: Secondary | ICD-10-CM | POA: Diagnosis not present

## 2023-02-23 DIAGNOSIS — I771 Stricture of artery: Secondary | ICD-10-CM | POA: Diagnosis not present

## 2023-02-23 MED ORDER — FUROSEMIDE 20 MG PO TABS: 20.0000 mg | ORAL_TABLET | Freq: Every day | ORAL | 0 refills | Status: DC | PRN

## 2023-02-23 MED ORDER — NITROGLYCERIN 0.4 MG SL SUBL: 0.4000 mg | SUBLINGUAL_TABLET | SUBLINGUAL | 3 refills | Status: AC | PRN

## 2023-02-23 MED ORDER — METOPROLOL SUCCINATE ER 25 MG PO TB24: 25.0000 mg | ORAL_TABLET | Freq: Every day | ORAL | 3 refills | Status: AC

## 2023-02-23 MED ORDER — ATORVASTATIN CALCIUM 40 MG PO TABS: 40.0000 mg | ORAL_TABLET | Freq: Every day | ORAL | 3 refills | Status: DC

## 2023-02-23 NOTE — Patient Instructions (Addendum)
Medication Instructions:  Your physician recommends that you continue on your current medications as directed. Please refer to the Current Medication list given to you today.  *If you need a refill on your cardiac medications before your next appointment, please call your pharmacy*  Lab Work: If you have labs (blood work) drawn today and your tests are completely normal, you will receive your results only by: MyChart Message (if you have MyChart) OR A paper copy in the mail If you have any lab test that is abnormal or we need to change your treatment, we will call you to review the results.  Testing/Procedures: Your physician has requested that you have an echocardiogram. Echocardiography is a painless test that uses sound waves to create images of your heart. It provides your doctor with information about the size and shape of your heart and how well your heart's chambers and valves are working. This procedure takes approximately one hour. There are no restrictions for this procedure. Please do NOT wear cologne, perfume, aftershave, or lotions (deodorant is allowed). Please arrive 15 minutes prior to your appointment time.  Please note: We ask at that you not bring children with you during ultrasound (echo/ vascular) testing. Due to room size and safety concerns, children are not allowed in the ultrasound rooms during exams. Our front office staff cannot provide observation of children in our lobby area while testing is being conducted. An adult accompanying a patient to their appointment will only be allowed in the ultrasound room at the discretion of the ultrasound technician under special circumstances. We apologize for any inconvenience. Follow-Up: At [facility], you and your health needs are our priority.  As part of our continuing mission to provide you with exceptional heart care, we have created designated Provider Care Teams.  These Care Teams include your primary Cardiologist  (physician) and Advanced Practice Providers (APPs -  Physician Assistants and Nurse Practitioners) who all work together to provide you with the care you need, when you need it.  We recommend signing up for the patient portal called "MyChart".  Sign up information is provided on this After Visit Summary.  MyChart is used to connect with patients for Virtual Visits (Telemedicine).  Patients are able to view lab/test results, encounter notes, upcoming appointments, etc.  Non-urgent messages can be sent to your provider as well.   To learn more about what you can do with MyChart, go to [email or web address].    Your next appointment:   1 year(s)  Provider:   Charlton Haws, MD     Please send [place] a message through Mychart of the medication you are on.

## 2023-02-25 DIAGNOSIS — H353122 Nonexudative age-related macular degeneration, left eye, intermediate dry stage: Secondary | ICD-10-CM | POA: Diagnosis not present

## 2023-02-25 DIAGNOSIS — H353211 Exudative age-related macular degeneration, right eye, with active choroidal neovascularization: Secondary | ICD-10-CM | POA: Diagnosis not present

## 2023-02-25 DIAGNOSIS — H401132 Primary open-angle glaucoma, bilateral, moderate stage: Secondary | ICD-10-CM | POA: Diagnosis not present

## 2023-02-25 DIAGNOSIS — H43813 Vitreous degeneration, bilateral: Secondary | ICD-10-CM | POA: Diagnosis not present

## 2023-03-04 DIAGNOSIS — H401131 Primary open-angle glaucoma, bilateral, mild stage: Secondary | ICD-10-CM | POA: Diagnosis not present

## 2023-03-25 ENCOUNTER — Ambulatory Visit (HOSPITAL_COMMUNITY): Payer: Medicare Other | Attending: Cardiology

## 2023-03-25 DIAGNOSIS — I2510 Atherosclerotic heart disease of native coronary artery without angina pectoris: Secondary | ICD-10-CM | POA: Insufficient documentation

## 2023-03-25 DIAGNOSIS — R0600 Dyspnea, unspecified: Secondary | ICD-10-CM | POA: Insufficient documentation

## 2023-03-25 LAB — ECHOCARDIOGRAM COMPLETE
Area-P 1/2: 2.83 cm2
S' Lateral: 2.6 cm

## 2023-03-26 DIAGNOSIS — Z6823 Body mass index (BMI) 23.0-23.9, adult: Secondary | ICD-10-CM | POA: Diagnosis not present

## 2023-03-26 DIAGNOSIS — Z01419 Encounter for gynecological examination (general) (routine) without abnormal findings: Secondary | ICD-10-CM | POA: Diagnosis not present

## 2023-03-26 DIAGNOSIS — Z1231 Encounter for screening mammogram for malignant neoplasm of breast: Secondary | ICD-10-CM | POA: Diagnosis not present

## 2023-04-01 ENCOUNTER — Encounter: Payer: Self-pay | Admitting: Acute Care

## 2023-04-15 DIAGNOSIS — H353211 Exudative age-related macular degeneration, right eye, with active choroidal neovascularization: Secondary | ICD-10-CM | POA: Diagnosis not present

## 2023-04-15 DIAGNOSIS — H401132 Primary open-angle glaucoma, bilateral, moderate stage: Secondary | ICD-10-CM | POA: Diagnosis not present

## 2023-04-15 DIAGNOSIS — H43813 Vitreous degeneration, bilateral: Secondary | ICD-10-CM | POA: Diagnosis not present

## 2023-04-15 DIAGNOSIS — H43393 Other vitreous opacities, bilateral: Secondary | ICD-10-CM | POA: Diagnosis not present

## 2023-04-15 DIAGNOSIS — H353122 Nonexudative age-related macular degeneration, left eye, intermediate dry stage: Secondary | ICD-10-CM | POA: Diagnosis not present

## 2023-06-03 DIAGNOSIS — H43393 Other vitreous opacities, bilateral: Secondary | ICD-10-CM | POA: Diagnosis not present

## 2023-06-03 DIAGNOSIS — H401132 Primary open-angle glaucoma, bilateral, moderate stage: Secondary | ICD-10-CM | POA: Diagnosis not present

## 2023-06-03 DIAGNOSIS — H353122 Nonexudative age-related macular degeneration, left eye, intermediate dry stage: Secondary | ICD-10-CM | POA: Diagnosis not present

## 2023-06-03 DIAGNOSIS — H353211 Exudative age-related macular degeneration, right eye, with active choroidal neovascularization: Secondary | ICD-10-CM | POA: Diagnosis not present

## 2023-06-03 DIAGNOSIS — H43813 Vitreous degeneration, bilateral: Secondary | ICD-10-CM | POA: Diagnosis not present

## 2023-06-10 DIAGNOSIS — H401131 Primary open-angle glaucoma, bilateral, mild stage: Secondary | ICD-10-CM | POA: Diagnosis not present

## 2023-06-12 DIAGNOSIS — J439 Emphysema, unspecified: Secondary | ICD-10-CM | POA: Diagnosis not present

## 2023-06-12 DIAGNOSIS — I700 Atherosclerosis of aorta: Secondary | ICD-10-CM | POA: Diagnosis not present

## 2023-06-12 DIAGNOSIS — I2510 Atherosclerotic heart disease of native coronary artery without angina pectoris: Secondary | ICD-10-CM | POA: Diagnosis not present

## 2023-06-12 DIAGNOSIS — N1831 Chronic kidney disease, stage 3a: Secondary | ICD-10-CM | POA: Diagnosis not present

## 2023-06-15 DIAGNOSIS — E785 Hyperlipidemia, unspecified: Secondary | ICD-10-CM | POA: Diagnosis not present

## 2023-06-15 DIAGNOSIS — I2510 Atherosclerotic heart disease of native coronary artery without angina pectoris: Secondary | ICD-10-CM | POA: Diagnosis not present

## 2023-06-15 DIAGNOSIS — N1831 Chronic kidney disease, stage 3a: Secondary | ICD-10-CM | POA: Diagnosis not present

## 2023-06-15 DIAGNOSIS — F322 Major depressive disorder, single episode, severe without psychotic features: Secondary | ICD-10-CM | POA: Diagnosis not present

## 2023-07-15 DIAGNOSIS — E785 Hyperlipidemia, unspecified: Secondary | ICD-10-CM | POA: Diagnosis not present

## 2023-07-15 DIAGNOSIS — I2510 Atherosclerotic heart disease of native coronary artery without angina pectoris: Secondary | ICD-10-CM | POA: Diagnosis not present

## 2023-07-15 DIAGNOSIS — J439 Emphysema, unspecified: Secondary | ICD-10-CM | POA: Diagnosis not present

## 2023-07-15 DIAGNOSIS — F322 Major depressive disorder, single episode, severe without psychotic features: Secondary | ICD-10-CM | POA: Diagnosis not present

## 2023-07-15 DIAGNOSIS — N1831 Chronic kidney disease, stage 3a: Secondary | ICD-10-CM | POA: Diagnosis not present

## 2023-07-15 DIAGNOSIS — I700 Atherosclerosis of aorta: Secondary | ICD-10-CM | POA: Diagnosis not present

## 2023-07-29 DIAGNOSIS — H353211 Exudative age-related macular degeneration, right eye, with active choroidal neovascularization: Secondary | ICD-10-CM | POA: Diagnosis not present

## 2023-07-29 DIAGNOSIS — H43813 Vitreous degeneration, bilateral: Secondary | ICD-10-CM | POA: Diagnosis not present

## 2023-07-29 DIAGNOSIS — J439 Emphysema, unspecified: Secondary | ICD-10-CM | POA: Diagnosis not present

## 2023-07-29 DIAGNOSIS — H353122 Nonexudative age-related macular degeneration, left eye, intermediate dry stage: Secondary | ICD-10-CM | POA: Diagnosis not present

## 2023-07-29 DIAGNOSIS — I2510 Atherosclerotic heart disease of native coronary artery without angina pectoris: Secondary | ICD-10-CM | POA: Diagnosis not present

## 2023-07-29 DIAGNOSIS — H401132 Primary open-angle glaucoma, bilateral, moderate stage: Secondary | ICD-10-CM | POA: Diagnosis not present

## 2023-07-29 DIAGNOSIS — I700 Atherosclerosis of aorta: Secondary | ICD-10-CM | POA: Diagnosis not present

## 2023-07-29 DIAGNOSIS — N1831 Chronic kidney disease, stage 3a: Secondary | ICD-10-CM | POA: Diagnosis not present

## 2023-07-29 DIAGNOSIS — H43393 Other vitreous opacities, bilateral: Secondary | ICD-10-CM | POA: Diagnosis not present

## 2023-07-30 ENCOUNTER — Other Ambulatory Visit: Payer: Self-pay | Admitting: Cardiovascular Disease

## 2023-08-28 DIAGNOSIS — I700 Atherosclerosis of aorta: Secondary | ICD-10-CM | POA: Diagnosis not present

## 2023-08-28 DIAGNOSIS — J439 Emphysema, unspecified: Secondary | ICD-10-CM | POA: Diagnosis not present

## 2023-08-28 DIAGNOSIS — I2510 Atherosclerotic heart disease of native coronary artery without angina pectoris: Secondary | ICD-10-CM | POA: Diagnosis not present

## 2023-08-28 DIAGNOSIS — N1831 Chronic kidney disease, stage 3a: Secondary | ICD-10-CM | POA: Diagnosis not present

## 2023-09-07 DIAGNOSIS — L03116 Cellulitis of left lower limb: Secondary | ICD-10-CM | POA: Diagnosis not present

## 2023-09-07 DIAGNOSIS — S81812A Laceration without foreign body, left lower leg, initial encounter: Secondary | ICD-10-CM | POA: Diagnosis not present

## 2023-09-10 DIAGNOSIS — H401131 Primary open-angle glaucoma, bilateral, mild stage: Secondary | ICD-10-CM | POA: Diagnosis not present

## 2023-09-14 DIAGNOSIS — I2510 Atherosclerotic heart disease of native coronary artery without angina pectoris: Secondary | ICD-10-CM | POA: Diagnosis not present

## 2023-09-14 DIAGNOSIS — I700 Atherosclerosis of aorta: Secondary | ICD-10-CM | POA: Diagnosis not present

## 2023-09-14 DIAGNOSIS — E785 Hyperlipidemia, unspecified: Secondary | ICD-10-CM | POA: Diagnosis not present

## 2023-09-14 DIAGNOSIS — J439 Emphysema, unspecified: Secondary | ICD-10-CM | POA: Diagnosis not present

## 2023-09-14 DIAGNOSIS — F322 Major depressive disorder, single episode, severe without psychotic features: Secondary | ICD-10-CM | POA: Diagnosis not present

## 2023-09-14 DIAGNOSIS — N1831 Chronic kidney disease, stage 3a: Secondary | ICD-10-CM | POA: Diagnosis not present

## 2023-09-23 DIAGNOSIS — H43393 Other vitreous opacities, bilateral: Secondary | ICD-10-CM | POA: Diagnosis not present

## 2023-09-23 DIAGNOSIS — H353122 Nonexudative age-related macular degeneration, left eye, intermediate dry stage: Secondary | ICD-10-CM | POA: Diagnosis not present

## 2023-09-23 DIAGNOSIS — H401132 Primary open-angle glaucoma, bilateral, moderate stage: Secondary | ICD-10-CM | POA: Diagnosis not present

## 2023-09-23 DIAGNOSIS — H43813 Vitreous degeneration, bilateral: Secondary | ICD-10-CM | POA: Diagnosis not present

## 2023-09-23 DIAGNOSIS — H353211 Exudative age-related macular degeneration, right eye, with active choroidal neovascularization: Secondary | ICD-10-CM | POA: Diagnosis not present

## 2023-09-25 DIAGNOSIS — L03116 Cellulitis of left lower limb: Secondary | ICD-10-CM | POA: Diagnosis not present

## 2023-09-27 DIAGNOSIS — N1831 Chronic kidney disease, stage 3a: Secondary | ICD-10-CM | POA: Diagnosis not present

## 2023-09-27 DIAGNOSIS — J439 Emphysema, unspecified: Secondary | ICD-10-CM | POA: Diagnosis not present

## 2023-09-27 DIAGNOSIS — I2510 Atherosclerotic heart disease of native coronary artery without angina pectoris: Secondary | ICD-10-CM | POA: Diagnosis not present

## 2023-09-27 DIAGNOSIS — I700 Atherosclerosis of aorta: Secondary | ICD-10-CM | POA: Diagnosis not present

## 2023-09-30 DIAGNOSIS — L03116 Cellulitis of left lower limb: Secondary | ICD-10-CM | POA: Diagnosis not present

## 2023-09-30 DIAGNOSIS — S81802D Unspecified open wound, left lower leg, subsequent encounter: Secondary | ICD-10-CM | POA: Diagnosis not present

## 2023-10-05 DIAGNOSIS — L03116 Cellulitis of left lower limb: Secondary | ICD-10-CM | POA: Diagnosis not present

## 2023-10-05 DIAGNOSIS — S81802D Unspecified open wound, left lower leg, subsequent encounter: Secondary | ICD-10-CM | POA: Diagnosis not present

## 2023-10-08 DIAGNOSIS — S81802D Unspecified open wound, left lower leg, subsequent encounter: Secondary | ICD-10-CM | POA: Diagnosis not present

## 2023-10-15 DIAGNOSIS — E785 Hyperlipidemia, unspecified: Secondary | ICD-10-CM | POA: Diagnosis not present

## 2023-10-15 DIAGNOSIS — I700 Atherosclerosis of aorta: Secondary | ICD-10-CM | POA: Diagnosis not present

## 2023-10-15 DIAGNOSIS — N1831 Chronic kidney disease, stage 3a: Secondary | ICD-10-CM | POA: Diagnosis not present

## 2023-10-15 DIAGNOSIS — F322 Major depressive disorder, single episode, severe without psychotic features: Secondary | ICD-10-CM | POA: Diagnosis not present

## 2023-10-15 DIAGNOSIS — I2510 Atherosclerotic heart disease of native coronary artery without angina pectoris: Secondary | ICD-10-CM | POA: Diagnosis not present

## 2023-10-15 DIAGNOSIS — J439 Emphysema, unspecified: Secondary | ICD-10-CM | POA: Diagnosis not present

## 2023-10-19 DIAGNOSIS — S81802D Unspecified open wound, left lower leg, subsequent encounter: Secondary | ICD-10-CM | POA: Diagnosis not present

## 2023-10-19 DIAGNOSIS — A498 Other bacterial infections of unspecified site: Secondary | ICD-10-CM | POA: Diagnosis not present

## 2023-11-11 DIAGNOSIS — L821 Other seborrheic keratosis: Secondary | ICD-10-CM | POA: Diagnosis not present

## 2023-11-11 DIAGNOSIS — L820 Inflamed seborrheic keratosis: Secondary | ICD-10-CM | POA: Diagnosis not present

## 2023-11-11 DIAGNOSIS — S81802A Unspecified open wound, left lower leg, initial encounter: Secondary | ICD-10-CM | POA: Diagnosis not present

## 2023-11-11 DIAGNOSIS — D692 Other nonthrombocytopenic purpura: Secondary | ICD-10-CM | POA: Diagnosis not present

## 2023-11-11 DIAGNOSIS — L814 Other melanin hyperpigmentation: Secondary | ICD-10-CM | POA: Diagnosis not present

## 2023-11-11 DIAGNOSIS — D225 Melanocytic nevi of trunk: Secondary | ICD-10-CM | POA: Diagnosis not present

## 2023-11-12 DIAGNOSIS — H353211 Exudative age-related macular degeneration, right eye, with active choroidal neovascularization: Secondary | ICD-10-CM | POA: Diagnosis not present

## 2023-11-12 DIAGNOSIS — H43813 Vitreous degeneration, bilateral: Secondary | ICD-10-CM | POA: Diagnosis not present

## 2023-11-12 DIAGNOSIS — H401132 Primary open-angle glaucoma, bilateral, moderate stage: Secondary | ICD-10-CM | POA: Diagnosis not present

## 2023-11-12 DIAGNOSIS — H43393 Other vitreous opacities, bilateral: Secondary | ICD-10-CM | POA: Diagnosis not present

## 2023-11-12 DIAGNOSIS — H353122 Nonexudative age-related macular degeneration, left eye, intermediate dry stage: Secondary | ICD-10-CM | POA: Diagnosis not present

## 2023-11-14 DIAGNOSIS — A498 Other bacterial infections of unspecified site: Secondary | ICD-10-CM | POA: Diagnosis not present

## 2023-11-14 DIAGNOSIS — T148XXA Other injury of unspecified body region, initial encounter: Secondary | ICD-10-CM | POA: Diagnosis not present

## 2023-11-14 DIAGNOSIS — S81802D Unspecified open wound, left lower leg, subsequent encounter: Secondary | ICD-10-CM | POA: Diagnosis not present

## 2023-11-15 DIAGNOSIS — I2510 Atherosclerotic heart disease of native coronary artery without angina pectoris: Secondary | ICD-10-CM | POA: Diagnosis not present

## 2023-11-15 DIAGNOSIS — E785 Hyperlipidemia, unspecified: Secondary | ICD-10-CM | POA: Diagnosis not present

## 2023-11-15 DIAGNOSIS — N1831 Chronic kidney disease, stage 3a: Secondary | ICD-10-CM | POA: Diagnosis not present

## 2023-11-15 DIAGNOSIS — F322 Major depressive disorder, single episode, severe without psychotic features: Secondary | ICD-10-CM | POA: Diagnosis not present

## 2023-11-25 DIAGNOSIS — N1831 Chronic kidney disease, stage 3a: Secondary | ICD-10-CM | POA: Diagnosis not present

## 2023-11-25 DIAGNOSIS — I2510 Atherosclerotic heart disease of native coronary artery without angina pectoris: Secondary | ICD-10-CM | POA: Diagnosis not present

## 2023-11-25 DIAGNOSIS — J439 Emphysema, unspecified: Secondary | ICD-10-CM | POA: Diagnosis not present

## 2023-11-25 DIAGNOSIS — I700 Atherosclerosis of aorta: Secondary | ICD-10-CM | POA: Diagnosis not present

## 2023-12-03 DIAGNOSIS — Z23 Encounter for immunization: Secondary | ICD-10-CM | POA: Diagnosis not present

## 2023-12-07 ENCOUNTER — Encounter (HOSPITAL_BASED_OUTPATIENT_CLINIC_OR_DEPARTMENT_OTHER): Attending: Internal Medicine | Admitting: Internal Medicine

## 2023-12-07 DIAGNOSIS — X58XXXA Exposure to other specified factors, initial encounter: Secondary | ICD-10-CM | POA: Diagnosis not present

## 2023-12-07 DIAGNOSIS — T798XXA Other early complications of trauma, initial encounter: Secondary | ICD-10-CM | POA: Insufficient documentation

## 2023-12-07 DIAGNOSIS — L97822 Non-pressure chronic ulcer of other part of left lower leg with fat layer exposed: Secondary | ICD-10-CM | POA: Diagnosis not present

## 2023-12-07 DIAGNOSIS — C4492 Squamous cell carcinoma of skin, unspecified: Secondary | ICD-10-CM | POA: Diagnosis not present

## 2023-12-08 DIAGNOSIS — T8189XA Other complications of procedures, not elsewhere classified, initial encounter: Secondary | ICD-10-CM | POA: Diagnosis not present

## 2023-12-08 DIAGNOSIS — L98499 Non-pressure chronic ulcer of skin of other sites with unspecified severity: Secondary | ICD-10-CM | POA: Diagnosis not present

## 2023-12-08 DIAGNOSIS — L308 Other specified dermatitis: Secondary | ICD-10-CM | POA: Diagnosis not present

## 2023-12-08 DIAGNOSIS — L738 Other specified follicular disorders: Secondary | ICD-10-CM | POA: Diagnosis not present

## 2023-12-08 DIAGNOSIS — D485 Neoplasm of uncertain behavior of skin: Secondary | ICD-10-CM | POA: Diagnosis not present

## 2023-12-15 DIAGNOSIS — E785 Hyperlipidemia, unspecified: Secondary | ICD-10-CM | POA: Diagnosis not present

## 2023-12-15 DIAGNOSIS — J439 Emphysema, unspecified: Secondary | ICD-10-CM | POA: Diagnosis not present

## 2023-12-15 DIAGNOSIS — I700 Atherosclerosis of aorta: Secondary | ICD-10-CM | POA: Diagnosis not present

## 2023-12-15 DIAGNOSIS — F322 Major depressive disorder, single episode, severe without psychotic features: Secondary | ICD-10-CM | POA: Diagnosis not present

## 2023-12-15 DIAGNOSIS — I2510 Atherosclerotic heart disease of native coronary artery without angina pectoris: Secondary | ICD-10-CM | POA: Diagnosis not present

## 2023-12-15 DIAGNOSIS — N1831 Chronic kidney disease, stage 3a: Secondary | ICD-10-CM | POA: Diagnosis not present

## 2023-12-25 DIAGNOSIS — N1831 Chronic kidney disease, stage 3a: Secondary | ICD-10-CM | POA: Diagnosis not present

## 2023-12-25 DIAGNOSIS — J439 Emphysema, unspecified: Secondary | ICD-10-CM | POA: Diagnosis not present

## 2023-12-25 DIAGNOSIS — I2510 Atherosclerotic heart disease of native coronary artery without angina pectoris: Secondary | ICD-10-CM | POA: Diagnosis not present

## 2023-12-25 DIAGNOSIS — I700 Atherosclerosis of aorta: Secondary | ICD-10-CM | POA: Diagnosis not present

## 2023-12-31 DIAGNOSIS — Z23 Encounter for immunization: Secondary | ICD-10-CM | POA: Diagnosis not present

## 2023-12-31 DIAGNOSIS — R7303 Prediabetes: Secondary | ICD-10-CM | POA: Diagnosis not present

## 2023-12-31 DIAGNOSIS — E559 Vitamin D deficiency, unspecified: Secondary | ICD-10-CM | POA: Diagnosis not present

## 2023-12-31 DIAGNOSIS — F418 Other specified anxiety disorders: Secondary | ICD-10-CM | POA: Diagnosis not present

## 2023-12-31 DIAGNOSIS — E785 Hyperlipidemia, unspecified: Secondary | ICD-10-CM | POA: Diagnosis not present

## 2023-12-31 DIAGNOSIS — Z0000 Encounter for general adult medical examination without abnormal findings: Secondary | ICD-10-CM | POA: Diagnosis not present

## 2023-12-31 DIAGNOSIS — Z87891 Personal history of nicotine dependence: Secondary | ICD-10-CM | POA: Diagnosis not present

## 2023-12-31 DIAGNOSIS — I2510 Atherosclerotic heart disease of native coronary artery without angina pectoris: Secondary | ICD-10-CM | POA: Diagnosis not present

## 2023-12-31 DIAGNOSIS — J439 Emphysema, unspecified: Secondary | ICD-10-CM | POA: Diagnosis not present

## 2024-01-01 ENCOUNTER — Other Ambulatory Visit (HOSPITAL_COMMUNITY): Payer: Self-pay | Admitting: Family Medicine

## 2024-01-01 ENCOUNTER — Encounter: Payer: Self-pay | Admitting: Family Medicine

## 2024-01-01 DIAGNOSIS — Z87891 Personal history of nicotine dependence: Secondary | ICD-10-CM

## 2024-01-05 DIAGNOSIS — H43813 Vitreous degeneration, bilateral: Secondary | ICD-10-CM | POA: Diagnosis not present

## 2024-01-05 DIAGNOSIS — H401132 Primary open-angle glaucoma, bilateral, moderate stage: Secondary | ICD-10-CM | POA: Diagnosis not present

## 2024-01-05 DIAGNOSIS — H43393 Other vitreous opacities, bilateral: Secondary | ICD-10-CM | POA: Diagnosis not present

## 2024-01-05 DIAGNOSIS — H353122 Nonexudative age-related macular degeneration, left eye, intermediate dry stage: Secondary | ICD-10-CM | POA: Diagnosis not present

## 2024-01-05 DIAGNOSIS — H353211 Exudative age-related macular degeneration, right eye, with active choroidal neovascularization: Secondary | ICD-10-CM | POA: Diagnosis not present

## 2024-01-15 DIAGNOSIS — I700 Atherosclerosis of aorta: Secondary | ICD-10-CM | POA: Diagnosis not present

## 2024-01-15 DIAGNOSIS — J439 Emphysema, unspecified: Secondary | ICD-10-CM | POA: Diagnosis not present

## 2024-01-15 DIAGNOSIS — E785 Hyperlipidemia, unspecified: Secondary | ICD-10-CM | POA: Diagnosis not present

## 2024-01-15 DIAGNOSIS — I2510 Atherosclerotic heart disease of native coronary artery without angina pectoris: Secondary | ICD-10-CM | POA: Diagnosis not present

## 2024-01-15 DIAGNOSIS — N1831 Chronic kidney disease, stage 3a: Secondary | ICD-10-CM | POA: Diagnosis not present

## 2024-01-15 DIAGNOSIS — F322 Major depressive disorder, single episode, severe without psychotic features: Secondary | ICD-10-CM | POA: Diagnosis not present

## 2024-01-24 DIAGNOSIS — J439 Emphysema, unspecified: Secondary | ICD-10-CM | POA: Diagnosis not present

## 2024-01-24 DIAGNOSIS — N1831 Chronic kidney disease, stage 3a: Secondary | ICD-10-CM | POA: Diagnosis not present

## 2024-01-24 DIAGNOSIS — I2510 Atherosclerotic heart disease of native coronary artery without angina pectoris: Secondary | ICD-10-CM | POA: Diagnosis not present

## 2024-01-24 DIAGNOSIS — I700 Atherosclerosis of aorta: Secondary | ICD-10-CM | POA: Diagnosis not present

## 2024-01-25 ENCOUNTER — Other Ambulatory Visit: Payer: Self-pay | Admitting: Cardiovascular Disease

## 2024-02-03 ENCOUNTER — Ambulatory Visit (HOSPITAL_COMMUNITY)
Admission: RE | Admit: 2024-02-03 | Discharge: 2024-02-03 | Disposition: A | Discharge status: Home / Self Care | Source: Ambulatory Visit | Attending: Family Medicine | Admitting: Family Medicine

## 2024-02-03 DIAGNOSIS — Z87891 Personal history of nicotine dependence: Secondary | ICD-10-CM | POA: Insufficient documentation

## 2024-02-04 DIAGNOSIS — R945 Abnormal results of liver function studies: Secondary | ICD-10-CM | POA: Diagnosis not present

## 2024-02-14 DIAGNOSIS — J439 Emphysema, unspecified: Secondary | ICD-10-CM | POA: Diagnosis not present

## 2024-02-14 DIAGNOSIS — I2510 Atherosclerotic heart disease of native coronary artery without angina pectoris: Secondary | ICD-10-CM | POA: Diagnosis not present

## 2024-02-14 DIAGNOSIS — N1831 Chronic kidney disease, stage 3a: Secondary | ICD-10-CM | POA: Diagnosis not present

## 2024-02-14 DIAGNOSIS — E785 Hyperlipidemia, unspecified: Secondary | ICD-10-CM | POA: Diagnosis not present

## 2024-02-14 DIAGNOSIS — F322 Major depressive disorder, single episode, severe without psychotic features: Secondary | ICD-10-CM | POA: Diagnosis not present

## 2024-02-14 DIAGNOSIS — I700 Atherosclerosis of aorta: Secondary | ICD-10-CM | POA: Diagnosis not present

## 2024-02-15 DIAGNOSIS — N958 Other specified menopausal and perimenopausal disorders: Secondary | ICD-10-CM | POA: Diagnosis not present

## 2024-02-15 DIAGNOSIS — M8588 Other specified disorders of bone density and structure, other site: Secondary | ICD-10-CM | POA: Diagnosis not present

## 2024-03-01 DIAGNOSIS — H43813 Vitreous degeneration, bilateral: Secondary | ICD-10-CM | POA: Diagnosis not present

## 2024-03-01 DIAGNOSIS — H353211 Exudative age-related macular degeneration, right eye, with active choroidal neovascularization: Secondary | ICD-10-CM | POA: Diagnosis not present

## 2024-03-01 DIAGNOSIS — H401132 Primary open-angle glaucoma, bilateral, moderate stage: Secondary | ICD-10-CM | POA: Diagnosis not present

## 2024-03-01 DIAGNOSIS — H353122 Nonexudative age-related macular degeneration, left eye, intermediate dry stage: Secondary | ICD-10-CM | POA: Diagnosis not present

## 2024-03-01 DIAGNOSIS — H43393 Other vitreous opacities, bilateral: Secondary | ICD-10-CM | POA: Diagnosis not present

## 2024-03-29 ENCOUNTER — Other Ambulatory Visit: Payer: Self-pay | Admitting: Cardiovascular Disease

## 2024-04-22 ENCOUNTER — Other Ambulatory Visit: Payer: Self-pay | Admitting: Cardiovascular Disease
